# Patient Record
Sex: Female | Born: 1960 | Race: Asian | Hispanic: No | Smoking: Never smoker
Health system: Southern US, Community
[De-identification: ages and names within clinical notes are randomized; demographics above are authoritative.]

## PROBLEM LIST (undated history)

## (undated) DIAGNOSIS — M329 Systemic lupus erythematosus, unspecified: Secondary | ICD-10-CM

## (undated) DIAGNOSIS — E119 Type 2 diabetes mellitus without complications: Secondary | ICD-10-CM

## (undated) DIAGNOSIS — I10 Essential (primary) hypertension: Secondary | ICD-10-CM

## (undated) DIAGNOSIS — G500 Trigeminal neuralgia: Secondary | ICD-10-CM

## (undated) DIAGNOSIS — M1990 Unspecified osteoarthritis, unspecified site: Secondary | ICD-10-CM

## (undated) HISTORY — DX: Systemic lupus erythematosus, unspecified: M32.9

## (undated) HISTORY — DX: Trigeminal neuralgia: G50.0

## (undated) HISTORY — DX: Unspecified osteoarthritis, unspecified site: M19.90

## (undated) HISTORY — DX: Essential (primary) hypertension: I10

## (undated) HISTORY — DX: Type 2 diabetes mellitus without complications: E11.9

---

## 2010-01-21 ENCOUNTER — Encounter: Admission: RE | Admit: 2010-01-21 | Discharge: 2010-01-21 | Payer: Self-pay | Admitting: Cardiovascular Disease

## 2011-07-07 ENCOUNTER — Other Ambulatory Visit: Payer: Self-pay | Admitting: Cardiovascular Disease

## 2011-07-07 ENCOUNTER — Ambulatory Visit
Admission: RE | Admit: 2011-07-07 | Discharge: 2011-07-07 | Disposition: A | Discharge status: Home / Self Care | Payer: No Typology Code available for payment source | Source: Ambulatory Visit | Attending: Cardiovascular Disease | Admitting: Cardiovascular Disease

## 2011-07-07 DIAGNOSIS — R05 Cough: Secondary | ICD-10-CM

## 2011-07-07 DIAGNOSIS — R059 Cough, unspecified: Secondary | ICD-10-CM

## 2014-11-30 ENCOUNTER — Ambulatory Visit (INDEPENDENT_AMBULATORY_CARE_PROVIDER_SITE_OTHER): Payer: 59 | Admitting: Nurse Practitioner

## 2014-11-30 ENCOUNTER — Encounter: Payer: Self-pay | Admitting: Nurse Practitioner

## 2014-11-30 VITALS — BP 128/86 | HR 91 | Temp 97.5°F | Resp 16 | Ht 60.0 in | Wt 199.2 lb

## 2014-11-30 DIAGNOSIS — I10 Essential (primary) hypertension: Secondary | ICD-10-CM

## 2014-11-30 DIAGNOSIS — M25462 Effusion, left knee: Secondary | ICD-10-CM

## 2014-11-30 DIAGNOSIS — E1140 Type 2 diabetes mellitus with diabetic neuropathy, unspecified: Secondary | ICD-10-CM

## 2014-11-30 MED ORDER — TRAMADOL HCL 50 MG PO TABS: 50.0000 mg | ORAL_TABLET | Freq: Four times a day (QID) | ORAL | Status: DC | PRN

## 2014-11-30 NOTE — Patient Instructions (Signed)
Can make your own appt and call and let us know when it is  Follow up in 1 month for EV

## 2014-11-30 NOTE — Progress Notes (Signed)
Patient ID: Victoria Cox, female   DOB: Dec 04, 1960, 54 y.o.   MRN: [id number]    PCP: Sharon Seller, NP  No Known Allergies  Chief Complaint  Patient presents with  . Establish Care    New Patient Establish Care, left knee pain x 2 weeks. Seen at [facility] 11/22/14 , started prednisone x 5 days- completed. No know injury for knee pain. Patient would like referral to specialist  . Labs Only    Fasting for any labs due, patient with DM and HTN      HPI: Patient is a 54 y.o. female seen in the office today to establish care. Pt has lived in the [place] for 14 years, goes back and forth to [place] about every 6 months -1 year and will see a physician there. Getting prescription medications from [place] from family when they visit.   Hx of diabetes, htn, knee pain Fasting today for blood work Pt does not know her family hx but reports her brother has RA   Pt with knee pain, left knee, increased pain 2 weeks ago. no injury noted. Had hx of pain but it was on and off, but 2 weeks was more severe. Went to urgent care and found effusion. 5 day of prednisone given but still with increased pain and swelling.  No fevers   Here with daughter-in-law who reports they have been doing rest, elevation, compression and ice-- she is a Physical therapist   Advanced Directive information Does patient have an advance directive?: No, Would patient like information on creating an advanced directive?: Yes - Educational materials given Review of Systems:  Review of Systems  Constitutional: Negative for activity change, appetite change, fatigue and unexpected weight change.  HENT: Negative for congestion and hearing loss.   Eyes: Negative.   Respiratory: Negative for cough and shortness of breath.   Cardiovascular: Negative for chest pain, palpitations and leg swelling.  Gastrointestinal: Negative for abdominal pain, diarrhea and constipation.  Genitourinary: Negative for dysuria and difficulty urinating.    Musculoskeletal: Positive for joint swelling and arthralgias. Negative for myalgias.  Skin: Negative for color change and wound.  Neurological: Negative for dizziness, weakness, light-headedness and headaches.  Psychiatric/Behavioral: Negative for sleep disturbance and agitation. The patient is not nervous/anxious.     Past Medical History  Diagnosis Date  . High blood pressure   . Diabetes   . Arthritis    History reviewed. No pertinent past surgical history. Social History:   reports that she has never smoked. She does not have any smokeless tobacco history on file. She reports that she does not drink alcohol or use illicit drugs.  Family History  Problem Relation Age of Onset  . Hypertension Mother     Medications: Patient's Medications  New Prescriptions   No medications on file  Previous Medications   ACETAMINOPHEN (TYLENOL) 500 MG TABLET    Take 500 mg by mouth as needed.   IBUPROFEN (ADVIL,MOTRIN) 200 MG TABLET    Take 200 mg by mouth every 6 (six) hours as needed.   METFORMIN HCL PO    Take by mouth. Metformin hydrochloride 250 mg, 1 by mouth in the am, 1 by mouth in the pm   MULTIPLE VITAMIN (MULTIVITAMIN WITH MINERALS) TABS TABLET    Take 1 tablet by mouth daily.   TELMISARTAN (MICARDIS) 20 MG TABLET    Take 20 mg by mouth daily.  Modified Medications   No medications on file  Discontinued Medications   No medications  on file     Physical Exam:  Filed Vitals:   11/30/14 0903  BP: 128/86  Pulse: 91  Temp: 97.5 F (36.4 C)  TempSrc: Oral  Resp: 16  Height: 5' (1.524 m)  Weight: 199 lb 3.2 oz (90.357 kg)  SpO2: 98%    Physical Exam  Constitutional: She is oriented to person, place, and time. She appears well-developed and well-nourished. No distress.  HENT:  Head: Normocephalic and atraumatic.  Mouth/Throat: Oropharynx is clear and moist. No oropharyngeal exudate.  Eyes: Conjunctivae are normal. Pupils are equal, round, and reactive to light.  Neck:  Normal range of motion. Neck supple.  Cardiovascular: Normal rate, regular rhythm and normal heart sounds.   Pulmonary/Chest: Effort normal and breath sounds normal.  Abdominal: Soft. Bowel sounds are normal.  Musculoskeletal: Normal range of motion.       Left knee: She exhibits swelling and effusion. She exhibits normal range of motion, no ecchymosis and no erythema.  Neurological: She is alert and oriented to person, place, and time.  Skin: Skin is warm and dry. She is not diaphoretic.  Psychiatric: She has a normal mood and affect.    Labs reviewed: Basic Metabolic Panel: No results for input(s): NA, K, CL, CO2, GLUCOSE, BUN, CREATININE, CALCIUM, MG, PHOS, TSH in the last 8760 hours. Liver Function Tests: No results for input(s): AST, ALT, ALKPHOS, BILITOT, PROT, ALBUMIN in the last 8760 hours. No results for input(s): LIPASE, AMYLASE in the last 8760 hours. No results for input(s): AMMONIA in the last 8760 hours. CBC: No results for input(s): WBC, NEUTROABS, HGB, HCT, MCV, PLT in the last 8760 hours. Lipid Panel: No results for input(s): CHOL, HDL, LDLCALC, TRIG, CHOLHDL, LDLDIRECT in the last 8760 hours. TSH: No results for input(s): TSH in the last 8760 hours. A1C: No results found for: HGBA1C   Assessment/Plan 1. Knee effusion, left -was seen in urgent care last week due to increase in pain, xray reveal effusion and urgent care recommended ortho referral, prednisone not effective  -may cont aleve twice daily and use ultram for break through pain - Ambulatory referral to Orthopedics - traMADol (ULTRAM) 50 MG tablet; Take 1 tablet (50 mg total) by mouth every 6 (six) hours as needed.  Dispense: 30 tablet; Refill: 0  2. HTN (hypertension), benign -blood pressure at goal today, will follow up labs -to cont telmisartan daily  - Comprehensive metabolic panel - Lipid panel - CBC with Differential/Platelet  3. Type 2 diabetes mellitus with diabetic neuropathy -on metformin  250 mg twice daily - will get Hemoglobin A1c today   Follow up in 1 month for EV with EKG and PAP

## 2014-12-01 LAB — CBC WITH DIFFERENTIAL/PLATELET
BASOS: 0 %
Basophils Absolute: 0 10*3/uL (ref 0.0–0.2)
EOS (ABSOLUTE): 0.2 10*3/uL (ref 0.0–0.4)
EOS: 2 %
HEMATOCRIT: 38.6 % (ref 34.0–46.6)
Hemoglobin: 12.5 g/dL (ref 11.1–15.9)
Immature Grans (Abs): 0 10*3/uL (ref 0.0–0.1)
Immature Granulocytes: 0 %
LYMPHS: 28 %
Lymphocytes Absolute: 2.2 10*3/uL (ref 0.7–3.1)
MCH: 27 pg (ref 26.6–33.0)
MCHC: 32.4 g/dL (ref 31.5–35.7)
MCV: 83 fL (ref 79–97)
MONOS ABS: 0.4 10*3/uL (ref 0.1–0.9)
Monocytes: 5 %
NEUTROS ABS: 5.2 10*3/uL (ref 1.4–7.0)
Neutrophils: 65 %
Platelets: 369 10*3/uL (ref 150–379)
RBC: 4.63 x10E6/uL (ref 3.77–5.28)
RDW: 14.1 % (ref 12.3–15.4)
WBC: 8.1 10*3/uL (ref 3.4–10.8)

## 2014-12-01 LAB — COMPREHENSIVE METABOLIC PANEL
ALBUMIN: 4.2 g/dL (ref 3.5–5.5)
ALK PHOS: 108 IU/L (ref 39–117)
ALT: 36 IU/L — AB (ref 0–32)
AST: 21 IU/L (ref 0–40)
Albumin/Globulin Ratio: 1.4 (ref 1.1–2.5)
BUN/Creatinine Ratio: 19 (ref 9–23)
BUN: 11 mg/dL (ref 6–24)
Bilirubin Total: 0.2 mg/dL (ref 0.0–1.2)
CALCIUM: 9.6 mg/dL (ref 8.7–10.2)
CHLORIDE: 99 mmol/L (ref 97–108)
CO2: 23 mmol/L (ref 18–29)
Creatinine, Ser: 0.59 mg/dL (ref 0.57–1.00)
GFR calc Af Amer: 119 mL/min/{1.73_m2} (ref >59)
GFR calc non Af Amer: 104 mL/min/{1.73_m2} (ref >59)
GLUCOSE: 147 mg/dL — AB (ref 65–99)
Globulin, Total: 3 g/dL (ref 1.5–4.5)
POTASSIUM: 4.5 mmol/L (ref 3.5–5.2)
Sodium: 139 mmol/L (ref 134–144)
TOTAL PROTEIN: 7.2 g/dL (ref 6.0–8.5)

## 2014-12-01 LAB — LIPID PANEL
CHOL/HDL RATIO: 3.5 ratio (ref 0.0–4.4)
Cholesterol, Total: 186 mg/dL (ref 100–199)
HDL: 53 mg/dL (ref >39)
LDL CALC: 69 mg/dL (ref 0–99)
TRIGLYCERIDES: 322 mg/dL — AB (ref 0–149)
VLDL Cholesterol Cal: 64 mg/dL — ABNORMAL HIGH (ref 5–40)

## 2014-12-01 LAB — HEMOGLOBIN A1C
ESTIMATED AVERAGE GLUCOSE: 206 mg/dL
Hgb A1c MFr Bld: 8.8 % — ABNORMAL HIGH (ref 4.8–5.6)

## 2014-12-08 ENCOUNTER — Other Ambulatory Visit: Payer: Self-pay | Admitting: *Deleted

## 2014-12-08 MED ORDER — FREESTYLE LANCETS MISC: Status: AC

## 2014-12-08 MED ORDER — METFORMIN HCL 500 MG PO TABS: ORAL_TABLET | ORAL | Status: DC

## 2014-12-08 MED ORDER — GLUCOSE BLOOD VI STRP: ORAL_STRIP | Status: AC

## 2014-12-08 NOTE — Telephone Encounter (Signed)
Given patient Freestyle Lite Blood sugar machine and instructed on how to use it. Faxed in supplies to pharmacy.

## 2014-12-08 NOTE — Telephone Encounter (Signed)
Per Shanda Bumps due to labs called into pharmacy.

## 2014-12-11 ENCOUNTER — Other Ambulatory Visit: Payer: Self-pay | Admitting: Orthopaedic Surgery

## 2014-12-11 DIAGNOSIS — M25562 Pain in left knee: Secondary | ICD-10-CM

## 2014-12-23 ENCOUNTER — Ambulatory Visit
Admission: RE | Admit: 2014-12-23 | Discharge: 2014-12-23 | Disposition: A | Discharge status: Home / Self Care | Payer: Self-pay | Source: Ambulatory Visit | Attending: Orthopaedic Surgery | Admitting: Orthopaedic Surgery

## 2014-12-23 DIAGNOSIS — M25562 Pain in left knee: Secondary | ICD-10-CM

## 2015-01-01 ENCOUNTER — Other Ambulatory Visit (HOSPITAL_COMMUNITY): Payer: Self-pay | Admitting: *Deleted

## 2015-01-01 DIAGNOSIS — M79662 Pain in left lower leg: Secondary | ICD-10-CM

## 2015-01-01 DIAGNOSIS — M7989 Other specified soft tissue disorders: Principal | ICD-10-CM

## 2015-01-02 ENCOUNTER — Ambulatory Visit (HOSPITAL_COMMUNITY): Admission: RE | Admit: 2015-01-02 | Payer: 59 | Source: Ambulatory Visit

## 2015-01-02 ENCOUNTER — Encounter (HOSPITAL_COMMUNITY): Payer: Self-pay

## 2015-01-09 ENCOUNTER — Ambulatory Visit (INDEPENDENT_AMBULATORY_CARE_PROVIDER_SITE_OTHER): Payer: 59 | Admitting: Nurse Practitioner

## 2015-01-09 ENCOUNTER — Encounter: Payer: Self-pay | Admitting: Nurse Practitioner

## 2015-01-09 VITALS — BP 126/86 | HR 96 | Temp 97.5°F | Resp 18 | Ht 60.0 in | Wt 195.2 lb

## 2015-01-09 DIAGNOSIS — Z0000 Encounter for general adult medical examination without abnormal findings: Secondary | ICD-10-CM | POA: Diagnosis not present

## 2015-01-09 DIAGNOSIS — E119 Type 2 diabetes mellitus without complications: Secondary | ICD-10-CM | POA: Diagnosis not present

## 2015-01-09 DIAGNOSIS — Z1239 Encounter for other screening for malignant neoplasm of breast: Secondary | ICD-10-CM | POA: Diagnosis not present

## 2015-01-09 DIAGNOSIS — Z124 Encounter for screening for malignant neoplasm of cervix: Secondary | ICD-10-CM

## 2015-01-09 MED ORDER — TETANUS-DIPHTH-ACELL PERTUSSIS 5-2-15.5 LF-MCG/0.5 IM SUSP: 0.5000 mL | Freq: Once | INTRAMUSCULAR | Status: DC

## 2015-01-09 MED ORDER — METFORMIN HCL 1000 MG PO TABS: ORAL_TABLET | ORAL | Status: DC

## 2015-01-09 NOTE — Patient Instructions (Addendum)
To follow up in 3 months with lab work prior to appt   Increase metformin to 1000 mg in the am, 500 mg at dinner for 1 week then increase metformin to 1000 mg twice daily for diabetes  Cont diabetic diet  Health Maintenance Adopting a healthy lifestyle and getting preventive care can go a long way to promote health and wellness. Talk with your health care provider about what schedule of regular examinations is right for you. This is a good chance for you to check in with your provider about disease prevention and staying healthy. In between checkups, there are plenty of things you can do on your own. Experts have done a lot of research about which lifestyle changes and preventive measures are most likely to keep you healthy. Ask your health care provider for more information. WEIGHT AND DIET  Eat a healthy diet  Be sure to include plenty of vegetables, fruits, low-fat dairy products, and lean protein.  Do not eat a lot of foods high in solid fats, added sugars, or salt.  Get regular exercise. This is one of the most important things you can do for your health.  Most adults should exercise for at least 150 minutes each week. The exercise should increase your heart rate and make you sweat (moderate-intensity exercise).  Most adults should also do strengthening exercises at least twice a week. This is in addition to the moderate-intensity exercise.  Maintain a healthy weight  Body mass index (BMI) is a measurement that can be used to identify possible weight problems. It estimates body fat based on height and weight. Your health care provider can help determine your BMI and help you achieve or maintain a healthy weight.  For females 53 years of age and older:   A BMI below 18.5 is considered underweight.  A BMI of 18.5 to 24.9 is normal.  A BMI of 25 to 29.9 is considered overweight.  A BMI of 30 and above is considered obese.  Watch levels of cholesterol and blood lipids  You  should start having your blood tested for lipids and cholesterol at 55 years of age, then have this test every 5 years.  You may need to have your cholesterol levels checked more often if:  Your lipid or cholesterol levels are high.  You are older than 55 years of age.  You are at high risk for heart disease.  CANCER SCREENING   Lung Cancer  Lung cancer screening is recommended for adults 48-70 years old who are at high risk for lung cancer because of a history of smoking.  A yearly low-dose CT scan of the lungs is recommended for people who:  Currently smoke.  Have quit within the past 15 years.  Have at least a 30-pack-year history of smoking. A pack year is smoking an average of one pack of cigarettes a day for 1 year.  Yearly screening should continue until it has been 15 years since you quit.  Yearly screening should stop if you develop a health problem that would prevent you from having lung cancer treatment.  Breast Cancer  Practice breast self-awareness. This means understanding how your breasts normally appear and feel.  It also means doing regular breast self-exams. Let your health care provider know about any changes, no matter how small.  If you are in your 20s or 30s, you should have a clinical breast exam (CBE) by a health care provider every 1-3 years as part of a regular health exam.  If you are 40 or older, have a CBE every year. Also consider having a breast X-ray (mammogram) every year.  If you have a family history of breast cancer, talk to your health care provider about genetic screening.  If you are at high risk for breast cancer, talk to your health care provider about having an MRI and a mammogram every year.  Breast cancer gene (BRCA) assessment is recommended for women who have family members with BRCA-related cancers. BRCA-related cancers include:  Breast.  Ovarian.  Tubal.  Peritoneal cancers.  Results of the assessment will determine  the need for genetic counseling and BRCA1 and BRCA2 testing. Cervical Cancer Routine pelvic examinations to screen for cervical cancer are no longer recommended for nonpregnant women who are considered low risk for cancer of the pelvic organs (ovaries, uterus, and vagina) and who do not have symptoms. A pelvic examination may be necessary if you have symptoms including those associated with pelvic infections. Ask your health care provider if a screening pelvic exam is right for you.   The Pap test is the screening test for cervical cancer for women who are considered at risk.  If you had a hysterectomy for a problem that was not cancer or a condition that could lead to cancer, then you no longer need Pap tests.  If you are older than 65 years, and you have had normal Pap tests for the past 10 years, you no longer need to have Pap tests.  If you have had past treatment for cervical cancer or a condition that could lead to cancer, you need Pap tests and screening for cancer for at least 20 years after your treatment.  If you no longer get a Pap test, assess your risk factors if they change (such as having a new sexual partner). This can affect whether you should start being screened again.  Some women have medical problems that increase their chance of getting cervical cancer. If this is the case for you, your health care provider may recommend more frequent screening and Pap tests.  The human papillomavirus (HPV) test is another test that may be used for cervical cancer screening. The HPV test looks for the virus that can cause cell changes in the cervix. The cells collected during the Pap test can be tested for HPV.  The HPV test can be used to screen women 59 years of age and older. Getting tested for HPV can extend the interval between normal Pap tests from three to five years.  An HPV test also should be used to screen women of any age who have unclear Pap test results.  After 55 years of  age, women should have HPV testing as often as Pap tests.  Colorectal Cancer  This type of cancer can be detected and often prevented.  Routine colorectal cancer screening usually begins at 55 years of age and continues through 55 years of age.  Your health care provider may recommend screening at an earlier age if you have risk factors for colon cancer.  Your health care provider may also recommend using home test kits to check for hidden blood in the stool.  A small camera at the end of a tube can be used to examine your colon directly (sigmoidoscopy or colonoscopy). This is done to check for the earliest forms of colorectal cancer.  Routine screening usually begins at age 21.  Direct examination of the colon should be repeated every 5-10 years through 55 years of age. However,  you may need to be screened more often if early forms of precancerous polyps or small growths are found. Skin Cancer  Check your skin from head to toe regularly.  Tell your health care provider about any new moles or changes in moles, especially if there is a change in a mole's shape or color.  Also tell your health care provider if you have a mole that is larger than the size of a pencil eraser.  Always use sunscreen. Apply sunscreen liberally and repeatedly throughout the day.  Protect yourself by wearing long sleeves, pants, a wide-brimmed hat, and sunglasses whenever you are outside. HEART DISEASE, DIABETES, AND HIGH BLOOD PRESSURE   Have your blood pressure checked at least every 1-2 years. High blood pressure causes heart disease and increases the risk of stroke.  If you are between 57 years and 109 years old, ask your health care provider if you should take aspirin to prevent strokes.  Have regular diabetes screenings. This involves taking a blood sample to check your fasting blood sugar level.  If you are at a normal weight and have a low risk for diabetes, have this test once every three years  after 55 years of age.  If you are overweight and have a high risk for diabetes, consider being tested at a younger age or more often. PREVENTING INFECTION  Hepatitis B  If you have a higher risk for hepatitis B, you should be screened for this virus. You are considered at high risk for hepatitis B if:  You were born in a country where hepatitis B is common. Ask your health care provider which countries are considered high risk.  Your parents were born in a high-risk country, and you have not been immunized against hepatitis B (hepatitis B vaccine).  You have HIV or AIDS.  You use needles to inject street drugs.  You live with someone who has hepatitis B.  You have had sex with someone who has hepatitis B.  You get hemodialysis treatment.  You take certain medicines for conditions, including cancer, organ transplantation, and autoimmune conditions. Hepatitis C  Blood testing is recommended for:  Everyone born from [birth year] through 1966.  Anyone with known risk factors for hepatitis C. Sexually transmitted infections (STIs)  You should be screened for sexually transmitted infections (STIs) including gonorrhea and chlamydia if:  You are sexually active and are younger than 55 years of age.  You are older than 55 years of age and your health care provider tells you that you are at risk for this type of infection.  Your sexual activity has changed since you were last screened and you are at an increased risk for chlamydia or gonorrhea. Ask your health care provider if you are at risk.  If you do not have HIV, but are at risk, it may be recommended that you take a prescription medicine daily to prevent HIV infection. This is called pre-exposure prophylaxis (PrEP). You are considered at risk if:  You are sexually active and do not regularly use condoms or know the HIV status of your partner(s).  You take drugs by injection.  You are sexually active with a partner who has  HIV. Talk with your health care provider about whether you are at high risk of being infected with HIV. If you choose to begin PrEP, you should first be tested for HIV. You should then be tested every 3 months for as long as you are taking PrEP.  PREGNANCY   If  you are premenopausal and you may become pregnant, ask your health care provider about preconception counseling.  If you may become pregnant, take 400 to 800 micrograms (mcg) of folic acid every day.  If you want to prevent pregnancy, talk to your health care provider about birth control (contraception). OSTEOPOROSIS AND MENOPAUSE   Osteoporosis is a disease in which the bones lose minerals and strength with aging. This can result in serious bone fractures. Your risk for osteoporosis can be identified using a bone density scan.  If you are 41 years of age or older, or if you are at risk for osteoporosis and fractures, ask your health care provider if you should be screened.  Ask your health care provider whether you should take a calcium or vitamin D supplement to lower your risk for osteoporosis.  Menopause may have certain physical symptoms and risks.  Hormone replacement therapy may reduce some of these symptoms and risks. Talk to your health care provider about whether hormone replacement therapy is right for you.  HOME CARE INSTRUCTIONS   Schedule regular health, dental, and eye exams.  Stay current with your immunizations.   Do not use any tobacco products including cigarettes, chewing tobacco, or electronic cigarettes.  If you are pregnant, do not drink alcohol.  If you are breastfeeding, limit how much and how often you drink alcohol.  Limit alcohol intake to no more than 1 drink per day for nonpregnant women. One drink equals 12 ounces of beer, 5 ounces of wine, or 1 ounces of hard liquor.  Do not use street drugs.  Do not share needles.  Ask your health care provider for help if you need support or  information about quitting drugs.  Tell your health care provider if you often feel depressed.  Tell your health care provider if you have ever been abused or do not feel safe at home. Document Released: 01/27/2011 Document Revised: 11/28/2013 Document Reviewed: 06/15/2013 [facility] Patient Information 2015 [place], [place]. This information is not intended to replace advice given to you by your health care provider. Make sure you discuss any questions you have with your health care provider.

## 2015-01-09 NOTE — Progress Notes (Signed)
Patient ID: Victoria Cox, female   DOB: 12-Jul-1961, 54 y.o.   MRN: [id number]    PCP: Sharon Seller, NP  No Known Allergies  Chief Complaint  Patient presents with  . Annual Exam    Annual exam     HPI: Patient is a 54 y.o. female seen in the office today to for wellness exam.  Has gone to ortho, fount to have chronic ACL tear, meniscus tear and OA. Given cortisone shot which helped with the pain. Tramadol made her nauseated, taking tylenol   Taking blood sugars at home, fasting between 130-140.  Been trying to be more complaint with diabetic diet.  No low blood sugars noted.    Screenings: Colon Cancer- never had screening  Breast Cancer- never had mammogram  Cervical Cancer- never had, needs, willing to get today   Vaccines Up to date on:  Gets influenza yearly ,  Need:  Tdap  Smoking status: nonsmoker Alcohol use: does not drink alcohol  Dentist: does not go regularly  Ophthalmologist: has not gone due to coverage   Exercise regimen: limited due to knee pain.  Diet: diabetic   Review of Systems:  Review of Systems  Constitutional: Negative for activity change, appetite change, fatigue and unexpected weight change.  HENT: Negative for congestion and hearing loss.   Eyes: Negative.   Respiratory: Negative for cough and shortness of breath.   Cardiovascular: Negative for chest pain, palpitations and leg swelling.  Gastrointestinal: Negative for abdominal pain, diarrhea and constipation.  Endocrine: Negative.   Genitourinary: Negative for dysuria and difficulty urinating.  Musculoskeletal: Positive for joint swelling and arthralgias. Negative for myalgias.  Skin: Negative for color change and wound.  Allergic/Immunologic: Negative.   Neurological: Negative for dizziness, weakness, light-headedness and headaches.  Psychiatric/Behavioral: Negative for sleep disturbance and agitation. The patient is not nervous/anxious.     Past Medical History  Diagnosis  Date  . High blood pressure   . Diabetes   . Arthritis    History reviewed. No pertinent past surgical history. Social History:   reports that she has never smoked. She has never used smokeless tobacco. She reports that she does not drink alcohol or use illicit drugs.  Family History  Problem Relation Age of Onset  . Hypertension Mother     Medications: Patient's Medications  New Prescriptions   No medications on file  Previous Medications   ACETAMINOPHEN (TYLENOL) 500 MG TABLET    Take 500 mg by mouth as needed.   GLUCOSE BLOOD (FREESTYLE LITE) TEST STRIP    Use to test blood sugar twice daily Dx: E11.9   IBUPROFEN (ADVIL,MOTRIN) 200 MG TABLET    Take 200 mg by mouth every 6 (six) hours as needed.   LANCETS (FREESTYLE) LANCETS    Use to test blood sugar twice daily. Dx: E11.9   METFORMIN (GLUCOPHAGE) 500 MG TABLET    Take one tablet by mouth twice daily with meals to control blood sugar   MULTIPLE VITAMIN (MULTIVITAMIN WITH MINERALS) TABS TABLET    Take 1 tablet by mouth daily.   TELMISARTAN (MICARDIS) 20 MG TABLET    Take 20 mg by mouth daily.   TRAMADOL (ULTRAM) 50 MG TABLET    Take 1 tablet (50 mg total) by mouth every 6 (six) hours as needed.  Modified Medications   No medications on file  Discontinued Medications   No medications on file     Physical Exam:  Filed Vitals:   01/09/15 0904  BP: 126/86  Pulse: 96  Temp: 97.5 F (36.4 C)  TempSrc: Oral  Resp: 18  Height: 5' (1.524 m)  Weight: 195 lb 3.2 oz (88.542 kg)  SpO2: 95%    Physical Exam  Constitutional: She is oriented to person, place, and time. She appears well-developed and well-nourished. No distress.  HENT:  Head: Normocephalic and atraumatic.  Mouth/Throat: Oropharynx is clear and moist. No oropharyngeal exudate.  Eyes: Conjunctivae are normal. Pupils are equal, round, and reactive to light.  Neck: Normal range of motion. Neck supple.  Cardiovascular: Normal rate, regular rhythm and normal heart  sounds.   Pulmonary/Chest: Effort normal and breath sounds normal.  Abdominal: Soft. Bowel sounds are normal.  Genitourinary: Vagina normal and uterus normal. Cervix exhibits friability. Right adnexum displays no mass. Left adnexum displays no mass. No vaginal discharge found.  Musculoskeletal: Normal range of motion.  Neurological: She is alert and oriented to person, place, and time. She has normal reflexes.  Skin: Skin is warm and dry. She is not diaphoretic.  Psychiatric: She has a normal mood and affect. Her behavior is normal.    Labs reviewed: Basic Metabolic Panel:  Recent Labs  70/96/28 1001  NA 139  K 4.5  CL 99  CO2 23  GLUCOSE 147*  BUN 11  CREATININE 0.59  CALCIUM 9.6   Liver Function Tests:  Recent Labs  11/30/14 1001  AST 21  ALT 36*  ALKPHOS 108  BILITOT 0.2  PROT 7.2   No results for input(s): LIPASE, AMYLASE in the last 8760 hours. No results for input(s): AMMONIA in the last 8760 hours. CBC:  Recent Labs  11/30/14 1001  WBC 8.1  NEUTROABS 5.2  HCT 38.6   Lipid Panel:  Recent Labs  11/30/14 1001  CHOL 186  HDL 53  LDLCALC 69  TRIG 322*  CHOLHDL 3.5   TSH: No results for input(s): TSH in the last 8760 hours. A1C: Lab Results  Component Value Date   HGBA1C 8.8* 11/30/2014     Assessment/Plan  1. Type 2 diabetes mellitus without complication -discussed diet and exercise - Ambulatory referral to Ophthalmology - Hemoglobin A1c; Future - Basic metabolic panel; Future - metFORMIN (GLUCOPHAGE) 1000 MG tablet; Take one tablet by mouth twice daily with meals to control blood sugar  Dispense: 60 tablet; Refill: 3  2. Screening for breast cancer - MM DIGITAL SCREENING BILATERAL; Future  3. Cervical cancer screening - PAP, Image Guided [[facility], [facility]]  4. Preventative health care -no new findings on exam.  -PREVENTIVE COUNSELING:  The patient was counseled regarding the appropriate use of regular self-examination of the  breasts on a monthly basis, prevention of dental and periodontal disease, diet, regular sustained exercise for at least 30 minutes 5 times per week, routine screening interval for mammogram as recommended by the American Cancer Society and ACOG, importance of regular PAP smears, and recommended schedule for GI hemoccult testing, colonoscopy, cholesterol, thyroid and diabetes screening. -information given on colo-guard -rx given for Tdap vaccine  Follow up in 3 months with lab work prior to visit with Dr Montez Morita

## 2015-01-16 LAB — SPECIMEN STATUS REPORT

## 2015-01-16 LAB — GYN REPORT: PAP Smear Comment: 0

## 2015-01-17 ENCOUNTER — Encounter: Payer: Self-pay | Admitting: Nurse Practitioner

## 2015-01-19 ENCOUNTER — Other Ambulatory Visit: Payer: Self-pay | Admitting: Nurse Practitioner

## 2015-02-15 ENCOUNTER — Ambulatory Visit: Payer: 59

## 2015-03-20 ENCOUNTER — Telehealth: Payer: Self-pay | Admitting: Nurse Practitioner

## 2015-03-20 NOTE — Telephone Encounter (Signed)
FYI - Patient was a no show for her mammogram Tried calling patient a few times to follow up - Patient not returning my calls   CC'd Synetta Fail for data entry

## 2015-03-21 NOTE — Telephone Encounter (Signed)
Notation made under health maintenance. Patient with pending appointment on 04/18/15 with Dr.Carter.

## 2015-04-16 ENCOUNTER — Other Ambulatory Visit: Payer: 59

## 2015-04-18 ENCOUNTER — Encounter: Payer: Self-pay | Admitting: Nurse Practitioner

## 2015-04-18 ENCOUNTER — Ambulatory Visit: Payer: 59 | Admitting: Internal Medicine

## 2015-05-16 ENCOUNTER — Telehealth: Payer: Self-pay

## 2015-05-16 NOTE — Telephone Encounter (Signed)
-----   Message from Sharon Seller, NP sent at 05/16/2015  9:49 AM EDT ----- Pt overdue for labs and has no follow up appt, please have pt schedule appt with BMP and A1c prior to visit   ----- Message -----    From: SYSTEM    Sent: 05/16/2015  12:04 AM      To: Sharon Seller, NP

## 2015-05-16 NOTE — Telephone Encounter (Signed)
Left message on machine for patient to return call when available   

## 2015-05-21 NOTE — Telephone Encounter (Signed)
Left message on machine for patient to return call when available   

## 2015-07-03 ENCOUNTER — Telehealth: Payer: Self-pay

## 2015-07-03 NOTE — Telephone Encounter (Signed)
Left message on machine for patient to return call when available   

## 2015-07-03 NOTE — Telephone Encounter (Signed)
-----   Message from Sharon Seller, NP sent at 07/02/2015 10:02 AM EST ----- Is Victoria Cox still a patient at our office? Missed follow up and lab work. Please call to confirm and schedule appt if she is still being seen at our office   ----- Message -----    From: SYSTEM    Sent: 07/02/2015  12:04 AM      To: Sharon Seller, NP

## 2015-11-20 ENCOUNTER — Other Ambulatory Visit: Payer: Self-pay | Admitting: Cardiovascular Disease

## 2015-11-20 DIAGNOSIS — R519 Headache, unspecified: Secondary | ICD-10-CM

## 2015-11-20 DIAGNOSIS — R51 Headache: Principal | ICD-10-CM

## 2015-11-20 DIAGNOSIS — J3489 Other specified disorders of nose and nasal sinuses: Secondary | ICD-10-CM

## 2015-11-21 ENCOUNTER — Other Ambulatory Visit: Payer: Self-pay

## 2015-11-22 ENCOUNTER — Ambulatory Visit
Admission: RE | Admit: 2015-11-22 | Discharge: 2015-11-22 | Disposition: A | Discharge status: Home / Self Care | Payer: BLUE CROSS/BLUE SHIELD | Source: Ambulatory Visit | Attending: Cardiovascular Disease | Admitting: Cardiovascular Disease

## 2015-11-22 DIAGNOSIS — J3489 Other specified disorders of nose and nasal sinuses: Secondary | ICD-10-CM

## 2015-11-22 DIAGNOSIS — R51 Headache: Principal | ICD-10-CM

## 2015-11-22 DIAGNOSIS — R519 Headache, unspecified: Secondary | ICD-10-CM

## 2017-05-19 ENCOUNTER — Other Ambulatory Visit: Payer: Self-pay | Admitting: Cardiovascular Disease

## 2017-05-19 ENCOUNTER — Ambulatory Visit
Admission: RE | Admit: 2017-05-19 | Discharge: 2017-05-19 | Disposition: A | Discharge status: Home / Self Care | Payer: Self-pay | Source: Ambulatory Visit | Attending: Cardiovascular Disease | Admitting: Cardiovascular Disease

## 2017-05-19 DIAGNOSIS — M542 Cervicalgia: Secondary | ICD-10-CM

## 2017-05-19 DIAGNOSIS — R52 Pain, unspecified: Secondary | ICD-10-CM

## 2019-02-12 ENCOUNTER — Emergency Department (HOSPITAL_COMMUNITY): Payer: Medicaid Other

## 2019-02-12 ENCOUNTER — Other Ambulatory Visit: Payer: Self-pay

## 2019-02-12 ENCOUNTER — Encounter (HOSPITAL_COMMUNITY): Payer: Self-pay | Admitting: Emergency Medicine

## 2019-02-12 ENCOUNTER — Inpatient Hospital Stay (HOSPITAL_COMMUNITY)
Admission: EM | Admit: 2019-02-12 | Discharge: 2019-02-21 | DRG: 097 | Disposition: A | Discharge status: Home Health | Payer: Medicaid Other | Attending: Cardiovascular Disease | Admitting: Cardiovascular Disease

## 2019-02-12 DIAGNOSIS — R531 Weakness: Secondary | ICD-10-CM | POA: Diagnosis present

## 2019-02-12 DIAGNOSIS — R5381 Other malaise: Secondary | ICD-10-CM | POA: Diagnosis not present

## 2019-02-12 DIAGNOSIS — A86 Unspecified viral encephalitis: Principal | ICD-10-CM | POA: Diagnosis present

## 2019-02-12 DIAGNOSIS — J9690 Respiratory failure, unspecified, unspecified whether with hypoxia or hypercapnia: Secondary | ICD-10-CM

## 2019-02-12 DIAGNOSIS — E876 Hypokalemia: Secondary | ICD-10-CM | POA: Diagnosis present

## 2019-02-12 DIAGNOSIS — R4701 Aphasia: Secondary | ICD-10-CM | POA: Diagnosis present

## 2019-02-12 DIAGNOSIS — E039 Hypothyroidism, unspecified: Secondary | ICD-10-CM | POA: Diagnosis present

## 2019-02-12 DIAGNOSIS — Z20828 Contact with and (suspected) exposure to other viral communicable diseases: Secondary | ICD-10-CM | POA: Diagnosis present

## 2019-02-12 DIAGNOSIS — G9341 Metabolic encephalopathy: Secondary | ICD-10-CM | POA: Diagnosis present

## 2019-02-12 DIAGNOSIS — R059 Cough, unspecified: Secondary | ICD-10-CM

## 2019-02-12 DIAGNOSIS — R627 Adult failure to thrive: Secondary | ICD-10-CM | POA: Diagnosis present

## 2019-02-12 DIAGNOSIS — G9340 Encephalopathy, unspecified: Secondary | ICD-10-CM | POA: Diagnosis not present

## 2019-02-12 DIAGNOSIS — Z794 Long term (current) use of insulin: Secondary | ICD-10-CM

## 2019-02-12 DIAGNOSIS — I639 Cerebral infarction, unspecified: Secondary | ICD-10-CM | POA: Diagnosis present

## 2019-02-12 DIAGNOSIS — G0490 Encephalitis and encephalomyelitis, unspecified: Secondary | ICD-10-CM | POA: Diagnosis not present

## 2019-02-12 DIAGNOSIS — E871 Hypo-osmolality and hyponatremia: Secondary | ICD-10-CM | POA: Diagnosis present

## 2019-02-12 DIAGNOSIS — Z79890 Hormone replacement therapy (postmenopausal): Secondary | ICD-10-CM | POA: Diagnosis not present

## 2019-02-12 DIAGNOSIS — R443 Hallucinations, unspecified: Secondary | ICD-10-CM | POA: Diagnosis not present

## 2019-02-12 DIAGNOSIS — Z79899 Other long term (current) drug therapy: Secondary | ICD-10-CM

## 2019-02-12 DIAGNOSIS — R29707 NIHSS score 7: Secondary | ICD-10-CM | POA: Diagnosis present

## 2019-02-12 DIAGNOSIS — E1151 Type 2 diabetes mellitus with diabetic peripheral angiopathy without gangrene: Secondary | ICD-10-CM | POA: Diagnosis present

## 2019-02-12 DIAGNOSIS — Z7952 Long term (current) use of systemic steroids: Secondary | ICD-10-CM

## 2019-02-12 DIAGNOSIS — R0689 Other abnormalities of breathing: Secondary | ICD-10-CM

## 2019-02-12 DIAGNOSIS — K5901 Slow transit constipation: Secondary | ICD-10-CM | POA: Diagnosis present

## 2019-02-12 DIAGNOSIS — E860 Dehydration: Secondary | ICD-10-CM | POA: Diagnosis present

## 2019-02-12 DIAGNOSIS — R05 Cough: Secondary | ICD-10-CM

## 2019-02-12 DIAGNOSIS — M62838 Other muscle spasm: Secondary | ICD-10-CM | POA: Diagnosis present

## 2019-02-12 DIAGNOSIS — I10 Essential (primary) hypertension: Secondary | ICD-10-CM | POA: Diagnosis present

## 2019-02-12 DIAGNOSIS — R29810 Facial weakness: Secondary | ICD-10-CM | POA: Diagnosis present

## 2019-02-12 DIAGNOSIS — J9600 Acute respiratory failure, unspecified whether with hypoxia or hypercapnia: Secondary | ICD-10-CM | POA: Diagnosis present

## 2019-02-12 DIAGNOSIS — R5383 Other fatigue: Secondary | ICD-10-CM | POA: Diagnosis not present

## 2019-02-12 DIAGNOSIS — M0600 Rheumatoid arthritis without rheumatoid factor, unspecified site: Secondary | ICD-10-CM | POA: Diagnosis present

## 2019-02-12 DIAGNOSIS — M791 Myalgia, unspecified site: Secondary | ICD-10-CM

## 2019-02-12 DIAGNOSIS — R451 Restlessness and agitation: Secondary | ICD-10-CM | POA: Diagnosis not present

## 2019-02-12 DIAGNOSIS — R109 Unspecified abdominal pain: Secondary | ICD-10-CM

## 2019-02-12 DIAGNOSIS — Z95828 Presence of other vascular implants and grafts: Secondary | ICD-10-CM | POA: Diagnosis not present

## 2019-02-12 DIAGNOSIS — R4182 Altered mental status, unspecified: Secondary | ICD-10-CM | POA: Diagnosis not present

## 2019-02-12 DIAGNOSIS — I63139 Cerebral infarction due to embolism of unspecified carotid artery: Secondary | ICD-10-CM

## 2019-02-12 DIAGNOSIS — R509 Fever, unspecified: Secondary | ICD-10-CM | POA: Diagnosis not present

## 2019-02-12 DIAGNOSIS — E785 Hyperlipidemia, unspecified: Secondary | ICD-10-CM | POA: Diagnosis present

## 2019-02-12 DIAGNOSIS — M542 Cervicalgia: Secondary | ICD-10-CM | POA: Diagnosis not present

## 2019-02-12 DIAGNOSIS — L989 Disorder of the skin and subcutaneous tissue, unspecified: Secondary | ICD-10-CM | POA: Diagnosis not present

## 2019-02-12 LAB — COMPREHENSIVE METABOLIC PANEL
ALT: 20 U/L (ref 0–44)
AST: 19 U/L (ref 15–41)
Albumin: 3.5 g/dL (ref 3.5–5.0)
Alkaline Phosphatase: 87 U/L (ref 38–126)
Anion gap: 14 (ref 5–15)
BUN: 18 mg/dL (ref 6–20)
CO2: 22 mmol/L (ref 22–32)
Calcium: 9.3 mg/dL (ref 8.9–10.3)
Chloride: 96 mmol/L — ABNORMAL LOW (ref 98–111)
Creatinine, Ser: 0.67 mg/dL (ref 0.44–1.00)
GFR calc Af Amer: >60 mL/min (ref >60)
GFR calc non Af Amer: >60 mL/min (ref >60)
Glucose, Bld: 158 mg/dL — ABNORMAL HIGH (ref 70–99)
Potassium: 3.9 mmol/L (ref 3.5–5.1)
Sodium: 132 mmol/L — ABNORMAL LOW (ref 135–145)
Total Bilirubin: 0.4 mg/dL (ref 0.3–1.2)
Total Protein: 8.1 g/dL (ref 6.5–8.1)

## 2019-02-12 LAB — CBC WITH DIFFERENTIAL/PLATELET
Abs Immature Granulocytes: 0.05 10*3/uL (ref 0.00–0.07)
Basophils Absolute: 0 10*3/uL (ref 0.0–0.1)
Basophils Relative: 0 %
Eosinophils Absolute: 0 10*3/uL (ref 0.0–0.5)
Eosinophils Relative: 0 %
HCT: 39 % (ref 36.0–46.0)
Hemoglobin: 12.4 g/dL (ref 12.0–15.0)
Immature Granulocytes: 1 %
Lymphocytes Relative: 16 %
Lymphs Abs: 1.4 10*3/uL (ref 0.7–4.0)
MCH: 27.5 pg (ref 26.0–34.0)
MCHC: 31.8 g/dL (ref 30.0–36.0)
MCV: 86.5 fL (ref 80.0–100.0)
Monocytes Absolute: 0.8 10*3/uL (ref 0.1–1.0)
Monocytes Relative: 9 %
Neutro Abs: 6.5 10*3/uL (ref 1.7–7.7)
Neutrophils Relative %: 74 %
Platelets: 396 10*3/uL (ref 150–400)
RBC: 4.51 MIL/uL (ref 3.87–5.11)
RDW: 15 % (ref 11.5–15.5)
WBC: 8.9 10*3/uL (ref 4.0–10.5)
nRBC: 0 % (ref 0.0–0.2)

## 2019-02-12 LAB — HEMOGLOBIN A1C
Hgb A1c MFr Bld: 7.1 % — ABNORMAL HIGH (ref 4.8–5.6)
Mean Plasma Glucose: 157.07 mg/dL

## 2019-02-12 LAB — GLUCOSE, CAPILLARY: Glucose-Capillary: 156 mg/dL — ABNORMAL HIGH (ref 70–99)

## 2019-02-12 LAB — SARS CORONAVIRUS 2 BY RT PCR (HOSPITAL ORDER, PERFORMED IN ~~LOC~~ HOSPITAL LAB): SARS Coronavirus 2: NEGATIVE

## 2019-02-12 LAB — LACTIC ACID, PLASMA: Lactic Acid, Venous: 1.3 mmol/L (ref 0.5–1.9)

## 2019-02-12 MED ORDER — ENOXAPARIN SODIUM 40 MG/0.4ML ~~LOC~~ SOLN
40.0000 mg | Freq: Every day | SUBCUTANEOUS | Status: DC
  Administered 2019-02-13 – 2019-02-18 (×5): 40 mg via SUBCUTANEOUS
  Filled 2019-02-12 (×7): qty 0.4

## 2019-02-12 MED ORDER — LEVOTHYROXINE SODIUM 75 MCG PO TABS
75.0000 ug | ORAL_TABLET | Freq: Every day | ORAL | Status: DC
  Administered 2019-02-13 – 2019-02-21 (×8): 75 ug via ORAL
  Filled 2019-02-12 (×8): qty 1

## 2019-02-12 MED ORDER — IRBESARTAN 150 MG PO TABS
75.0000 mg | ORAL_TABLET | Freq: Every day | ORAL | Status: DC
  Administered 2019-02-13 – 2019-02-21 (×9): 75 mg via ORAL
  Filled 2019-02-12 (×9): qty 1

## 2019-02-12 MED ORDER — SODIUM CHLORIDE 0.9 % IV SOLN
1.0000 g | Freq: Every day | INTRAVENOUS | Status: DC
  Administered 2019-02-12 – 2019-02-14 (×3): 1 g via INTRAVENOUS
  Administered 2019-02-15: 2 g via INTRAVENOUS
  Filled 2019-02-12: qty 10
  Filled 2019-02-12 (×4): qty 1

## 2019-02-12 MED ORDER — ENSURE ENLIVE PO LIQD
237.0000 mL | Freq: Two times a day (BID) | ORAL | Status: DC
  Administered 2019-02-14: 10:00:00 237 mL via ORAL

## 2019-02-12 MED ORDER — PANTOPRAZOLE SODIUM 40 MG PO TBEC
40.0000 mg | DELAYED_RELEASE_TABLET | Freq: Every day | ORAL | Status: DC
  Administered 2019-02-13 – 2019-02-15 (×3): 40 mg via ORAL
  Filled 2019-02-12 (×3): qty 1

## 2019-02-12 MED ORDER — SODIUM CHLORIDE 0.9 % IV SOLN
INTRAVENOUS | Status: DC
  Administered 2019-02-12 – 2019-02-13 (×2): via INTRAVENOUS

## 2019-02-12 MED ORDER — METOPROLOL SUCCINATE ER 25 MG PO TB24
50.0000 mg | ORAL_TABLET | Freq: Every day | ORAL | Status: DC
  Administered 2019-02-13 – 2019-02-21 (×9): 50 mg via ORAL
  Filled 2019-02-12 (×2): qty 2
  Filled 2019-02-12 (×3): qty 1
  Filled 2019-02-12: qty 2
  Filled 2019-02-12 (×2): qty 1
  Filled 2019-02-12: qty 2

## 2019-02-12 MED ORDER — INSULIN ASPART 100 UNIT/ML ~~LOC~~ SOLN
0.0000 [IU] | Freq: Three times a day (TID) | SUBCUTANEOUS | Status: DC
  Administered 2019-02-13 – 2019-02-14 (×3): 1 [IU] via SUBCUTANEOUS
  Administered 2019-02-14: 08:00:00 2 [IU] via SUBCUTANEOUS
  Administered 2019-02-14: 12:00:00 1 [IU] via SUBCUTANEOUS
  Administered 2019-02-15: 2 [IU] via SUBCUTANEOUS
  Administered 2019-02-15 – 2019-02-16 (×4): 1 [IU] via SUBCUTANEOUS
  Administered 2019-02-17: 2 [IU] via SUBCUTANEOUS
  Administered 2019-02-17 – 2019-02-18 (×3): 1 [IU] via SUBCUTANEOUS
  Administered 2019-02-18: 14:00:00 3 [IU] via SUBCUTANEOUS
  Administered 2019-02-18: 1 [IU] via SUBCUTANEOUS
  Administered 2019-02-19: 2 [IU] via SUBCUTANEOUS
  Administered 2019-02-19 – 2019-02-20 (×3): 1 [IU] via SUBCUTANEOUS
  Administered 2019-02-20: 2 [IU] via SUBCUTANEOUS
  Administered 2019-02-20: 1 [IU] via SUBCUTANEOUS
  Administered 2019-02-21: 13:00:00 3 [IU] via SUBCUTANEOUS

## 2019-02-12 MED ORDER — FENTANYL CITRATE (PF) 100 MCG/2ML IJ SOLN
50.0000 ug | Freq: Once | INTRAMUSCULAR | Status: AC
  Administered 2019-02-12: 50 ug via INTRAVENOUS
  Filled 2019-02-12: qty 2

## 2019-02-12 MED ORDER — ADULT MULTIVITAMIN W/MINERALS CH
1.0000 | ORAL_TABLET | Freq: Every day | ORAL | Status: DC
  Administered 2019-02-13 – 2019-02-21 (×8): 1 via ORAL
  Filled 2019-02-12 (×8): qty 1

## 2019-02-12 MED ORDER — FOLIC ACID 1 MG PO TABS
2.0000 mg | ORAL_TABLET | Freq: Every day | ORAL | Status: DC
  Administered 2019-02-13 – 2019-02-21 (×7): 2 mg via ORAL
  Filled 2019-02-12 (×9): qty 2

## 2019-02-12 MED ORDER — SODIUM CHLORIDE 0.9 % IV BOLUS
1000.0000 mL | Freq: Once | INTRAVENOUS | Status: AC
  Administered 2019-02-12: 19:00:00 1000 mL via INTRAVENOUS

## 2019-02-12 MED ORDER — MIDAZOLAM HCL 2 MG/2ML IJ SOLN
1.0000 mg | Freq: Once | INTRAMUSCULAR | Status: AC
  Administered 2019-02-12: 1 mg via INTRAVENOUS
  Filled 2019-02-12: qty 2

## 2019-02-12 MED ORDER — ASPIRIN EC 81 MG PO TBEC
81.0000 mg | DELAYED_RELEASE_TABLET | Freq: Every day | ORAL | Status: DC
  Filled 2019-02-12 (×2): qty 1

## 2019-02-12 NOTE — Progress Notes (Signed)
The pt's daughter brought in methotrexate 2.5mg  that pt takes once a week (usually on Fridays) and pt is due to start taking it this week.

## 2019-02-12 NOTE — ED Provider Notes (Signed)
[facility]-EMERGENCY DEPT Provider Note   CSN: [id number] Arrival date & time: 02/12/19  1714    History   Chief Complaint Chief Complaint  Patient presents with  . Weakness  . Neck Pain    HPI Victoria Cox is a 58 y.o. female.     Patient is a 58 year old female with a history of diabetes, hypertension and rheumatoid arthritis who presents with weakness.  She does not speak [place] and her daughters translated.  They decline another interpreter.  Her daughter states that she started having some mouth sores on June 27.  She was started on antifungals and antivirals.  Her mouth sores got better but she developed some nausea and vomiting.  She vomited for several days and had some dizziness.  She was treated with meclizine as well as antiemetics including Zofran.  The vomiting stopped but she continued to have weakness.  Over the last 6 days she has been lying in bed and not active.  She is only got out of bed to go the bathroom.  She has soreness all over her body.  She is complaining of pain anywhere you touch her.  She has been complaining of a headache and neck pain as well.  She has not had any known fevers.  No confusion per the daughter.  No rashes.  She does not have a good appetite and only eats when prompted.  She has been sleeping most of the day over the last several days.     Past Medical History:  Diagnosis Date  . Arthritis   . Diabetes (HCC)   . High blood pressure     There are no active problems to display for this patient.   History reviewed. No pertinent surgical history.   OB History   No obstetric history on file.      Home Medications    Prior to Admission medications   Medication Sig Start Date End Date Taking? Authorizing Provider  acetaminophen (TYLENOL) 500 MG tablet Take 500 mg by mouth as needed for moderate pain or headache.    Yes [provider]  amLODipine (NORVASC) 5 MG tablet Take 5 mg by mouth every  evening.  12/31/18  Yes [provider]  folic acid (FOLVITE) 1 MG tablet Take 2 mg by mouth daily.  02/02/19  Yes [provider]  HUMIRA PEN 40 MG/0.4ML PNKT Inject 40 mg into the muscle every 14 (fourteen) days. 12/09/18  Yes [provider]  ibuprofen (ADVIL,MOTRIN) 200 MG tablet Take 200 mg by mouth every 6 (six) hours as needed for headache or moderate pain.    Yes [provider]  levothyroxine (SYNTHROID) 75 MCG tablet Take 75 mcg by mouth daily. 12/31/18  Yes [provider]  metFORMIN (GLUCOPHAGE) 1000 MG tablet Take one tablet by mouth twice daily with meals to control blood sugar Patient taking differently: Take 1,000 mg by mouth 2 (two) times daily with a meal.  01/09/15  Yes Sharon Seller, NP  metoprolol succinate (TOPROL-XL) 50 MG 24 hr tablet Take 50 mg by mouth daily. 01/24/19  Yes [provider]  Multiple Vitamin (MULTIVITAMIN WITH MINERALS) TABS tablet Take 1 tablet by mouth daily.   Yes [provider]  NOVOLOG MIX 70/30 (70-30) 100 UNIT/ML injection Inject 18 Units into the skin 2 (two) times a day. 01/24/19  Yes [provider]  telmisartan (MICARDIS) 20 MG tablet Take 20 mg by mouth daily.   Yes [provider]  glucose blood (FREESTYLE LITE) test strip Use to test blood sugar twice daily Dx: E11.9 12/08/14   Sharon Seller, NP  Lancets (FREESTYLE) lancets Use to test blood sugar twice daily. Dx: E11.9 12/08/14   Sharon Seller, NP  methotrexate 2.5 MG tablet Take 10 tablets by mouth every 7 (seven) days. 12/08/18   [provider]  Tdap (ADACEL) 11-26-13.5 LF-MCG/0.5 injection Inject 0.5 mLs into the muscle once. Patient not taking: Reported on 02/12/2019 01/09/15   Sharon Seller, NP    Family History Family History  Problem Relation Age of Onset  . Hypertension Mother     Social History Social History   Tobacco Use  . Smoking status: Never Smoker  . Smokeless tobacco:  Never Used  Substance Use Topics  . Alcohol use: No    Alcohol/week: 0.0 standard drinks  . Drug use: No     Allergies   Patient has no known allergies.   Review of Systems Review of Systems  Constitutional: Positive for fatigue. Negative for chills, diaphoresis and fever.  HENT: Negative for congestion, rhinorrhea and sneezing.   Eyes: Negative.   Respiratory: Negative for cough, chest tightness and shortness of breath.   Cardiovascular: Negative for chest pain and leg swelling.  Gastrointestinal: Positive for nausea and vomiting. Negative for abdominal pain, blood in stool and diarrhea.  Genitourinary: Negative for difficulty urinating, flank pain, frequency and hematuria.  Musculoskeletal: Positive for myalgias and neck pain. Negative for arthralgias and back pain.  Skin: Negative for rash.  Neurological: Positive for dizziness, weakness (generalized) and headaches. Negative for speech difficulty and numbness.     Physical Exam Updated Vital Signs BP (!) 122/95 (BP Location: Right Arm)   Pulse 75   Temp 99.5 F (37.5 C) (Rectal)   Resp 16   SpO2 97%   Physical Exam Constitutional:      Appearance: She is well-developed.  HENT:     Head: Normocephalic and atraumatic.  Eyes:     Pupils: Pupils are equal, round, and reactive to light.  Neck:     Comments: Positive pain on range of motion of her neck. Cardiovascular:     Rate and Rhythm: Normal rate and regular rhythm.     Heart sounds: Normal heart sounds.  Pulmonary:     Effort: Pulmonary effort is normal. No respiratory distress.     Breath sounds: Normal breath sounds. No wheezing or rales.  Chest:     Chest wall: No tenderness.  Abdominal:     General: Bowel sounds are normal.     Palpations: Abdomen is soft.     Tenderness: There is abdominal tenderness. There is no guarding or rebound.     Comments: Patient has some mild generalized tenderness to the abdomen although she is tender anywhere I touch her so  it is hard to distinguish if it is different or more tender in her abdomen.  Musculoskeletal: Normal range of motion.     Comments: She has tenderness throughout her body.  There is no joint swelling.  No rash.  Lymphadenopathy:     Cervical: No cervical adenopathy.  Skin:    General: Skin is warm and dry.     Findings: No rash.  Neurological:     General: No focal deficit present.     Mental Status: She is alert and oriented to person, place, and time.      ED Treatments / Results  Labs (all labs ordered are listed, but only abnormal  results are displayed) Labs Reviewed  COMPREHENSIVE METABOLIC PANEL - Abnormal; Notable for the following components:      Result Value   Sodium 132 (*)    Chloride 96 (*)    Glucose, Bld 158 (*)    All other components within normal limits  SARS CORONAVIRUS 2 (HOSPITAL ORDER, PERFORMED IN [facility])  CULTURE, BLOOD (ROUTINE X 2)  CULTURE, BLOOD (ROUTINE X 2)  CBC WITH DIFFERENTIAL/PLATELET  LACTIC ACID, PLASMA  URINALYSIS, ROUTINE W REFLEX MICROSCOPIC    EKG None   ED ECG REPORT   Date: 02/12/2019  Rate: 88  Rhythm: normal sinus rhythm  QRS Axis: normal  Intervals: normal  ST/T Wave abnormalities: normal  Conduction Disutrbances:none  Narrative Interpretation:   Old EKG Reviewed: none available  I have personally reviewed the EKG tracing and agree with the computerized printout as noted.   Radiology Dg Chest 2 View  Result Date: 02/12/2019 CLINICAL DATA:  Neck pain extending down the back. Dizziness. Combative and uncooperative during the examination. EXAM: CHEST - 2 VIEW COMPARISON:  07/07/2011. FINDINGS: Very poor inspiration. Grossly stable borderline enlarged cardiac silhouette. Grossly clear lungs with normal vascularity. Mild thoracic spine degenerative changes. IMPRESSION: No acute abnormality. Electronically Signed   By: Beckie SaltsSteven  Reid M.D.   On: 02/12/2019 20:09   Ct Head Wo Contrast  Result Date:  02/12/2019 CLINICAL DATA:  Headache, dizziness and neck pain. EXAM: CT HEAD WITHOUT CONTRAST TECHNIQUE: Contiguous axial images were obtained from the base of the skull through the vertex without intravenous contrast. COMPARISON:  11/22/2015. FINDINGS: Brain: Normal appearing cerebral hemispheres and posterior fossa structures. Normal size and position of the ventricles. No intracranial hemorrhage, mass lesion or CT evidence of acute infarction. Vascular: No hyperdense vessel or unexpected calcification. Skull: Normal. Negative for fracture or focal lesion. Sinuses/Orbits: Unremarkable. Other: None. IMPRESSION: Normal examination. Electronically Signed   By: Beckie SaltsSteven  Reid M.D.   On: 02/12/2019 20:06    Procedures Procedures (including critical care time)  Medications Ordered in ED Medications  sodium chloride 0.9 % bolus 1,000 mL (0 mLs Intravenous Stopped 02/12/19 2013)  fentaNYL (SUBLIMAZE) injection 50 mcg (50 mcg Intravenous Given 02/12/19 1909)  midazolam (VERSED) injection 1 mg (1 mg Intravenous Given 02/12/19 2012)     Initial Impression / Assessment and Plan / ED Course  I have reviewed the triage vital signs and the nursing notes.  Pertinent labs & imaging results that were available during my care of the patient were reviewed by me and considered in my medical decision making (see chart for details).        Patient is a 58 year old female who has a history of diabetes and rheumatoid arthritis, on immunosuppressants who presents with a 2-week history of worsening symptoms including mouth ulcers, vomiting and for the last 6 days, she has been sleeping all day and not getting out of bed.  She does not have confusion.  She is neurologically intact.  She has diffuse tenderness all over her body.  Initially I was a little concerned about possibly an atypical meningitis given her headache and neck pain although now she seems to be more diffusely tender.  On reexam she is moving her neck without  difficulty.  She does not seem to have any meningismus.  She is afebrile.  She has normal white count.  At this point I do not feel that she needs a spinal tap.  Her labs are non-concerning.  I did a head CT which shows  no acute abnormality.  Her chest x-ray does not show evidence of pneumonia.  Her COVID test is negative.  I am not sure the etiology of her symptoms but she is very sleepy in the room although she will wake up and answer questions and does not appear to be confused.  She is not eating and drinking.  She is not getting out of bed.  I spoke with Dr. Terrence Dupont who is on-call for her PCP, Dr. Doylene Canard who will come evaluate the patient for possible admission.  Final Clinical Impressions(s) / ED Diagnoses   Final diagnoses:  Weakness  Myalgia    ED Discharge Orders    None       Malvin Johns, MD 02/12/19 2112

## 2019-02-12 NOTE — H&P (Signed)
Victoria Cox is an 58 y.o. female.   Chief Complaint: Generalized body ache/poor appetite/recent mouth sores HPI: Patient is 58 year old Asian female with past medical history significant for hypertension, type 2 diabetes mellitus, rheumatoid arthritis being followed at [facility] came to ER by EMS complaining of generalized weakness and poor appetite as per her daughter she developed mouth sores approximately 3 weeks ago was treated with antifungal and antiviral with improvement in her symptoms subsequently developed nausea vomiting which was empirically treated with Zofran and PPI.  Patient denies any fever or chills but complains of vague generalized abdominal pain and poor appetite.    Denies cough or shortness of breath.  Presently denies any headaches or neck pain.  Complains of occasional difficulty voiding but denies dysuria frequency of urination.  Patient speaks mostly Gujarati language history mostly obtained from her daughter.  States she has been on chronic steroid therapy which has been slowly weaned off.  Now takes Humira and methotrexate for her rheumatoid arthritis.  Past Medical History:  Diagnosis Date  . Arthritis   . Diabetes ([place])   . High blood pressure     History reviewed. No pertinent surgical history.  Family History  Problem Relation Age of Onset  . Hypertension Mother    Social History:  reports that she has never smoked. She has never used smokeless tobacco. She reports that she does not drink alcohol or use drugs.  Allergies: No Known Allergies  (Not in a hospital admission)   Results for orders placed or performed during the hospital encounter of 02/12/19 (from the past 48 hour(s))  SARS Coronavirus 2 (CEPHEID - Performed in [place] hospital lab), Hosp Order     Status: None   Collection Time: 02/12/19  6:28 PM   Specimen: Nasopharyngeal Swab  Result Value Ref Range   SARS Coronavirus 2 NEGATIVE NEGATIVE    Comment: (NOTE) If result is  NEGATIVE SARS-CoV-2 target nucleic acids are NOT DETECTED. The SARS-CoV-2 RNA is generally detectable in upper and lower  respiratory specimens during the acute phase of infection. The lowest  concentration of SARS-CoV-2 viral copies this assay can detect is 250  copies / mL. A negative result does not preclude SARS-CoV-2 infection  and should not be used as the sole basis for treatment or other  patient management decisions.  A negative result may occur with  improper specimen collection / handling, submission of specimen other  than nasopharyngeal swab, presence of viral mutation(s) within the  areas targeted by this assay, and inadequate number of viral copies  (<250 copies / mL). A negative result must be combined with clinical  observations, patient history, and epidemiological information. If result is POSITIVE SARS-CoV-2 target nucleic acids are DETECTED. The SARS-CoV-2 RNA is generally detectable in upper and lower  respiratory specimens dur ing the acute phase of infection.  Positive  results are indicative of active infection with SARS-CoV-2.  Clinical  correlation with patient history and other diagnostic information is  necessary to determine patient infection status.  Positive results do  not rule out bacterial infection or co-infection with other viruses. If result is PRESUMPTIVE POSTIVE SARS-CoV-2 nucleic acids MAY BE PRESENT.   A presumptive positive result was obtained on the submitted specimen  and confirmed on repeat testing.  While 2019 novel coronavirus  (SARS-CoV-2) nucleic acids may be present in the submitted sample  additional confirmatory testing may be necessary for epidemiological  and / or clinical management purposes  to differentiate between  SARS-CoV-2  and other Sarbecovirus currently known to infect humans.  If clinically indicated additional testing with an alternate test  methodology [phone]) is advised. The SARS-CoV-2 RNA is generally  detectable  in upper and lower respiratory sp ecimens during the acute  phase of infection. The expected result is Negative. Fact Sheet for Patients:  BoilerBrush.com.cy Fact Sheet for Healthcare Providers: https://pope.com/ This test is not yet approved or cleared by the [place] FDA and has been authorized for detection and/or diagnosis of SARS-CoV-2 by FDA under an Emergency Use Authorization (EUA).  This EUA will remain in effect (meaning this test can be used) for the duration of the COVID-19 declaration under Section 564(b)(1) of the Act, 21 U.S.C. section 360bbb-3(b)(1), unless the authorization is terminated or revoked sooner. Performed at [facility], [address], [place], [place] [address]   Comprehensive metabolic panel     Status: Abnormal   Collection Time: 02/12/19  6:57 PM  Result Value Ref Range   Sodium 132 (L) 135 - 145 mmol/L   Potassium 3.9 3.5 - 5.1 mmol/L   Chloride 96 (L) 98 - 111 mmol/L   CO2 22 22 - 32 mmol/L   Glucose, Bld 158 (H) 70 - 99 mg/dL   BUN 18 6 - 20 mg/dL   Creatinine, Ser 5.28 0.44 - 1.00 mg/dL   Calcium 9.3 8.9 - 41.3 mg/dL   Total Protein 8.1 6.5 - 8.1 g/dL   Albumin 3.5 3.5 - 5.0 g/dL   AST 19 15 - 41 U/L   ALT 20 0 - 44 U/L   Alkaline Phosphatase 87 38 - 126 U/L   Total Bilirubin 0.4 0.3 - 1.2 mg/dL   GFR calc non Af Amer >60 >60 mL/min   GFR calc Af Amer >60 >60 mL/min   Anion gap 14 5 - 15    Comment: Performed at [facility], [address] [place], [place] [address]  CBC with Differential     Status: None   Collection Time: 02/12/19  6:57 PM  Result Value Ref Range   WBC 8.9 4.0 - 10.5 K/uL   RBC 4.51 3.87 - 5.11 MIL/uL   Hemoglobin 12.4 12.0 - 15.0 g/dL   HCT 02.7 25.3 - 66.4 %   MCV 86.5 80.0 - 100.0 fL   MCH 27.5 26.0 - 34.0 pg   MCHC 31.8 30.0 - 36.0 g/dL   RDW 40.3 47.4 - 25.9 %   Platelets 396 150 - 400 K/uL   nRBC 0.0 0.0 - 0.2 %    Neutrophils Relative % 74 %   Neutro Abs 6.5 1.7 - 7.7 K/uL   Lymphocytes Relative 16 %   Lymphs Abs 1.4 0.7 - 4.0 K/uL   Monocytes Relative 9 %   Monocytes Absolute 0.8 0.1 - 1.0 K/uL   Eosinophils Relative 0 %   Eosinophils Absolute 0.0 0.0 - 0.5 K/uL   Basophils Relative 0 %   Basophils Absolute 0.0 0.0 - 0.1 K/uL   Immature Granulocytes 1 %   Abs Immature Granulocytes 0.05 0.00 - 0.07 K/uL    Comment: Performed at [facility], [address] [place], [place] [address]  Lactic acid, plasma     Status: None   Collection Time: 02/12/19  6:57 PM  Result Value Ref Range   Lactic Acid, Venous 1.3 0.5 - 1.9 mmol/L    Comment: Performed at [facility], [address] [place], [place] [address]   Dg Chest 2  View  Result Date: 02/12/2019 CLINICAL DATA:  Neck pain extending down the back. Dizziness. Combative and uncooperative during the examination. EXAM: CHEST - 2 VIEW COMPARISON:  07/07/2011. FINDINGS: Very poor inspiration. Grossly stable borderline enlarged cardiac silhouette. Grossly clear lungs with normal vascularity. Mild thoracic spine degenerative changes. IMPRESSION: No acute abnormality. Electronically Signed   By: Beckie Salts M.D.   On: 02/12/2019 20:09   Ct Head Wo Contrast  Result Date: 02/12/2019 CLINICAL DATA:  Headache, dizziness and neck pain. EXAM: CT HEAD WITHOUT CONTRAST TECHNIQUE: Contiguous axial images were obtained from the base of the skull through the vertex without intravenous contrast. COMPARISON:  11/22/2015. FINDINGS: Brain: Normal appearing cerebral hemispheres and posterior fossa structures. Normal size and position of the ventricles. No intracranial hemorrhage, mass lesion or CT evidence of acute infarction. Vascular: No hyperdense vessel or unexpected calcification. Skull: Normal. Negative for fracture or focal lesion. Sinuses/Orbits: Unremarkable. Other: None. IMPRESSION: Normal examination. Electronically Signed   By:  Beckie Salts M.D.   On: 02/12/2019 20:06    Review of Systems  Constitutional: Positive for malaise/fatigue. Negative for chills, diaphoresis and fever.  HENT: Negative for hearing loss.   Eyes: Negative for blurred vision.  Respiratory: Negative for cough, hemoptysis, sputum production and shortness of breath.   Cardiovascular: Negative for chest pain, palpitations, orthopnea and leg swelling.  Gastrointestinal: Positive for nausea. Negative for abdominal pain and diarrhea.  Genitourinary: Negative for dysuria.  Skin: Negative for rash.  Neurological: Positive for dizziness.    Blood pressure (!) 122/95, pulse 75, temperature 99.5 F (37.5 C), temperature source Rectal, resp. rate 16, SpO2 97 %. Physical Exam  Constitutional: She is oriented to person, place, and time. She appears well-developed and well-nourished.  HENT:  Head: Normocephalic and atraumatic.  Eyes: Pupils are equal, round, and reactive to light. Conjunctivae are normal. Left eye exhibits no discharge. No scleral icterus.  Neck: Normal range of motion. Neck supple. No JVD present. No tracheal deviation present. No thyromegaly present.  Cardiovascular: Normal rate and regular rhythm.  Murmur (Soft systolic murmur noted) heard. Respiratory: Effort normal and breath sounds normal. No respiratory distress. She has no wheezes. She has no rales.  GI: Soft. Bowel sounds are normal. She exhibits no distension. There is no abdominal tenderness. There is no rebound.  Musculoskeletal:        General: No tenderness, deformity or edema.  Neurological: She is alert and oriented to person, place, and time.     Assessment/Plan Generalized weakness rule out sepsis syndrome Myalgias Hypertension Diabetes mellitus Morbid obesity Rheumatoid arthritis Plan As per orders   Rinaldo Cloud, MD 02/12/2019, 9:52 PM

## 2019-02-12 NOTE — ED Notes (Signed)
ED TO INPATIENT HANDOFF REPORT  ED Nurse Name and Phone #: Sharene Skeans [phone]  S Name/Age/Gender Victoria Cox 58 y.o. female Room/Bed: 1509/1509-01  Code Status   Code Status: Full Code  Home/SNF/Other Home Patient oriented to: self Is this baseline? No   Triage Complete: Triage complete  Chief Complaint Myalgia [M79.10] Weakness [R53.1]  Triage Note EMS states pt has had ulcers in her mouth, treated with meds, off Prednisone for 10 days, tomorrow will be a week she only sleeps except to go to bathroom. She c/o neck pain going down back, headache, dizziness treated with Meds. Poor appetite. 150/p-80-96% CBG 140 PT SPEAKS NO ENGLISH DAUGHTER IN LAW TRANSLATING.   Allergies No Known Allergies  Level of Care/Admitting Diagnosis ED Disposition    ED Disposition Condition Comment   Admit  Hospital Area: [facility] [[id number]]  Level of Care: Med-Surg [16]  Covid Evaluation: Asymptomatic Screening Protocol (No Symptoms)  Diagnosis: Generalized weakness [277824]  Admitting Physician: Rinaldo Cloud [[id number]]  Attending Physician: Rinaldo Cloud [[id number]]  Estimated length of stay: 3 - 4 days  Certification:: I certify this patient will need inpatient services for at least 2 midnights  PT Class (Do Not Modify): Inpatient [101]  PT Acc Code (Do Not Modify): Private [1]       B Medical/Surgery History Past Medical History:  Diagnosis Date  . Arthritis   . Diabetes ([facility])   . High blood pressure    History reviewed. No pertinent surgical history.   A IV Location/Drains/Wounds Patient Lines/Drains/Airways Status   Active Line/Drains/Airways    Name:   Placement date:   Placement time:   Site:   Days:   Peripheral IV 02/12/19 Right Hand   02/12/19    1854    Hand   less than 1          Intake/Output Last 24 hours  Intake/Output Summary (Last 24 hours) at 02/12/2019 2253 Last data filed at 02/12/2019 2101 Gross per 24 hour  Intake 1000 ml  Output  300 ml  Net 700 ml    Labs/Imaging Results for orders placed or performed during the hospital encounter of 02/12/19 (from the past 48 hour(s))  SARS Coronavirus 2 (CEPHEID - Performed in [facility] hospital lab), Hosp Order     Status: None   Collection Time: 02/12/19  6:28 PM   Specimen: Nasopharyngeal Swab  Result Value Ref Range   SARS Coronavirus 2 NEGATIVE NEGATIVE    Comment: (NOTE) If result is NEGATIVE SARS-CoV-2 target nucleic acids are NOT DETECTED. The SARS-CoV-2 RNA is generally detectable in upper and lower  respiratory specimens during the acute phase of infection. The lowest  concentration of SARS-CoV-2 viral copies this assay can detect is 250  copies / mL. A negative result does not preclude SARS-CoV-2 infection  and should not be used as the sole basis for treatment or other  patient management decisions.  A negative result may occur with  improper specimen collection / handling, submission of specimen other  than nasopharyngeal swab, presence of viral mutation(s) within the  areas targeted by this assay, and inadequate number of viral copies  (<250 copies / mL). A negative result must be combined with clinical  observations, patient history, and epidemiological information. If result is POSITIVE SARS-CoV-2 target nucleic acids are DETECTED. The SARS-CoV-2 RNA is generally detectable in upper and lower  respiratory specimens dur ing the acute phase of infection.  Positive  results are indicative of active infection with SARS-CoV-2.  Clinical  correlation with patient history and other diagnostic information is  necessary to determine patient infection status.  Positive results do  not rule out bacterial infection or co-infection with other viruses. If result is PRESUMPTIVE POSTIVE SARS-CoV-2 nucleic acids MAY BE PRESENT.   A presumptive positive result was obtained on the submitted specimen  and confirmed on repeat testing.  While 2019 novel coronavirus   (SARS-CoV-2) nucleic acids may be present in the submitted sample  additional confirmatory testing may be necessary for epidemiological  and / or clinical management purposes  to differentiate between  SARS-CoV-2 and other Sarbecovirus currently known to infect humans.  If clinically indicated additional testing with an alternate test  methodology [phone]) is advised. The SARS-CoV-2 RNA is generally  detectable in upper and lower respiratory sp ecimens during the acute  phase of infection. The expected result is Negative. Fact Sheet for Patients:  StrictlyIdeas.no Fact Sheet for Healthcare Providers: BankingDealers.co.za This test is not yet approved or cleared by the [place] FDA and has been authorized for detection and/or diagnosis of SARS-CoV-2 by FDA under an Emergency Use Authorization (EUA).  This EUA will remain in effect (meaning this test can be used) for the duration of the COVID-19 declaration under Section 564(b)(1) of the Act, 21 U.S.C. section 360bbb-3(b)(1), unless the authorization is terminated or revoked sooner. Performed at [facility], [place] [address] [place], [place] [address]   Comprehensive metabolic panel     Status: Abnormal   Collection Time: 02/12/19  6:57 PM  Result Value Ref Range   Sodium 132 (L) 135 - 145 mmol/L   Potassium 3.9 3.5 - 5.1 mmol/L   Chloride 96 (L) 98 - 111 mmol/L   CO2 22 22 - 32 mmol/L   Glucose, Bld 158 (H) 70 - 99 mg/dL   BUN 18 6 - 20 mg/dL   Creatinine, Ser 0.67 0.44 - 1.00 mg/dL   Calcium 9.3 8.9 - 10.3 mg/dL   Total Protein 8.1 6.5 - 8.1 g/dL   Albumin 3.5 3.5 - 5.0 g/dL   AST 19 15 - 41 U/L   ALT 20 0 - 44 U/L   Alkaline Phosphatase 87 38 - 126 U/L   Total Bilirubin 0.4 0.3 - 1.2 mg/dL   GFR calc non Af Amer >60 >60 mL/min   GFR calc Af Amer >60 >60 mL/min   Anion gap 14 5 - 15    Comment: Performed at [facility], [place]  [address], [place], [place] [address]  CBC with Differential     Status: None   Collection Time: 02/12/19  6:57 PM  Result Value Ref Range   WBC 8.9 4.0 - 10.5 K/uL   RBC 4.51 3.87 - 5.11 MIL/uL   Hemoglobin 12.4 12.0 - 15.0 g/dL   HCT 39.0 36.0 - 46.0 %   MCV 86.5 80.0 - 100.0 fL   MCH 27.5 26.0 - 34.0 pg   MCHC 31.8 30.0 - 36.0 g/dL   RDW 15.0 11.5 - 15.5 %   Platelets 396 150 - 400 K/uL   nRBC 0.0 0.0 - 0.2 %   Neutrophils Relative % 74 %   Neutro Abs 6.5 1.7 - 7.7 K/uL   Lymphocytes Relative 16 %   Lymphs Abs 1.4 0.7 - 4.0 K/uL   Monocytes Relative 9 %   Monocytes Absolute 0.8 0.1 - 1.0 K/uL   Eosinophils Relative 0 %   Eosinophils Absolute 0.0 0.0 - 0.5 K/uL   Basophils  Relative 0 %   Basophils Absolute 0.0 0.0 - 0.1 K/uL   Immature Granulocytes 1 %   Abs Immature Granulocytes 0.05 0.00 - 0.07 K/uL    Comment: Performed at [facility], [address] [place], [place] [address]  Lactic acid, plasma     Status: None   Collection Time: 02/12/19  6:57 PM  Result Value Ref Range   Lactic Acid, Venous 1.3 0.5 - 1.9 mmol/L    Comment: Performed at [facility], [address] [place], [place] [address]   Dg Chest 2 View  Result Date: 02/12/2019 CLINICAL DATA:  Neck pain extending down the back. Dizziness. Combative and uncooperative during the examination. EXAM: CHEST - 2 VIEW COMPARISON:  07/07/2011. FINDINGS: Very poor inspiration. Grossly stable borderline enlarged cardiac silhouette. Grossly clear lungs with normal vascularity. Mild thoracic spine degenerative changes. IMPRESSION: No acute abnormality. Electronically Signed   By: Beckie SaltsSteven  Reid M.D.   On: 02/12/2019 20:09   Ct Head Wo Contrast  Result Date: 02/12/2019 CLINICAL DATA:  Headache, dizziness and neck pain. EXAM: CT HEAD WITHOUT CONTRAST TECHNIQUE: Contiguous axial images were obtained from the base of the skull through the vertex without intravenous contrast. COMPARISON:   11/22/2015. FINDINGS: Brain: Normal appearing cerebral hemispheres and posterior fossa structures. Normal size and position of the ventricles. No intracranial hemorrhage, mass lesion or CT evidence of acute infarction. Vascular: No hyperdense vessel or unexpected calcification. Skull: Normal. Negative for fracture or focal lesion. Sinuses/Orbits: Unremarkable. Other: None. IMPRESSION: Normal examination. Electronically Signed   By: Beckie SaltsSteven  Reid M.D.   On: 02/12/2019 20:06    Pending Labs Unresulted Labs (From admission, onward)    Start     Ordered   02/19/19 0500  Creatinine, serum  (enoxaparin (LOVENOX)    CrCl >/= 30 ml/min)  Weekly,   R    Comments: while on enoxaparin therapy    02/12/19 2244   02/13/19 0500  Basic metabolic panel  Tomorrow morning,   R     02/12/19 2244   02/13/19 0500  CBC  Tomorrow morning,   R     02/12/19 2244   02/12/19 2245  Culture, sputum-assessment  Once,   R     02/12/19 2244   02/12/19 2226  Urinalysis, Routine w reflex microscopic  ONCE - STAT,   STAT     02/12/19 2225   02/12/19 2222  HIV antibody (Routine Testing)  Once,   STAT     02/12/19 2221   02/12/19 2222  TSH  Once,   STAT     02/12/19 2221   02/12/19 2222  Urine culture  Once,   STAT     02/12/19 2221   02/12/19 2222  Hemoglobin A1c  Once,   STAT     02/12/19 2221   02/12/19 1821  Culture, blood (routine x 2)  BLOOD CULTURE X 2,   STAT     02/12/19 1820          Vitals/Pain Today's Vitals   02/12/19 2030 02/12/19 2100 02/12/19 2130 02/12/19 2200  BP: 136/81 (!) 132/49 (!) 128/107 (!) 150/88  Pulse: 92 74 88 91  Resp: 20 17 (!) 23 (!) 24  Temp:      TempSrc:      SpO2: 96% 97% 100% 100%  PainSc:        Isolation Precautions No active isolations  Medications Medications  metoprolol succinate (TOPROL-XL) 24 hr tablet 50 mg (has no administration in  time range)  irbesartan (AVAPRO) tablet 75 mg (has no administration in time range)  levothyroxine (SYNTHROID) tablet 75 mcg  (has no administration in time range)  folic acid (FOLVITE) tablet 2 mg (has no administration in time range)  multivitamin with minerals tablet 1 tablet (has no administration in time range)  enoxaparin (LOVENOX) injection 40 mg (has no administration in time range)  0.9 %  sodium chloride infusion (has no administration in time range)  aspirin EC tablet 81 mg (has no administration in time range)  insulin aspart (novoLOG) injection 0-9 Units (has no administration in time range)  cefTRIAXone (ROCEPHIN) 1 g in sodium chloride 0.9 % 100 mL IVPB (has no administration in time range)  pantoprazole (PROTONIX) EC tablet 40 mg (has no administration in time range)  sodium chloride 0.9 % bolus 1,000 mL (0 mLs Intravenous Stopped 02/12/19 2013)  fentaNYL (SUBLIMAZE) injection 50 mcg (50 mcg Intravenous Given 02/12/19 1909)  midazolam (VERSED) injection 1 mg (1 mg Intravenous Given 02/12/19 2012)    Mobility walks with person assist Low fall risk   Focused Assessments Cardiac Assessment Handoff:    No results found for: CKTOTAL, CKMB, CKMBINDEX, TROPONINI No results found for: DDIMER Does the Patient currently have chest pain? No      R Recommendations: See Admitting Provider Note  Report given to: Mckenzie RN  Additional Notes:

## 2019-02-12 NOTE — ED Triage Notes (Addendum)
EMS states pt has had ulcers in her mouth, treated with meds, off Prednisone for 10 days, tomorrow will be a week she only sleeps except to go to bathroom. She c/o neck pain going down back, headache, dizziness treated with Meds. Poor appetite. 150/p-80-96% CBG 140 PT SPEAKS NO ENGLISH DAUGHTER IN LAW TRANSLATING.

## 2019-02-13 LAB — URINALYSIS, ROUTINE W REFLEX MICROSCOPIC
Bacteria, UA: NONE SEEN
Bilirubin Urine: NEGATIVE
Glucose, UA: NEGATIVE mg/dL
Hgb urine dipstick: NEGATIVE
Ketones, ur: 20 mg/dL — AB
Leukocytes,Ua: NEGATIVE
Nitrite: NEGATIVE
Protein, ur: NEGATIVE mg/dL
Specific Gravity, Urine: 1.013 (ref 1.005–1.030)
pH: 6 (ref 5.0–8.0)

## 2019-02-13 LAB — CBC
HCT: 38.7 % (ref 36.0–46.0)
Hemoglobin: 11.9 g/dL — ABNORMAL LOW (ref 12.0–15.0)
MCH: 27 pg (ref 26.0–34.0)
MCHC: 30.7 g/dL (ref 30.0–36.0)
MCV: 88 fL (ref 80.0–100.0)
Platelets: 360 10*3/uL (ref 150–400)
RBC: 4.4 MIL/uL (ref 3.87–5.11)
RDW: 14.7 % (ref 11.5–15.5)
WBC: 7 10*3/uL (ref 4.0–10.5)
nRBC: 0 % (ref 0.0–0.2)

## 2019-02-13 LAB — GLUCOSE, CAPILLARY
Glucose-Capillary: 106 mg/dL — ABNORMAL HIGH (ref 70–99)
Glucose-Capillary: 131 mg/dL — ABNORMAL HIGH (ref 70–99)
Glucose-Capillary: 143 mg/dL — ABNORMAL HIGH (ref 70–99)
Glucose-Capillary: 133 mg/dL — ABNORMAL HIGH (ref 70–99)

## 2019-02-13 LAB — BASIC METABOLIC PANEL
Anion gap: 14 (ref 5–15)
BUN: 11 mg/dL (ref 6–20)
CO2: 22 mmol/L (ref 22–32)
Calcium: 8.8 mg/dL — ABNORMAL LOW (ref 8.9–10.3)
Chloride: 97 mmol/L — ABNORMAL LOW (ref 98–111)
Creatinine, Ser: 0.49 mg/dL (ref 0.44–1.00)
GFR calc Af Amer: >60 mL/min (ref >60)
GFR calc non Af Amer: >60 mL/min (ref >60)
Glucose, Bld: 113 mg/dL — ABNORMAL HIGH (ref 70–99)
Potassium: 3.4 mmol/L — ABNORMAL LOW (ref 3.5–5.1)
Sodium: 133 mmol/L — ABNORMAL LOW (ref 135–145)

## 2019-02-13 LAB — TSH: TSH: 0.367 u[IU]/mL (ref 0.350–4.500)

## 2019-02-13 LAB — HIV ANTIBODY (ROUTINE TESTING W REFLEX): HIV Screen 4th Generation wRfx: NONREACTIVE

## 2019-02-13 MED ORDER — SUMATRIPTAN SUCCINATE 25 MG PO TABS
25.0000 mg | ORAL_TABLET | Freq: Once | ORAL | Status: AC
  Administered 2019-02-13: 25 mg via ORAL
  Filled 2019-02-13: qty 1

## 2019-02-13 MED ORDER — ONDANSETRON HCL 4 MG/2ML IJ SOLN
4.0000 mg | Freq: Four times a day (QID) | INTRAMUSCULAR | Status: DC | PRN
  Administered 2019-02-13 – 2019-02-15 (×4): 4 mg via INTRAVENOUS
  Filled 2019-02-13 (×4): qty 2

## 2019-02-13 MED ORDER — ACETAMINOPHEN 325 MG PO TABS
650.0000 mg | ORAL_TABLET | Freq: Four times a day (QID) | ORAL | Status: DC | PRN
  Administered 2019-02-13 – 2019-02-17 (×7): 650 mg via ORAL
  Filled 2019-02-13 (×9): qty 2

## 2019-02-13 NOTE — Progress Notes (Signed)
Subjective:  Patient denies any chest pain or shortness of breath.  Denies headache.  Still complaining of feeling weak and no appetite.  Cultures still pending  Objective:  Vital Signs in the last 24 hours: Temp:  [96.2 F (35.7 C)-99.5 F (37.5 C)] 97.5 F (36.4 C) (07/19 0627) Pulse Rate:  [74-92] 74 (07/19 0627) Resp:  [16-24] 18 (07/19 0627) BP: (122-157)/(49-107) 148/82 (07/19 0627) SpO2:  [96 %-100 %] 96 % (07/19 0627)  Intake/Output from previous day: 07/18 0701 - 07/19 0700 In: 1359.2 [I.V.:259.2; IV Piggyback:1100] Out: 300 [Urine:300] Intake/Output from this shift: Total I/O In: -  Out: 400 [Urine:400]  Physical Exam: Neck: no adenopathy, no carotid bruit, no JVD and supple, symmetrical, trachea midline Lungs: clear to auscultation bilaterally Heart: regular rate and rhythm, S1, S2 normal and Soft systolic murmur noted Abdomen: soft, non-tender; bowel sounds normal; no masses,  no organomegaly Extremities: extremities normal, atraumatic, no cyanosis or edema  Lab Results: Recent Labs    02/12/19 1857 02/13/19 0629  WBC 8.9 7.0  HGB 12.4 11.9*  PLT 396 360   Recent Labs    02/12/19 1857 02/13/19 0629  NA 132* 133*  K 3.9 3.4*  CL 96* 97*  CO2 22 22  GLUCOSE 158* 113*  BUN 18 11  CREATININE 0.67 0.49   No results for input(s): TROPONINI in the last 72 hours.  Invalid input(s): CK, MB Hepatic Function Panel Recent Labs    02/12/19 1857  PROT 8.1  ALBUMIN 3.5  AST 19  ALT 20  ALKPHOS 87  BILITOT 0.4   No results for input(s): CHOL in the last 72 hours. No results for input(s): PROTIME in the last 72 hours.  Imaging: Imaging results have been reviewed and Dg Chest 2 View  Result Date: 02/12/2019 CLINICAL DATA:  Neck pain extending down the back. Dizziness. Combative and uncooperative during the examination. EXAM: CHEST - 2 VIEW COMPARISON:  07/07/2011. FINDINGS: Very poor inspiration. Grossly stable borderline enlarged cardiac silhouette.  Grossly clear lungs with normal vascularity. Mild thoracic spine degenerative changes. IMPRESSION: No acute abnormality. Electronically Signed   By: Claudie Revering M.D.   On: 02/12/2019 20:09   Ct Head Wo Contrast  Result Date: 02/12/2019 CLINICAL DATA:  Headache, dizziness and neck pain. EXAM: CT HEAD WITHOUT CONTRAST TECHNIQUE: Contiguous axial images were obtained from the base of the skull through the vertex without intravenous contrast. COMPARISON:  11/22/2015. FINDINGS: Brain: Normal appearing cerebral hemispheres and posterior fossa structures. Normal size and position of the ventricles. No intracranial hemorrhage, mass lesion or CT evidence of acute infarction. Vascular: No hyperdense vessel or unexpected calcification. Skull: Normal. Negative for fracture or focal lesion. Sinuses/Orbits: Unremarkable. Other: None. IMPRESSION: Normal examination. Electronically Signed   By: Claudie Revering M.D.   On: 02/12/2019 20:06    Cardiac Studies:  Assessment/Plan:  Generalized weakness rule out sepsis syndrome Myalgias Hypertension Diabetes mellitus Morbid obesity Rheumatoid arthritis Plan Okay to restart methotrexate Continue present management Check blood and urine cultures Dr. Doylene Canard to follow in a.m.  LOS: 1 day    Charolette Forward 02/13/2019, 9:21 AM

## 2019-02-13 NOTE — Progress Notes (Signed)
Initial Nutrition Assessment  RD working remotely.  DOCUMENTATION CODES:   Not applicable  INTERVENTION:  Continue Ensure Enlive po BID, each supplement provides 350 kcal and 20 grams of protein.  Continue daily MVI.  Recommend measuring weight and height (last weight/height in chart are from 2016).   NUTRITION DIAGNOSIS:   Inadequate oral intake related to decreased appetite as evidenced by (per chart/MST report).  GOAL:   Patient will meet greater than or equal to 90% of their needs  MONITOR:   PO intake, Supplement acceptance, Labs, Weight trends, I & O's  REASON FOR ASSESSMENT:   Malnutrition Screening Tool    ASSESSMENT:   58 year old female with PMHx of DM, HTN, arthritis admitted with generalized weakness, myalgias.   Patient speaks [place]. Attempted to call patient over the phone with assistance from interpreter line (interpreter [phone]) but patient was unable to answer phone. Also attempted to call patient's daughter, but she did not answer phone. Patient is vegetarian and per MST report she eats a low-sugar, low-fat diet. No meal completion documented at this time so unable to tell how patient is eating in the hospital. Noted she reported to MD this morning her appetite remains poor.  There is no current weight or height from this admission. Last weight/height are from 2016. Patient was 88.5 kg on 01/09/2015.  Medications reviewed and include: Ensure Enlive BID, folic acid 2 mg daily, Novolog 0-9 units TID, levothyroxine, MVI daily, pantoprazole, NS at 75 mL/hr, ceftriaxone.  Labs reviewed: CBG 106-156, Sodium 133, Potassium 3.4, Chloride 97.  NUTRITION - FOCUSED PHYSICAL EXAM:  Unable to complete at this time.  Diet Order:   Diet Order            Diet vegetarian Room service appropriate? Yes; Fluid consistency: Thin  Diet effective now             EDUCATION NEEDS:   No education needs have been identified at this time  Skin:  Skin Assessment:  Reviewed RN Assessment  Last BM:  02/13/2019 - small type 6  Height:   Ht Readings from Last 1 Encounters:  01/09/15 5' (1.524 m)   Weight:   Wt Readings from Last 1 Encounters:  01/09/15 88.5 kg   Ideal Body Weight:  45.5 kg  BMI:  There is no height or weight on file to calculate BMI.  Estimated Nutritional Needs:   Kcal:  1700-1900  Protein:  85-95 grams  Fluid:  1.7-1.9 L/day  Willey Blade, MS, RD, LDN Office: [phone] Pager: [phone] After Hours/Weekend Pager: [phone]

## 2019-02-14 DIAGNOSIS — R5381 Other malaise: Secondary | ICD-10-CM

## 2019-02-14 DIAGNOSIS — Z79899 Other long term (current) drug therapy: Secondary | ICD-10-CM

## 2019-02-14 LAB — CBC
HCT: 37.2 % (ref 36.0–46.0)
Hemoglobin: 11.7 g/dL — ABNORMAL LOW (ref 12.0–15.0)
MCH: 28 pg (ref 26.0–34.0)
MCHC: 31.5 g/dL (ref 30.0–36.0)
MCV: 89 fL (ref 80.0–100.0)
Platelets: 398 10*3/uL (ref 150–400)
RBC: 4.18 MIL/uL (ref 3.87–5.11)
RDW: 14.8 % (ref 11.5–15.5)
WBC: 6.5 10*3/uL (ref 4.0–10.5)
nRBC: 0 % (ref 0.0–0.2)

## 2019-02-14 LAB — URINE CULTURE: Culture: NO GROWTH

## 2019-02-14 LAB — T4, FREE: Free T4: 1.33 ng/dL — ABNORMAL HIGH (ref 0.61–1.12)

## 2019-02-14 LAB — CORTISOL-AM, BLOOD: Cortisol - AM: 20.7 ug/dL (ref 6.7–22.6)

## 2019-02-14 LAB — GLUCOSE, CAPILLARY
Glucose-Capillary: 157 mg/dL — ABNORMAL HIGH (ref 70–99)
Glucose-Capillary: 131 mg/dL — ABNORMAL HIGH (ref 70–99)
Glucose-Capillary: 145 mg/dL — ABNORMAL HIGH (ref 70–99)
Glucose-Capillary: 200 mg/dL — ABNORMAL HIGH (ref 70–99)

## 2019-02-14 LAB — CK TOTAL AND CKMB (NOT AT ARMC)
CK, MB: 0.9 ng/mL (ref 0.5–5.0)
Relative Index: INVALID (ref 0.0–2.5)
Total CK: 18 U/L — ABNORMAL LOW (ref 38–234)

## 2019-02-14 MED ORDER — MUSCLE RUB 10-15 % EX CREA
TOPICAL_CREAM | CUTANEOUS | Status: DC | PRN
  Administered 2019-02-19: 1 via TOPICAL
  Administered 2019-02-21 (×2): via TOPICAL
  Filled 2019-02-14 (×2): qty 85

## 2019-02-14 MED ORDER — POTASSIUM CHLORIDE IN NACL 20-0.9 MEQ/L-% IV SOLN
INTRAVENOUS | Status: AC
  Administered 2019-02-14 – 2019-02-17 (×3): via INTRAVENOUS
  Filled 2019-02-14 (×6): qty 1000

## 2019-02-14 NOTE — Consult Note (Signed)
[facility]       Reason for Consult: Fatigue/weakness    Referring Physician: [name], MD  Active Problems:   Generalized weakness   . aspirin EC  81 mg Oral Daily  . enoxaparin (LOVENOX) injection  40 mg Subcutaneous QHS  . feeding supplement (ENSURE ENLIVE)  237 mL Oral BID BM  . folic acid  2 mg Oral Daily  . insulin aspart  0-9 Units Subcutaneous TID WC  . irbesartan  75 mg Oral Daily  . levothyroxine  75 mcg Oral Q0600  . metoprolol succinate  50 mg Oral Daily  . multivitamin with minerals  1 tablet Oral Daily  . pantoprazole  40 mg Oral Q0600    Recommendations: 1. Malaise/fatigue - unclear if this is infectious in nature as exposures appear lacking and labs WNL (no thrombocytopenia, leukopenia, or LFT elevation) do not support typical tick-borne illnesses, such as Ricketsiae or Lyme disease. The most likely considerations for her symptoms might be methotrexate toxicity/serum sickness vs. Recovery from adrenal insufficiency given recent cessation of prednisone after several months duration (unsure of how prednisone was tapered). The patient has no focal neurologic deficits at present and her family has already deferred an LP to further evaluate. Her cardiologist states he has seen no cardiac conduction abnormalities on her EKG or telemetry thus far. Serologies for Lyme disease have been sent but are notoriously unreliable to areas such as ours where the Borrelia tick is not endemic. Rocephin will treat CNS Lyme disease but I am uncertain this would be of any benefit if this is ruled out. Consider sending serologies for [facility] virus, but in the clear absence of fever, HA, neck stiffness, photophobia this diagnosis also appears unlikely.  2. Immunosuppression - Infectious risk certainly exists given her recent prednisone along with current methotrexate and humira, but labs and fever do not add any targeted evidence of this at present. If an LP  is performed, HSV and CMV-PCRs should be performed. She is NOT encephalopathic at this time however and she has no focal neurologic deficits, so there is no direct indication for this unless that were to change. Recommend holding her methotrexate and humira until further work up can be performed, given concern for infection.  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 Of note, much of the history was gathered speaking to the patient's daughter-in-law on the phone as the patient speaks a dialect of a language for which our facility has no translator services for her. In late 2019, the patient was diagnosed with seronegative RA for which she was started on prednisone, high-dose methotrexate, and humira. Her joint complaints had slowly been improving but beginning in mid-June, she began developing oral sores and decreasing appetite. She was treated with diflucan and an elixir (likely nystatin) and had complete resolution of these symptoms per her daughter-in-law. She began developing worsening fatigue following this episode ~2-3 weeks PTA here, so her rheumatologist had her hold her MTX and humira for a week and then later resume at 15 mg weekly instead of previous dosing of 25 mg weekly. Of note, she also had just completed a 6 month long taper of her prednisone ~2 weeks prior to her admission at [facility]. Per  the daughter-in-law, the patient has not travelled anywhere this year, has stayed quarantined at home since the pandemic began, and her only outdoor exposure has been sitting in the courtyard at her home (which is away from any wooded areas). She denies HA, photophobia, neck stiffness, and has had no fever as an OP nor  here thus far. She has had a lesion along her LT upper arm for the past 2-3 weeks that is non-tender, non-erythematous, and without erythema or drainage. Family denies any recent insect or tick bites. She has been on rocephin since admission. It is unclear what this is treating. Fever curve, ABX usage, labs, imaging, medications, and [facility] rheumatology notes all independently reviewed  Review of Systems:  Review of Systems  Constitutional: Positive for malaise/fatigue. Negative for chills, fever and weight loss.  HENT: Negative for congestion, hearing loss, sinus pain and sore throat.   Eyes: Negative for blurred vision, double vision, photophobia and discharge.  Respiratory: Negative for cough, hemoptysis and shortness of breath.   Cardiovascular: Negative for chest pain, palpitations, orthopnea and leg swelling.  Gastrointestinal: Positive for nausea. Negative for abdominal pain, constipation, diarrhea, heartburn and vomiting.  Genitourinary: Negative for dysuria, flank pain, frequency and urgency.  Musculoskeletal: Positive for joint pain. Negative for back pain and myalgias.  Skin: Negative for itching and rash.  Neurological: Positive for weakness. Negative for tremors, focal weakness, seizures, loss of consciousness and headaches.  Endo/Heme/Allergies: Negative for polydipsia. Does not bruise/bleed easily.  Psychiatric/Behavioral: Negative for depression, hallucinations and substance abuse. The patient is not nervous/anxious and does not have insomnia.     All other systems reviewed and are negative    Past Medical History:  Diagnosis Date  . Arthritis   . Diabetes ([place])   . High blood pressure     Social History   Tobacco Use  . Smoking status: Never Smoker  . Smokeless tobacco: Never Used  Substance Use Topics  . Alcohol use: No    Alcohol/week: 0.0 standard drinks  . Drug use: No    Family History  Problem Relation Age of Onset  . Hypertension Mother       Current Facility-Administered Medications:  .  0.9 % NaCl with KCl 20 mEq/ L  infusion, , Intravenous, Continuous, Dixie Dials, MD, Last Rate: 50 mL/hr at 02/14/19 1034 .  acetaminophen (TYLENOL) tablet 650 mg, 650 mg, Oral, Q6H PRN, Bodenheimer, Charles A, NP, 650 mg at 02/14/19 0535 .  aspirin EC tablet 81 mg, 81 mg, Oral, Daily, Harwani, Mohan, MD .  cefTRIAXone (ROCEPHIN) 1 g in sodium chloride 0.9 % 100 mL IVPB, 1 g, Intravenous, QHS, Harwani, Mohan, MD, Last Rate: 200 mL/hr at 02/13/19 2119, 1 g at 02/13/19 2119 .  enoxaparin (LOVENOX) injection 40 mg, 40 mg, Subcutaneous, QHS, Harwani, Mohan, MD, 40 mg at 02/13/19 2117 .  feeding supplement (ENSURE ENLIVE) (ENSURE ENLIVE) liquid 237 mL, 237 mL, Oral, BID BM, Charolette Forward, MD, 237 mL at 02/14/19 0933 .  folic acid (FOLVITE) tablet 2 mg, 2 mg, Oral, Daily, Charolette Forward, MD, 2 mg at 02/14/19 0933 .  insulin aspart (novoLOG) injection 0-9 Units, 0-9 Units, Subcutaneous, TID WC, Charolette Forward, MD, 1 Units at 02/14/19 1157 .  irbesartan (AVAPRO) tablet 75 mg, 75 mg, Oral, Daily, Charolette Forward, MD, 75 mg at 02/14/19 0933 .  levothyroxine (SYNTHROID) tablet 75 mcg, 75 mcg, Oral, Q0600, Charolette Forward, MD, 75 mcg at 02/14/19 0528 .  metoprolol succinate (TOPROL-XL) 24 hr tablet 50 mg,  50 mg, Oral, Daily, Rinaldo Cloud, MD, 50 mg at 02/14/19 0933 .  multivitamin with minerals tablet 1 tablet, 1 tablet, Oral, Daily, Rinaldo Cloud, MD, 1 tablet at 02/14/19 0933 .  ondansetron (ZOFRAN) injection 4 mg, 4 mg, Intravenous, Q6H PRN, Rinaldo Cloud, MD, 4 mg at 02/14/19 1203 .  pantoprazole (PROTONIX) EC tablet 40 mg, 40 mg, Oral, Q0600, Rinaldo Cloud, MD, 40 mg at 02/14/19 0528  No Known Allergies  Vitals:   02/14/19 0913 02/14/19 1342  BP: 140/79 137/68  Pulse: 86 92  Resp: 18 18  Temp: 97.7 F (36.5 C) 97.8 F (36.6 C)  SpO2: 96% 98%     Physical Exam Gen: pleasant, NAD, A&Ox 3, mostly answers yes/no questions due to language  deficits Head: NCAT, no temporal wasting evident EENT: PERRL, EOMI, MMM, adequate dentition Neck: supple, no meningsmus, no JVD CV: NRRR, no murmurs evident Pulm: CTA bilaterally, no wheeze or retractions Abd: soft, NTND, +BS Extrems:  no LE edema, 2+ pulses Skin: solitary necrotic lesion to LT upper arm w/o drainage or surrounding erythema, no rashes OTW, adequate skin turgor Neuro: CN II-XII grossly intact, no focal neurologic deficits appreciated, gait was not assessed, A&Ox 3   Lab Results  Component Value Date   WBC 6.5 02/14/2019   HGB 11.7 (L) 02/14/2019   HCT 37.2 02/14/2019   MCV 89.0 02/14/2019   PLT 398 02/14/2019    Lab Results  Component Value Date   CREATININE 0.49 02/13/2019   BUN 11 02/13/2019   NA 133 (L) 02/13/2019   K 3.4 (L) 02/13/2019   CL 97 (L) 02/13/2019   CO2 22 02/13/2019    Lab Results  Component Value Date   ALT 20 02/12/2019   AST 19 02/12/2019   ALKPHOS 87 02/12/2019     Microbiology: Recent Results (from the past 240 hour(s))  SARS Coronavirus 2 (CEPHEID - Performed in [facility] hospital lab), Hosp Order     Status: None   Collection Time: 02/12/19  6:28 PM   Specimen: Nasopharyngeal Swab  Result Value Ref Range Status   SARS Coronavirus 2 NEGATIVE NEGATIVE Final    Comment: (NOTE) If result is NEGATIVE SARS-CoV-2 target nucleic acids are NOT DETECTED. The SARS-CoV-2 RNA is generally detectable in upper and lower  respiratory specimens during the acute phase of infection. The lowest  concentration of SARS-CoV-2 viral copies this assay can detect is 250  copies / mL. A negative result does not preclude SARS-CoV-2 infection  and should not be used as the sole basis for treatment or other  patient management decisions.  A negative result may occur with  improper specimen collection / handling, submission of specimen other  than nasopharyngeal swab, presence of viral mutation(s) within the  areas targeted by this assay, and inadequate  number of viral copies  (<250 copies / mL). A negative result must be combined with clinical  observations, patient history, and epidemiological information. If result is POSITIVE SARS-CoV-2 target nucleic acids are DETECTED. The SARS-CoV-2 RNA is generally detectable in upper and lower  respiratory specimens dur ing the acute phase of infection.  Positive  results are indicative of active infection with SARS-CoV-2.  Clinical  correlation with patient history and other diagnostic information is  necessary to determine patient infection status.  Positive results do  not rule out bacterial infection or co-infection with other viruses. If result is PRESUMPTIVE POSTIVE SARS-CoV-2 nucleic acids MAY BE PRESENT.   A presumptive positive result was obtained on the  submitted specimen  and confirmed on repeat testing.  While 2019 novel coronavirus  (SARS-CoV-2) nucleic acids may be present in the submitted sample  additional confirmatory testing may be necessary for epidemiological  and / or clinical management purposes  to differentiate between  SARS-CoV-2 and other Sarbecovirus currently known to infect humans.  If clinically indicated additional testing with an alternate test  methodology [phone]([id number]) is advised. The SARS-CoV-2 RNA is generally  detectable in upper and lower respiratory sp ecimens during the acute  phase of infection. The expected result is Negative. Fact Sheet for Patients:  BoilerBrush.com.cyhttps://www.fda.gov/media/136312/download Fact Sheet for Healthcare Providers: https://pope.com/https://www.fda.gov/media/136313/download This test is not yet approved or cleared by the [place] FDA and has been authorized for detection and/or diagnosis of SARS-CoV-2 by FDA under an Emergency Use Authorization (EUA).  This EUA will remain in effect (meaning this test can be used) for the duration of the COVID-19 declaration under Section 564(b)(1) of the Act, 21 U.S.C. section 360bbb-3(b)(1), unless the  authorization is terminated or revoked sooner. Performed at [facility], [address] [place], [place] [address]   Culture, blood (routine x 2)     Status: None (Preliminary result)   Collection Time: 02/12/19  6:57 PM   Specimen: BLOOD LEFT HAND  Result Value Ref Range Status   Specimen Description   Final    BLOOD LEFT HAND Performed at [facility], [address] [place], [place] [address]    Special Requests   Final    BOTTLES DRAWN AEROBIC AND ANAEROBIC Blood Culture results may not be optimal due to an excessive volume of blood received in culture bottles Performed at [facility], [address] [place], [place] [address]    Culture   Final    NO GROWTH 2 DAYS Performed at [facility], [address] [place], [place] [address]    Report Status PENDING  Incomplete  Culture, blood (routine x 2)     Status: None (Preliminary result)   Collection Time: 02/12/19  6:57 PM   Specimen: BLOOD RIGHT HAND  Result Value Ref Range Status   Specimen Description   Final    BLOOD RIGHT HAND Performed at [facility], [address] [place], [place] [address]    Special Requests   Final    BOTTLES DRAWN AEROBIC AND ANAEROBIC Blood Culture adequate volume Performed at [facility], [address] [place], [place] [address]    Culture   Final    NO GROWTH 2 DAYS Performed at [facility], [address] [place], [place] [address]    Report Status PENDING  Incomplete  Urine culture     Status: None   Collection Time: 02/13/19  1:31 AM   Specimen: Urine, Random  Result Value Ref Range Status   Specimen Description   Final    URINE, RANDOM Performed at [facility] [place] [facility], [address] [place], [place] [address]    Special Requests   Final    NONE Performed at [facility], [address] [place], [place] [address]    Culture    Final    NO GROWTH Performed at [facility], [address] [place], [place] [address]    Report Status 02/14/2019 FINAL  Final    [place] LionsJames N , MD [facility] [email or web address] 02/14/2019, 2:43 PM

## 2019-02-14 NOTE — Progress Notes (Addendum)
Ref: [name], MD   Subjective:  Headache  And weakness continues. TSH is normal. WBC count is normal. Minimal electrolytes imbalance, Na 133 and K is 3.4. No orthostatic hypertension, BP 140/79 lying down, 144/88 sitting and 153/85 standing. Oxygen saturation remained 97.6 to 97.7 %, mild tachycardia with standing as HR increased to 105 from 80.  Objective:  Vital Signs in the last 24 hours: Temp:  [97.6 F (36.4 C)-98.7 F (37.1 C)] 97.7 F (36.5 C) (07/20 0913) Pulse Rate:  [76-106] 86 (07/20 0913) Resp:  [18-20] 18 (07/20 0913) BP: (136-159)/(61-103) 140/79 (07/20 0913) SpO2:  [96 %-99 %] 96 % (07/20 0913) Weight:  [84.6 kg] 84.6 kg (07/19 1514)  Physical Exam: BP Readings from Last 1 Encounters:  02/14/19 140/79     Wt Readings from Last 1 Encounters:  02/13/19 84.6 kg    Weight change:  Body mass index is 33.04 kg/m. HEENT: [place]/AT, Eyes-Brown, PERL, EOMI, Conjunctiva-Pink, Sclera-Non-icteric Neck: No JVD, No bruit, Trachea midline. Lungs:  Clear, Bilateral. Cardiac:  Regular rhythm, normal S1 and S2, no S3. II/VI systolic murmur. Abdomen:  Soft, non-tender. BS present. Extremities:  No edema present. No cyanosis. No clubbing. CNS: AxOx3, Cranial nerves grossly intact, moves all 4 extremities.  Skin: Warm and dry.   Intake/Output from previous day: 07/19 0701 - 07/20 0700 In: 1607.7 [I.V.:1507.7; IV Piggyback:100] Out: 400 [Urine:400]    Lab Results: BMET    Component Value Date/Time   NA 133 (L) 02/13/2019 0629   NA 132 (L) 02/12/2019 1857   NA 139 11/30/2014 1001   K 3.4 (L) 02/13/2019 0629   K 3.9 02/12/2019 1857   K 4.5 11/30/2014 1001   CL 97 (L) 02/13/2019 0629   CL 96 (L) 02/12/2019 1857   CL 99 11/30/2014 1001   CO2 22 02/13/2019 0629   CO2 22 02/12/2019 1857   CO2 23 11/30/2014 1001   GLUCOSE 113 (H) 02/13/2019 0629   GLUCOSE 158 (H) 02/12/2019 1857   GLUCOSE 147 (H) 11/30/2014 1001   BUN 11 02/13/2019 0629   BUN 18 02/12/2019 1857   BUN 11 11/30/2014 1001   CREATININE 0.49 02/13/2019 0629   CREATININE 0.67 02/12/2019 1857   CREATININE 0.59 11/30/2014 1001   CALCIUM 8.8 (L) 02/13/2019 0629   CALCIUM 9.3 02/12/2019 1857   CALCIUM 9.6 11/30/2014 1001   GFRNONAA >60 02/13/2019 0629   GFRNONAA >60 02/12/2019 1857   GFRNONAA 104 11/30/2014 1001   GFRAA >60 02/13/2019 0629   GFRAA >60 02/12/2019 1857   GFRAA 119 11/30/2014 1001   CBC    Component Value Date/Time   WBC 7.0 02/13/2019 0629   RBC 4.40 02/13/2019 0629   HGB 11.9 (L) 02/13/2019 0629   HGB 12.5 11/30/2014 1001   HCT 38.7 02/13/2019 0629   HCT 38.6 11/30/2014 1001   PLT 360 02/13/2019 0629   PLT 369 11/30/2014 1001   MCV 88.0 02/13/2019 0629   MCV 83 11/30/2014 1001   MCH 27.0 02/13/2019 0629   MCHC 30.7 02/13/2019 0629   RDW 14.7 02/13/2019 0629   RDW 14.1 11/30/2014 1001   LYMPHSABS 1.4 02/12/2019 1857   LYMPHSABS 2.2 11/30/2014 1001   MONOABS 0.8 02/12/2019 1857   EOSABS 0.0 02/12/2019 1857   EOSABS 0.2 11/30/2014 1001   BASOSABS 0.0 02/12/2019 1857   BASOSABS 0.0 11/30/2014 1001   HEPATIC Function Panel Recent Labs    02/12/19 1857  PROT 8.1   HEMOGLOBIN A1C No components found for: HGA1C,  MPG  CARDIAC ENZYMES No results found for: CKTOTAL, CKMB, CKMBINDEX, TROPONINI BNP No results for input(s): PROBNP in the last 8760 hours. TSH Recent Labs    02/13/19 0629  TSH 0.367   CHOLESTEROL No results for input(s): CHOL in the last 8760 hours.  Scheduled Meds: . aspirin EC  81 mg Oral Daily  . enoxaparin (LOVENOX) injection  40 mg Subcutaneous QHS  . feeding supplement (ENSURE ENLIVE)  237 mL Oral BID BM  . folic acid  2 mg Oral Daily  . insulin aspart  0-9 Units Subcutaneous TID WC  . irbesartan  75 mg Oral Daily  . levothyroxine  75 mcg Oral Q0600  . metoprolol succinate  50 mg Oral Daily  . multivitamin with minerals  1 tablet Oral Daily  . pantoprazole  40 mg Oral Q0600   Continuous Infusions: . sodium chloride 75 mL/hr  at 02/13/19 1213  . cefTRIAXone (ROCEPHIN)  IV 1 g (02/13/19 2119)   PRN Meds:.acetaminophen, ondansetron (ZOFRAN) IV  Assessment/Plan: Generalized weakness Myalgias Headache Hypertension Type 2 DM Morbid obesity Rheumatoid arthritis  R/O lyme disease IV fluids with potassium for early dehydration AM cortisol random Free T3 and T4.   LOS: 2 days   Time spent including chart review, lab review, examination, discussion with patient : 40 min   Dixie Dials  MD  02/14/2019, 9:20 AM

## 2019-02-15 ENCOUNTER — Inpatient Hospital Stay (HOSPITAL_COMMUNITY): Payer: Medicaid Other

## 2019-02-15 ENCOUNTER — Encounter (HOSPITAL_COMMUNITY)
Admission: EM | Disposition: A | Discharge status: Home Health | Payer: Self-pay | Source: Home / Self Care | Attending: Cardiovascular Disease

## 2019-02-15 ENCOUNTER — Inpatient Hospital Stay (HOSPITAL_COMMUNITY): Payer: Medicaid Other | Admitting: Anesthesiology

## 2019-02-15 DIAGNOSIS — I639 Cerebral infarction, unspecified: Secondary | ICD-10-CM

## 2019-02-15 HISTORY — PX: RADIOLOGY WITH ANESTHESIA: SHX6223

## 2019-02-15 LAB — BASIC METABOLIC PANEL
Anion gap: 11 (ref 5–15)
Anion gap: 11 (ref 5–15)
Anion gap: 15 (ref 5–15)
BUN: 7 mg/dL (ref 6–20)
BUN: 8 mg/dL (ref 6–20)
BUN: 7 mg/dL (ref 6–20)
CO2: 22 mmol/L (ref 22–32)
CO2: 23 mmol/L (ref 22–32)
CO2: 22 mmol/L (ref 22–32)
Calcium: 8.7 mg/dL — ABNORMAL LOW (ref 8.9–10.3)
Calcium: 8.8 mg/dL — ABNORMAL LOW (ref 8.9–10.3)
Calcium: 8.9 mg/dL (ref 8.9–10.3)
Chloride: 97 mmol/L — ABNORMAL LOW (ref 98–111)
Chloride: 97 mmol/L — ABNORMAL LOW (ref 98–111)
Chloride: 95 mmol/L — ABNORMAL LOW (ref 98–111)
Creatinine, Ser: 0.56 mg/dL (ref 0.44–1.00)
Creatinine, Ser: 0.61 mg/dL (ref 0.44–1.00)
Creatinine, Ser: 0.59 mg/dL (ref 0.44–1.00)
GFR calc Af Amer: >60 mL/min (ref >60)
GFR calc Af Amer: >60 mL/min (ref >60)
GFR calc Af Amer: >60 mL/min (ref >60)
GFR calc non Af Amer: >60 mL/min (ref >60)
GFR calc non Af Amer: >60 mL/min (ref >60)
GFR calc non Af Amer: >60 mL/min (ref >60)
Glucose, Bld: 128 mg/dL — ABNORMAL HIGH (ref 70–99)
Glucose, Bld: 193 mg/dL — ABNORMAL HIGH (ref 70–99)
Glucose, Bld: 175 mg/dL — ABNORMAL HIGH (ref 70–99)
Potassium: 3.7 mmol/L (ref 3.5–5.1)
Potassium: 3.6 mmol/L (ref 3.5–5.1)
Potassium: 3.5 mmol/L (ref 3.5–5.1)
Sodium: 130 mmol/L — ABNORMAL LOW (ref 135–145)
Sodium: 131 mmol/L — ABNORMAL LOW (ref 135–145)
Sodium: 132 mmol/L — ABNORMAL LOW (ref 135–145)

## 2019-02-15 LAB — CBC WITH DIFFERENTIAL/PLATELET
Abs Immature Granulocytes: 0.07 10*3/uL (ref 0.00–0.07)
Basophils Absolute: 0.1 10*3/uL (ref 0.0–0.1)
Basophils Relative: 0 %
Eosinophils Absolute: 0 10*3/uL (ref 0.0–0.5)
Eosinophils Relative: 0 %
HCT: 41.3 % (ref 36.0–46.0)
Hemoglobin: 12.7 g/dL (ref 12.0–15.0)
Immature Granulocytes: 1 %
Lymphocytes Relative: 11 %
Lymphs Abs: 1.4 10*3/uL (ref 0.7–4.0)
MCH: 26.5 pg (ref 26.0–34.0)
MCHC: 30.8 g/dL (ref 30.0–36.0)
MCV: 86 fL (ref 80.0–100.0)
Monocytes Absolute: 0.8 10*3/uL (ref 0.1–1.0)
Monocytes Relative: 6 %
Neutro Abs: 10.6 10*3/uL — ABNORMAL HIGH (ref 1.7–7.7)
Neutrophils Relative %: 82 %
Platelets: 470 10*3/uL — ABNORMAL HIGH (ref 150–400)
RBC: 4.8 MIL/uL (ref 3.87–5.11)
RDW: 14.7 % (ref 11.5–15.5)
WBC: 12.9 10*3/uL — ABNORMAL HIGH (ref 4.0–10.5)
nRBC: 0 % (ref 0.0–0.2)

## 2019-02-15 LAB — GLUCOSE, CAPILLARY
Glucose-Capillary: 133 mg/dL — ABNORMAL HIGH (ref 70–99)
Glucose-Capillary: 196 mg/dL — ABNORMAL HIGH (ref 70–99)
Glucose-Capillary: 129 mg/dL — ABNORMAL HIGH (ref 70–99)
Glucose-Capillary: 162 mg/dL — ABNORMAL HIGH (ref 70–99)

## 2019-02-15 LAB — LIPASE, BLOOD: Lipase: 29 U/L (ref 11–51)

## 2019-02-15 LAB — T3, FREE: T3, Free: 2.1 pg/mL (ref 2.0–4.4)

## 2019-02-15 LAB — AMYLASE: Amylase: 47 U/L (ref 28–100)

## 2019-02-15 SURGERY — IR WITH ANESTHESIA
Anesthesia: General

## 2019-02-15 MED ORDER — LORAZEPAM 2 MG/ML IJ SOLN
2.0000 mg | Freq: Once | INTRAMUSCULAR | Status: AC
  Administered 2019-02-15: 2 mg via INTRAVENOUS

## 2019-02-15 MED ORDER — CYANOCOBALAMIN 1000 MCG/ML IJ SOLN
1000.0000 ug | Freq: Once | INTRAMUSCULAR | Status: DC
  Filled 2019-02-15 (×2): qty 1

## 2019-02-15 MED ORDER — LORAZEPAM 2 MG/ML IJ SOLN
INTRAMUSCULAR | Status: AC
  Filled 2019-02-15: qty 1

## 2019-02-15 MED ORDER — FOLIC ACID 5 MG/ML IJ SOLN
1.0000 mg | Freq: Once | INTRAMUSCULAR | Status: DC
  Filled 2019-02-15: qty 0.2

## 2019-02-15 MED ORDER — LORAZEPAM 2 MG/ML IJ SOLN
1.0000 mg | Freq: Once | INTRAMUSCULAR | Status: AC
  Administered 2019-02-15: 1 mg via INTRAVENOUS

## 2019-02-15 MED ORDER — PROPOFOL 10 MG/ML IV BOLUS
INTRAVENOUS | Status: DC | PRN
  Administered 2019-02-15: 170 mg via INTRAVENOUS

## 2019-02-15 MED ORDER — EPTIFIBATIDE 20 MG/10ML IV SOLN
INTRAVENOUS | Status: AC
  Filled 2019-02-15: qty 10

## 2019-02-15 MED ORDER — FENTANYL CITRATE (PF) 100 MCG/2ML IJ SOLN
50.0000 ug | Freq: Once | INTRAMUSCULAR | Status: AC
  Administered 2019-02-15: 50 ug via INTRAVENOUS

## 2019-02-15 MED ORDER — CEFAZOLIN SODIUM-DEXTROSE 2-4 GM/100ML-% IV SOLN
INTRAVENOUS | Status: AC
  Filled 2019-02-15: qty 100

## 2019-02-15 MED ORDER — SENNOSIDES-DOCUSATE SODIUM 8.6-50 MG PO TABS
1.0000 | ORAL_TABLET | Freq: Once | ORAL | Status: AC
  Administered 2019-02-18: 1 via ORAL
  Filled 2019-02-15: qty 1

## 2019-02-15 MED ORDER — MIDAZOLAM HCL 2 MG/2ML IJ SOLN: 2.0000 mg | Freq: Once | INTRAMUSCULAR | Status: DC

## 2019-02-15 MED ORDER — SODIUM CHLORIDE 0.9 % IV SOLN
INTRAVENOUS | Status: DC | PRN
  Administered 2019-02-15: 23:00:00 via INTRAVENOUS

## 2019-02-15 MED ORDER — LORAZEPAM 2 MG/ML IJ SOLN
0.5000 mg | Freq: Once | INTRAMUSCULAR | Status: AC
  Administered 2019-02-15: 0.5 mg via INTRAVENOUS

## 2019-02-15 MED ORDER — IOHEXOL 300 MG/ML  SOLN
150.0000 mL | Freq: Once | INTRAMUSCULAR | Status: AC | PRN
  Administered 2019-02-16: 75 mL via INTRA_ARTERIAL

## 2019-02-15 MED ORDER — LIDOCAINE HCL (CARDIAC) PF 100 MG/5ML IV SOSY
PREFILLED_SYRINGE | INTRAVENOUS | Status: DC | PRN
  Administered 2019-02-15: 60 mg via INTRATRACHEAL

## 2019-02-15 MED ORDER — ROCURONIUM BROMIDE 100 MG/10ML IV SOLN
INTRAVENOUS | Status: DC | PRN
  Administered 2019-02-15: 100 mg via INTRAVENOUS
  Administered 2019-02-16: 50 mg via INTRAVENOUS

## 2019-02-15 MED ORDER — ONDANSETRON HCL 4 MG/2ML IJ SOLN
4.0000 mg | Freq: Three times a day (TID) | INTRAMUSCULAR | Status: DC
  Administered 2019-02-15 – 2019-02-20 (×14): 4 mg via INTRAVENOUS
  Filled 2019-02-15 (×13): qty 2

## 2019-02-15 MED ORDER — TIZANIDINE HCL 4 MG PO TABS
2.0000 mg | ORAL_TABLET | Freq: Once | ORAL | Status: AC
  Administered 2019-02-15: 10:00:00 2 mg via ORAL
  Filled 2019-02-15: qty 1

## 2019-02-15 MED ORDER — ASPIRIN 325 MG PO TABS
325.0000 mg | ORAL_TABLET | Freq: Every day | ORAL | Status: DC
  Administered 2019-02-17 – 2019-02-21 (×5): 325 mg via ORAL
  Filled 2019-02-15 (×5): qty 1

## 2019-02-15 MED ORDER — IOHEXOL 350 MG/ML SOLN
50.0000 mL | Freq: Once | INTRAVENOUS | Status: AC | PRN
  Administered 2019-02-15: 40 mL via INTRAVENOUS

## 2019-02-15 MED ORDER — METHOTREXATE 2.5 MG PO TABS
20.0000 mg | ORAL_TABLET | ORAL | Status: DC
  Administered 2019-02-15: 11:00:00 20 mg via ORAL
  Filled 2019-02-15: qty 8

## 2019-02-15 MED ORDER — ASPIRIN 300 MG RE SUPP
300.0000 mg | Freq: Every day | RECTAL | Status: DC
  Administered 2019-02-16: 300 mg via RECTAL
  Filled 2019-02-15: qty 1

## 2019-02-15 MED ORDER — NITROGLYCERIN 1 MG/10 ML FOR IR/CATH LAB
INTRA_ARTERIAL | Status: AC
  Filled 2019-02-15: qty 10

## 2019-02-15 MED ORDER — IOHEXOL 350 MG/ML SOLN
100.0000 mL | Freq: Once | INTRAVENOUS | Status: AC | PRN
  Administered 2019-02-15: 100 mL via INTRAVENOUS

## 2019-02-15 MED ORDER — SODIUM CHLORIDE 0.9 % IV SOLN
INTRAVENOUS | Status: DC | PRN
  Administered 2019-02-15: 40 ug/min via INTRAVENOUS

## 2019-02-15 MED ORDER — SODIUM CHLORIDE (PF) 0.9 % IJ SOLN
INTRAMUSCULAR | Status: AC
  Filled 2019-02-15: qty 50

## 2019-02-15 MED ORDER — AMLODIPINE BESYLATE 5 MG PO TABS
5.0000 mg | ORAL_TABLET | Freq: Every day | ORAL | Status: DC
  Administered 2019-02-15 – 2019-02-18 (×4): 5 mg via ORAL
  Filled 2019-02-15 (×5): qty 1

## 2019-02-15 MED ORDER — PANTOPRAZOLE SODIUM 40 MG IV SOLR
40.0000 mg | INTRAVENOUS | Status: DC
  Administered 2019-02-15 – 2019-02-19 (×5): 40 mg via INTRAVENOUS
  Filled 2019-02-15 (×5): qty 40

## 2019-02-15 MED ORDER — STROKE: EARLY STAGES OF RECOVERY BOOK
Freq: Once | Status: AC
  Administered 2019-02-21: 03:00:00
  Filled 2019-02-15: qty 1

## 2019-02-15 MED ORDER — SODIUM CHLORIDE 0.9 % IV SOLN: INTRAVENOUS | Status: DC

## 2019-02-15 MED ORDER — ESMOLOL HCL 100 MG/10ML IV SOLN
INTRAVENOUS | Status: DC | PRN
  Administered 2019-02-15 (×2): 20 mg via INTRAVENOUS
  Administered 2019-02-16: 40 mg via INTRAVENOUS

## 2019-02-15 MED ORDER — SUCCINYLCHOLINE CHLORIDE 20 MG/ML IJ SOLN
INTRAMUSCULAR | Status: DC | PRN
  Administered 2019-02-15: 120 mg via INTRAVENOUS

## 2019-02-15 NOTE — Progress Notes (Signed)
Writer talked to MD Doylene Canard) and he has come to room. IV Ativan 0.5 mg given at this time. MD placing new orders. Daughter-in-law at bedside, stating pt is talking in a language she does not understand. Pt lying in bed, yelling out at times.

## 2019-02-15 NOTE — Sedation Documentation (Signed)
Spoke with Malachy Mood in pt transport and requested 4N27 be brought to IR2.

## 2019-02-15 NOTE — Sedation Documentation (Signed)
Spoke with Sherida in pt placement. Pt will go to 4N27.

## 2019-02-15 NOTE — Consult Note (Addendum)
Neurology Consultation Reason for Consult: Confusion Referring Physician: Algie Coffer, A  CC: Confusion  History is obtained from:patient's family  HPI: Victoria Cox is a 58 y.o. female with a history of rheumatoid arthritis who presents with a several week history of myalgias, headache, mouth sores, generalized weakness. She was confused on the night of admission, 7/18 but then cleared and was acting appropriately until this afternoon. A nurse assisted the patient to the bathroom at 3:30 pm, and she was able to speak normally then. Around 5 pm, the patient began to cry out in her language "I'm hurting, stop, wait." the words are all similar, but there are multiple words which she uses. Due to the confusion, neurology was consulted. I saw the patient at approximately 8 pm with Dr. Algie Coffer, and she was taken for stat CT/CTA to rule out hemorrhage or stroke.  There was significant delay obtaining a CT angiogram due to IV access, finally being completed around 9:45 PM.  Once I was notified around 10 PM with potential LVO, [facility] was notified and the patient was transferred emergently to IR.   LKW: 3:30 pm tpa given?: no, stroke not initially suspected, unclear time of onset.  MRS: 1 NIHSS prior to procedure: 7 (2 for aphasia, 2 for commands, 2 for questions, 1 for facial weakness)   ROS:  Unable to obtain due to altered mental status.   Past Medical History:  Diagnosis Date  . Arthritis   . Diabetes (HCC)   . High blood pressure      Family History  Problem Relation Age of Onset  . Hypertension Mother      Social History:  reports that she has never smoked. She has never used smokeless tobacco. She reports that she does not drink alcohol or use drugs.   Exam: Current vital signs: BP (!) 179/143 (BP Location: Left Arm)   Pulse 94   Temp 98.7 F (37.1 C) (Oral)   Resp 16   Ht 5\' 3"  (1.6 m)   Wt 84.6 kg   SpO2 98%   BMI 33.04 kg/m  Vital signs in last 24 hours: Temp:  [98.4  F (36.9 C)-98.7 F (37.1 C)] 98.7 F (37.1 C) (07/21 2005) Pulse Rate:  [78-94] 94 (07/21 2005) Resp:  [16-20] 16 (07/21 2005) BP: (136-179)/(80-143) 179/143 (07/21 2005) SpO2:  [94 %-98 %] 98 % (07/21 2005)   Physical Exam  Constitutional: Appears well-developed and well-nourished.  Psych: Affect appropriate to situation Eyes: No scleral injection HENT: No OP obstrucion Head: Normocephalic.  Cardiovascular: Normal rate and regular rhythm.  Respiratory: Effort normal, non-labored breathing GI: Soft.  No distension. There is no tenderness.  Skin: WDI  Neuro: Mental Status: Patient is awake, alert, does not follow commands, only states "stop, wait, don't" in her language(daughter translates.  Cranial Nerves: II: She clearly fixates and tracks, but does not blink to threat from either direction. . Pupils are equal, round, and reactive to light.   III,IV, VI: Though she is able to cross midline to the right, she does appear to have left gaze preference. V: Facial sensation is symmetric to temperature VII: Facial movement R NL flattening VIII: hearing is intact to voice X: Uvula elevates symmetrically XI: Shoulder shrug is symmetric. XII: tongue is midline without atrophy or fasciculations.  Motor: Tone is normal. Bulk is normal. 5/5 strength was present in all four extremities.  Sensory: She responds to noxious simulation all 4 extremities Cerebellar: No clear ataxia, but she does not cooperate with formal  testing   I have reviewed labs in epic and the results pertinent to this consultation are: Sodium 132 Glucose 175 Creatinine 0.59  I have reviewed the images obtained: CT/CTA-aspects of 10, there is a paucity of vessels on the left when compared to the right radiology suspected M2 occlusion. CT perfusion- though using typical cutoffs does not include any significant areas of penumbra, there is a significantly increased mean transit time and decreased cerebral blood flow  on the left compared to the right.  Impression: 58 year old female with abrupt onset aphasia, right nasolabial fold flattening with attenuation of vessels on the left and concern for possible M2 occlusion.  Though her perfusion is not typical for an abrupt occlusion, given that she is symptomatic and there is some evidence of decreased blood flow on that side, after discussion with Dr. Estanislado Pandy, we did decide to perform a cerebral angiogram.  She was unfortunately, far too uncooperative and agitated for this to be done without general anesthesia and therefore she was intubated for this procedure.  Given the asymmetry of the vasculature on CT angiogram, I still think that cerebral ischemia is certainly possible to be the etiology of her symptoms.  There is no evidence angiographically of vasculitis.    Rheumatoid meningitis can present with strokelike episodes of unclear pathophysiology.  The abrupt change would argue against toxic/metabolic etiology to her confusion.  Seizure would also be a consideration.  Recommendations: 1) MRI brain with/without contrast 2) EEG 3) BP goal 120 - 180 4) echo, lipids, a1c 5) PT, OT, ST  This patient is critically ill and at significant risk of neurological worsening, death and care requires constant monitoring of vital signs, hemodynamics,respiratory and cardiac monitoring, neurological assessment, discussion with family, other specialists and medical decision making of high complexity. I spent 90 minutes of neurocritical care time  in the care of  this patient. This was time spent independent of any time provided by nurse practitioner or PA.  Roland Rack, MD [facility] [phone]  If 7pm- 7am, please page neurology on call as listed in [place].

## 2019-02-15 NOTE — Progress Notes (Signed)
Ref: [name], MD   Subjective:  Weakness, headache and neck pain persist.  She is off methotrexate x 2 weeks. Humira is continued with next dose due 10 days from now.  Hypokalemia is corrected. Hyponatremia persist. TSH, T3, T4 are near normal.  AM Cortisol level is normal. CK levels are low. Oral intake remains poor but denies difficulty swallowing.  In absence of fever, rash, mental confusion, visual disturbance etc meningitis, West Nile virus infection appears unlikely.  VS are stable.  Objective:  Vital Signs in the last 24 hours: Temp:  [97.6 F (36.4 C)-98.5 F (36.9 C)] 98.5 F (36.9 C) (07/21 0549) Pulse Rate:  [78-106] 78 (07/21 0549) Resp:  [16-20] 16 (07/21 0549) BP: (136-156)/(61-103) 155/92 (07/21 0549) SpO2:  [94 %-99 %] 94 % (07/21 0549)  Physical Exam: BP Readings from Last 1 Encounters:  02/15/19 (!) 155/92     Wt Readings from Last 1 Encounters:  02/13/19 84.6 kg    Weight change:  Body mass index is 33.04 kg/m. HEENT: [place]/AT, Eyes-Brown, PERL, EOMI, Conjunctiva-Pink, Sclera-Non-icteric Neck: No JVD, No bruit, Trachea midline. Bilateral back of neck muscles tenderness on palpation. Mild tenderness of sinuses bilateral. Lungs:  Clear, Bilateral. Cardiac:  Regular rhythm, normal S1 and S2, no S3. II/VI systolic murmur. Abdomen:  Soft, mild epigastric area-tender on deep palpation. BS present. Extremities:  No edema present. No cyanosis. No clubbing. CNS: AxOx3, Cranial nerves grossly intact, moves all 4 extremities.  Skin: Warm and dry.   Intake/Output from previous day: 07/20 0701 - 07/21 0700 In: 1052 [P.O.:831; I.V.:221] Out: -     Lab Results: BMET    Component Value Date/Time   NA 130 (L) 02/15/2019 0632   NA 133 (L) 02/13/2019 0629   NA 132 (L) 02/12/2019 1857   NA 139 11/30/2014 1001   K 3.7 02/15/2019 0632   K 3.4 (L) 02/13/2019 0629   K 3.9 02/12/2019 1857   CL 97 (L) 02/15/2019 0632   CL 97 (L) 02/13/2019 0629   CL 96 (L)  02/12/2019 1857   CO2 22 02/15/2019 0632   CO2 22 02/13/2019 0629   CO2 22 02/12/2019 1857   GLUCOSE 128 (H) 02/15/2019 0632   GLUCOSE 113 (H) 02/13/2019 0629   GLUCOSE 158 (H) 02/12/2019 1857   BUN 7 02/15/2019 0632   BUN 11 02/13/2019 0629   BUN 18 02/12/2019 1857   BUN 11 11/30/2014 1001   CREATININE 0.56 02/15/2019 0632   CREATININE 0.49 02/13/2019 0629   CREATININE 0.67 02/12/2019 1857   CALCIUM 8.7 (L) 02/15/2019 0632   CALCIUM 8.8 (L) 02/13/2019 0629   CALCIUM 9.3 02/12/2019 1857   GFRNONAA >60 02/15/2019 0632   GFRNONAA >60 02/13/2019 0629   GFRNONAA >60 02/12/2019 1857   GFRAA >60 02/15/2019 0632   GFRAA >60 02/13/2019 0629   GFRAA >60 02/12/2019 1857   CBC    Component Value Date/Time   WBC 6.5 02/14/2019 0903   RBC 4.18 02/14/2019 0903   HGB 11.7 (L) 02/14/2019 0903   HGB 12.5 11/30/2014 1001   HCT 37.2 02/14/2019 0903   HCT 38.6 11/30/2014 1001   PLT 398 02/14/2019 0903   PLT 369 11/30/2014 1001   MCV 89.0 02/14/2019 0903   MCV 83 11/30/2014 1001   MCH 28.0 02/14/2019 0903   MCHC 31.5 02/14/2019 0903   RDW 14.8 02/14/2019 0903   RDW 14.1 11/30/2014 1001   LYMPHSABS 1.4 02/12/2019 1857   LYMPHSABS 2.2 11/30/2014 1001   MONOABS 0.8  02/12/2019 1857   EOSABS 0.0 02/12/2019 1857   EOSABS 0.2 11/30/2014 1001   BASOSABS 0.0 02/12/2019 1857   BASOSABS 0.0 11/30/2014 1001   HEPATIC Function Panel Recent Labs    02/12/19 1857  PROT 8.1   HEMOGLOBIN A1C No components found for: HGA1C,  MPG CARDIAC ENZYMES Lab Results  Component Value Date   CKTOTAL 18 (L) 02/14/2019   CKMB 0.9 02/14/2019   BNP No results for input(s): PROBNP in the last 8760 hours. TSH Recent Labs    02/13/19 0629  TSH 0.367   CHOLESTEROL No results for input(s): CHOL in the last 8760 hours.  Scheduled Meds: . aspirin EC  81 mg Oral Daily  . enoxaparin (LOVENOX) injection  40 mg Subcutaneous QHS  . feeding supplement (ENSURE ENLIVE)  237 mL Oral BID BM  . folic acid  2 mg  Oral Daily  . insulin aspart  0-9 Units Subcutaneous TID WC  . irbesartan  75 mg Oral Daily  . levothyroxine  75 mcg Oral Q0600  . methotrexate  20 mg Oral Weekly  . metoprolol succinate  50 mg Oral Daily  . multivitamin with minerals  1 tablet Oral Daily  . ondansetron (ZOFRAN) IV  4 mg Intravenous TID AC  . pantoprazole (PROTONIX) IV  40 mg Intravenous Q24H  . tiZANidine  2 mg Oral Once   Continuous Infusions: . 0.9 % NaCl with KCl 20 mEq / L 50 mL/hr at 02/15/19 0707  . cefTRIAXone (ROCEPHIN)  IV 1 g (02/14/19 2151)   PRN Meds:.acetaminophen, Muscle Rub  Assessment/Plan: Generalized weakness Myalgias Neck pain Headache Hypertension Type 2 DM Morbid obesity Rheumatoid arthritis  Continue low dose IV fluid. Encourage PO feedings. Scheduled Zofran use. Change Protonix to IV. Try Tizanidine for neck muscle spasm/pain Resume methotrexate Appreciate infectious disease consult. Discussed care with patient and daughter in law.    LOS: 3 days   Time spent including chart review, lab review, examination, discussion with patient : 40 min   Dixie Dials  MD  02/15/2019, 8:47 AM

## 2019-02-15 NOTE — ED Notes (Addendum)
PT arrived to [facility] ED at 2240.  Straight to CT 1 for perfusion with Claiborne Billings, RN, Shanon Brow, RRT, and Dr. Leonel Ramsay.  Pt going from CT to IR.

## 2019-02-15 NOTE — Sedation Documentation (Signed)
Spoke with [place], [place], 4N charge. Confirmed bed assignment and requested monitor and O2 tank be placed on bed for transport.

## 2019-02-15 NOTE — Anesthesia Preprocedure Evaluation (Addendum)
Anesthesia Evaluation   Patient confused    Reviewed: Allergy & Precautions, Patient's Chart, lab work & pertinent test results, reviewed documented beta blocker date and time , Unable to perform ROS - Chart review onlyPreop documentation limited or incomplete due to emergent nature of procedure.  Airway   TM Distance: >3 FB    Comment:  Unable to check mallampati given mental status  Dental   Pulmonary    breath sounds clear to auscultation       Cardiovascular hypertension, Pt. on home beta blockers and Pt. on medications  Rhythm:Regular Rate:Tachycardia     Neuro/Psych CVA, Residual Symptoms    GI/Hepatic GERD  Medicated,  Endo/Other  diabetes, Type 2, Oral Hypoglycemic Agents, Insulin DependentHypothyroidism  Obesity Hyponatremia   Renal/GU      Musculoskeletal  (+) Arthritis ,   Abdominal   Peds  Hematology   Anesthesia Other Findings   Reproductive/Obstetrics                            Anesthesia Physical Anesthesia Plan  ASA: III and emergent  Anesthesia Plan: General   Post-op Pain Management:    Induction: Intravenous and Rapid sequence  PONV Risk Score and Plan: 3 and Treatment may vary due to age or medical condition, Ondansetron and Dexamethasone  Airway Management Planned: Oral ETT  Additional Equipment: Arterial line  Intra-op Plan:   Post-operative Plan: Possible Post-op intubation/ventilation  Informed Consent:     History available from chart only and Only emergency history available  Plan Discussed with: CRNA and Anesthesiologist  Anesthesia Plan Comments:        Anesthesia Quick Evaluation

## 2019-02-15 NOTE — Anesthesia Procedure Notes (Signed)
Procedure Name: Intubation Date/Time: 02/15/2019 11:20 PM Performed by: Clovis Cao, CRNA Pre-anesthesia Checklist: Patient identified, Emergency Drugs available, Suction available, Patient being monitored and Timeout performed Patient Re-evaluated:Patient Re-evaluated prior to induction Oxygen Delivery Method: Circle system utilized Preoxygenation: Pre-oxygenation with 100% oxygen Induction Type: IV induction, Rapid sequence and Cricoid Pressure applied Laryngoscope Size: Miller and 2 Grade View: Grade I Tube type: Oral Tube size: 8.0 mm Number of attempts: 1 Airway Equipment and Method: Stylet Placement Confirmation: ETT inserted through vocal cords under direct vision,  positive ETCO2 and breath sounds checked- equal and bilateral Secured at: 22 cm Tube secured with: Tape Dental Injury: Teeth and Oropharynx as per pre-operative assessment

## 2019-02-15 NOTE — ED Notes (Signed)
Pt arrives via [facility] from [facility] ICU, on arrival, pt is combative, restless, immediate order for Ativan 2 mg IV given in CT, neurologist in ct with pt. CT currently in progress.

## 2019-02-15 NOTE — Progress Notes (Signed)
Writer ran to pt's room when hearing the pt screaming out and talking in foreign language, jumping out of bed. Tried to take pt to bathroom and ask if pt in pain. Cannot understand her. Called pt's daughter-in-law and tried to get her to talk to pt and pt would not talk to her over phone.  Staying at bedside.

## 2019-02-15 NOTE — Progress Notes (Signed)
50 mcg Fentanyl wasted in stericycle, witnessed by sarabeth RN

## 2019-02-15 NOTE — Progress Notes (Signed)
Awaiting Neurologist to see pt. Son has now arrived and in room.

## 2019-02-15 NOTE — Progress Notes (Signed)
Called to CT scan for possible CVA pt, upon arrival pt confused, agitated and yelling has primary RN, 2 MD's and CT staff at bedside.  2nd IV site attempting to be placed, multiple attempts, successfully placed by IV team.  MD's at bedside CODE STROKE not activated per MD awaiting CT results (LSN) about 1530 per MD.  Unable to perform NIHSS pt is definitely awake, screaming and fighting also grips equal in strength, and grimace symmetrical.  Pt given 50 mcg fentanyl IV per MD orders to attempt to complete CT scan.  Language barrier MD able to translate/speak with pt and her family also.  Pt transferred to ICU per MD orders.  2150 MD at bedside confirming pt has indeed had a CVA.  Pt will be emergently transferred to [facility].

## 2019-02-15 NOTE — Progress Notes (Signed)
Previous note at 1700 today, not 1800.

## 2019-02-15 NOTE — Progress Notes (Signed)
Ref: Dixie Dials, MD   Subjective:  Confused. Yelling incomprehensible words. VS stable. Per nurse patient ws OK about an hour ago. Had fair oral intake for lunch per nurse and family member. Blood sugar 129 mg/dL.  Objective:  Vital Signs in the last 24 hours: Temp:  [98.4 F (36.9 C)-98.6 F (37 C)] 98.6 F (37 C) (07/21 1346) Pulse Rate:  [78-82] 82 (07/21 1346) Resp:  [16-20] 20 (07/21 1346) BP: (136-155)/(80-92) 152/80 (07/21 1346) SpO2:  [94 %-95 %] 95 % (07/21 1346)  Physical Exam: BP Readings from Last 1 Encounters:  02/15/19 (!) 152/80     Wt Readings from Last 1 Encounters:  02/13/19 84.6 kg    Weight change:  Body mass index is 33.04 kg/m. HEENT: [place]/AT, Eyes-Brown, Conjunctiva-Pink, Sclera-Non-icteric Neck: No JVD, No bruit, Trachea midline. Lungs:  Clear, Bilateral. Cardiac:  Regular rhythm, normal S1 and S2, no S3. II/VI systolic murmur. Abdomen:  Soft, non-tender. BS present. Extremities:  No edema present. No cyanosis. No clubbing. CNS: AxOx0, Cranial nerves grossly intact, uncooperative.  Skin: Warm and dry.   Intake/Output from previous day: 07/20 0701 - 07/21 0700 In: 1052 [P.O.:831; I.V.:221] Out: -     Lab Results: BMET    Component Value Date/Time   NA 131 (L) 02/15/2019 0920   NA 130 (L) 02/15/2019 0632   NA 133 (L) 02/13/2019 0629   NA 139 11/30/2014 1001   K 3.6 02/15/2019 0920   K 3.7 02/15/2019 0632   K 3.4 (L) 02/13/2019 0629   CL 97 (L) 02/15/2019 0920   CL 97 (L) 02/15/2019 0632   CL 97 (L) 02/13/2019 0629   CO2 23 02/15/2019 0920   CO2 22 02/15/2019 0632   CO2 22 02/13/2019 0629   GLUCOSE 193 (H) 02/15/2019 0920   GLUCOSE 128 (H) 02/15/2019 0632   GLUCOSE 113 (H) 02/13/2019 0629   BUN 8 02/15/2019 0920   BUN 7 02/15/2019 0632   BUN 11 02/13/2019 0629   BUN 11 11/30/2014 1001   CREATININE 0.61 02/15/2019 0920   CREATININE 0.56 02/15/2019 0632   CREATININE 0.49 02/13/2019 0629   CALCIUM 8.8 (L) 02/15/2019 0920    CALCIUM 8.7 (L) 02/15/2019 0632   CALCIUM 8.8 (L) 02/13/2019 0629   GFRNONAA >60 02/15/2019 0920   GFRNONAA >60 02/15/2019 0632   GFRNONAA >60 02/13/2019 0629   GFRAA >60 02/15/2019 0920   GFRAA >60 02/15/2019 0632   GFRAA >60 02/13/2019 0629   CBC    Component Value Date/Time   WBC 6.5 02/14/2019 0903   RBC 4.18 02/14/2019 0903   HGB 11.7 (L) 02/14/2019 0903   HGB 12.5 11/30/2014 1001   HCT 37.2 02/14/2019 0903   HCT 38.6 11/30/2014 1001   PLT 398 02/14/2019 0903   PLT 369 11/30/2014 1001   MCV 89.0 02/14/2019 0903   MCV 83 11/30/2014 1001   MCH 28.0 02/14/2019 0903   MCHC 31.5 02/14/2019 0903   RDW 14.8 02/14/2019 0903   RDW 14.1 11/30/2014 1001   LYMPHSABS 1.4 02/12/2019 1857   LYMPHSABS 2.2 11/30/2014 1001   MONOABS 0.8 02/12/2019 1857   EOSABS 0.0 02/12/2019 1857   EOSABS 0.2 11/30/2014 1001   BASOSABS 0.0 02/12/2019 1857   BASOSABS 0.0 11/30/2014 1001   HEPATIC Function Panel Recent Labs    02/12/19 1857  PROT 8.1   HEMOGLOBIN A1C No components found for: HGA1C,  MPG CARDIAC ENZYMES Lab Results  Component Value Date   CKTOTAL 18 (L) 02/14/2019   CKMB  0.9 02/14/2019   BNP No results for input(s): PROBNP in the last 8760 hours. TSH Recent Labs    02/13/19 0629  TSH 0.367   CHOLESTEROL No results for input(s): CHOL in the last 8760 hours.  Scheduled Meds: . amLODipine  5 mg Oral Daily  . cyanocobalamin  1,000 mcg Intramuscular Once  . enoxaparin (LOVENOX) injection  40 mg Subcutaneous QHS  . folic acid  2 mg Oral Daily  . folic acid  1 mg Intravenous Once  . insulin aspart  0-9 Units Subcutaneous TID WC  . irbesartan  75 mg Oral Daily  . levothyroxine  75 mcg Oral Q0600  . LORazepam  0.5 mg Intravenous Once  . methotrexate  20 mg Oral Weekly  . metoprolol succinate  50 mg Oral Daily  . multivitamin with minerals  1 tablet Oral Daily  . ondansetron (ZOFRAN) IV  4 mg Intravenous TID AC  . pantoprazole (PROTONIX) IV  40 mg Intravenous Q24H    Continuous Infusions: . 0.9 % NaCl with KCl 20 mEq / L 50 mL/hr at 02/15/19 0707  . cefTRIAXone (ROCEPHIN)  IV 1 g (02/14/19 2151)   PRN Meds:.acetaminophen, Muscle Rub  Assessment/Plan: Altered mentation Generalized weakness Neck pain Headache HTN Type 2 DM Morbid obesity Rheumatoid arthritis  CT head. Small dose Lorazepam. IV folic acid and IM B-12 1000 mcg. injection.   LOS: 3 days   Time spent including chart review, lab review, examination, discussion with patient : 25 min   [name]  MD  02/15/2019, 6:38 PM

## 2019-02-16 ENCOUNTER — Inpatient Hospital Stay (HOSPITAL_COMMUNITY): Payer: Medicaid Other

## 2019-02-16 ENCOUNTER — Encounter (HOSPITAL_COMMUNITY): Payer: Self-pay | Admitting: Interventional Radiology

## 2019-02-16 DIAGNOSIS — M0600 Rheumatoid arthritis without rheumatoid factor, unspecified site: Secondary | ICD-10-CM

## 2019-02-16 DIAGNOSIS — R531 Weakness: Secondary | ICD-10-CM

## 2019-02-16 DIAGNOSIS — R0689 Other abnormalities of breathing: Secondary | ICD-10-CM

## 2019-02-16 DIAGNOSIS — R627 Adult failure to thrive: Secondary | ICD-10-CM

## 2019-02-16 DIAGNOSIS — I639 Cerebral infarction, unspecified: Secondary | ICD-10-CM

## 2019-02-16 DIAGNOSIS — L989 Disorder of the skin and subcutaneous tissue, unspecified: Secondary | ICD-10-CM

## 2019-02-16 DIAGNOSIS — R509 Fever, unspecified: Secondary | ICD-10-CM

## 2019-02-16 DIAGNOSIS — G9341 Metabolic encephalopathy: Secondary | ICD-10-CM

## 2019-02-16 DIAGNOSIS — R5383 Other fatigue: Secondary | ICD-10-CM

## 2019-02-16 DIAGNOSIS — G9340 Encephalopathy, unspecified: Secondary | ICD-10-CM

## 2019-02-16 DIAGNOSIS — E871 Hypo-osmolality and hyponatremia: Secondary | ICD-10-CM

## 2019-02-16 HISTORY — PX: IR ANGIO VERTEBRAL SEL VERTEBRAL BILAT MOD SED: IMG5369

## 2019-02-16 HISTORY — PX: IR ANGIO INTRA EXTRACRAN SEL COM CAROTID INNOMINATE BILAT MOD SED: IMG5360

## 2019-02-16 HISTORY — PX: IR ANGIO EXTERNAL CAROTID SEL EXT CAROTID UNI R MOD SED: IMG5371

## 2019-02-16 LAB — LIPID PANEL
Cholesterol: 167 mg/dL (ref 0–200)
HDL: 30 mg/dL — ABNORMAL LOW (ref >40)
LDL Cholesterol: 85 mg/dL (ref 0–99)
Total CHOL/HDL Ratio: 5.6 RATIO
Triglycerides: 262 mg/dL — ABNORMAL HIGH (ref <150)
VLDL: 52 mg/dL — ABNORMAL HIGH (ref 0–40)

## 2019-02-16 LAB — GLUCOSE, CAPILLARY
Glucose-Capillary: 135 mg/dL — ABNORMAL HIGH (ref 70–99)
Glucose-Capillary: 135 mg/dL — ABNORMAL HIGH (ref 70–99)
Glucose-Capillary: 111 mg/dL — ABNORMAL HIGH (ref 70–99)
Glucose-Capillary: 152 mg/dL — ABNORMAL HIGH (ref 70–99)

## 2019-02-16 LAB — CRYPTOCOCCAL ANTIGEN, CSF: Crypto Ag: NEGATIVE

## 2019-02-16 LAB — POCT I-STAT 7, (LYTES, BLD GAS, ICA,H+H)
Acid-base deficit: 3 mmol/L — ABNORMAL HIGH (ref 0.0–2.0)
Bicarbonate: 20.8 mmol/L (ref 20.0–28.0)
Calcium, Ion: 1.16 mmol/L (ref 1.15–1.40)
HCT: 39 % (ref 36.0–46.0)
Hemoglobin: 13.3 g/dL (ref 12.0–15.0)
O2 Saturation: 98 %
Patient temperature: 99.3
Potassium: 3.1 mmol/L — ABNORMAL LOW (ref 3.5–5.1)
Sodium: 133 mmol/L — ABNORMAL LOW (ref 135–145)
TCO2: 22 mmol/L (ref 22–32)
pCO2 arterial: 31.7 mmHg — ABNORMAL LOW (ref 32.0–48.0)
pH, Arterial: 7.427 (ref 7.350–7.450)
pO2, Arterial: 98 mmHg (ref 83.0–108.0)

## 2019-02-16 LAB — HEMOGLOBIN A1C
Hgb A1c MFr Bld: 7.2 % — ABNORMAL HIGH (ref 4.8–5.6)
Mean Plasma Glucose: 159.94 mg/dL

## 2019-02-16 LAB — ECHOCARDIOGRAM COMPLETE
Height: 63 in
Weight: 3178.15 oz

## 2019-02-16 LAB — PROTEIN AND GLUCOSE, CSF
Glucose, CSF: 46 mg/dL (ref 40–70)
Total  Protein, CSF: 103 mg/dL — ABNORMAL HIGH (ref 15–45)

## 2019-02-16 LAB — MRSA PCR SCREENING: MRSA by PCR: NEGATIVE

## 2019-02-16 MED ORDER — GADOBUTROL 1 MMOL/ML IV SOLN
8.5000 mL | Freq: Once | INTRAVENOUS | Status: AC | PRN
  Administered 2019-02-16: 8.5 mL via INTRAVENOUS

## 2019-02-16 MED ORDER — CHLORHEXIDINE GLUCONATE CLOTH 2 % EX PADS
6.0000 | MEDICATED_PAD | Freq: Every day | CUTANEOUS | Status: DC
  Administered 2019-02-16 – 2019-02-18 (×2): 6 via TOPICAL

## 2019-02-16 MED ORDER — CLOPIDOGREL BISULFATE 75 MG PO TABS
75.0000 mg | ORAL_TABLET | Freq: Every day | ORAL | Status: DC
  Administered 2019-02-16 – 2019-02-21 (×6): 75 mg via ORAL
  Filled 2019-02-16 (×6): qty 1

## 2019-02-16 MED ORDER — NICARDIPINE HCL IN NACL 20-0.86 MG/200ML-% IV SOLN
3.0000 mg/h | INTRAVENOUS | Status: DC
  Administered 2019-02-16: 01:00:00 5 mg/h via INTRAVENOUS
  Filled 2019-02-16 (×2): qty 200

## 2019-02-16 MED ORDER — POTASSIUM CHLORIDE CRYS ER 20 MEQ PO TBCR
60.0000 meq | EXTENDED_RELEASE_TABLET | Freq: Once | ORAL | Status: AC
  Administered 2019-02-16: 16:00:00 60 meq via ORAL
  Filled 2019-02-16: qty 3

## 2019-02-16 MED ORDER — FENTANYL CITRATE (PF) 100 MCG/2ML IJ SOLN
50.0000 ug | INTRAMUSCULAR | Status: DC | PRN
  Administered 2019-02-16: 50 ug via INTRAVENOUS

## 2019-02-16 MED ORDER — DEXTROSE 5 % IV SOLN
10.0000 mg/kg | Freq: Three times a day (TID) | INTRAVENOUS | Status: DC
  Administered 2019-02-16 – 2019-02-18 (×5): 675 mg via INTRAVENOUS
  Filled 2019-02-16 (×9): qty 13.5

## 2019-02-16 MED ORDER — LIDOCAINE HCL (PF) 1 % IJ SOLN
INTRAMUSCULAR | Status: AC
  Filled 2019-02-16: qty 5

## 2019-02-16 MED ORDER — CHLORHEXIDINE GLUCONATE 0.12% ORAL RINSE (MEDLINE KIT)
15.0000 mL | Freq: Two times a day (BID) | OROMUCOSAL | Status: DC
  Administered 2019-02-16: 15 mL via OROMUCOSAL

## 2019-02-16 MED ORDER — ORAL CARE MOUTH RINSE
15.0000 mL | OROMUCOSAL | Status: DC
  Administered 2019-02-16 (×4): 15 mL via OROMUCOSAL

## 2019-02-16 MED ORDER — SODIUM CHLORIDE 0.9 % IV SOLN
INTRAVENOUS | Status: DC
  Administered 2019-02-16 – 2019-02-17 (×3): via INTRAVENOUS

## 2019-02-16 MED ORDER — FENTANYL CITRATE (PF) 100 MCG/2ML IJ SOLN
50.0000 ug | INTRAMUSCULAR | Status: DC | PRN
  Filled 2019-02-16: qty 4

## 2019-02-16 MED ORDER — PROPOFOL 1000 MG/100ML IV EMUL
0.0000 ug/kg/min | INTRAVENOUS | Status: DC
  Administered 2019-02-16: 30 ug/kg/min via INTRAVENOUS
  Filled 2019-02-16: qty 100

## 2019-02-16 MED ORDER — POTASSIUM CHLORIDE 20 MEQ/15ML (10%) PO SOLN: 60.0000 meq | Freq: Once | ORAL | Status: DC

## 2019-02-16 NOTE — Progress Notes (Signed)
NAME:  Victoria Cox, MRN:  [id number], DOB:  October 09, 1960, LOS: 4 ADMISSION DATE:  02/12/2019, CONSULTATION DATE: 02/16/2019 REFERRING MD: Cardiology, CHIEF COMPLAINT: Altered mental status  Brief History   58 year old lady with altered mental status intubated postprocedure History of present illness   Victoria Cox is a 58 year old lady with history of rheumatoid arthritis and diabetes mellitus type 2 who was admitted with generalized body aches and poor appetite under cardiology.  Over the last day or so he became very confused and agitated and there was concern for acute stroke patient was taken to interventional radiology under angiogram showed no occlusion.  Patient came back from there intubated due to agitation during procedure.  MRI was ordered and is pending.  Obviously not able to get any history from the patient due to patient being intubated and sedated.  Past Medical History   Past Medical History:  Diagnosis Date  . Arthritis   . Diabetes ([place])   . High blood pressure     Significant Hospital Events    Rheumatoid arthritis Type 2 diabetes mellitus  Procedures:  IR arteriogram  Significant Diagnostic Tests:  IR arteriogram S/P 4 vessel cerebral arteriogram RT CFA approach. Findings. 1.No arterial occlusions,intraluminal filling defects  Or dissections seen.. 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   Interim history/subjective:  Significant agitation this morning   Objective   Blood pressure 136/76, pulse (!) 103, temperature (!) 102.4 F (39.1 C), temperature source Axillary, resp. rate (!) 27, height 5\' 3"  (1.6 m), weight 90.1 kg, SpO2 100 %.    Vent Mode: CPAP;PSV FiO2 (%):  [40 %] 40 % Set Rate:  [18 bmp] 18 bmp Vt Set:  [420 mL] 420 mL PEEP:  [5 cmH20] 5 cmH20 Pressure Support:  [5 cmH20] 5 cmH20 Plateau Pressure:  [15 cmH20-18 cmH20] 15 cmH20   Intake/Output Summary (Last 24 hours) at 02/16/2019 0915 Last data filed  at 02/16/2019 0900 Gross per 24 hour  Intake 3481.11 ml  Output 1650 ml  Net 1831.11 ml   Filed Weights   02/13/19 1514 02/16/19 0722  Weight: 84.6 kg 90.1 kg    Examination: General: Middle-aged lady, agitated at this morning HENT: Moist oral mucosa Lungs: Clear breath sounds Cardiovascular: S1-S2 appreciated Abdomen: Bowel sounds appreciated Extremities: No clubbing, no edema Neuro: All extremities GU: Voiding  Resolved Hospital Problem list     Assessment & Plan:   Acute respiratory insufficiency requiring mechanical ventilation -Was tolerating pressure support this morning but very agitated -Hemodynamics have been stable  On exam Patient appears to be stable enough for extubation will tolerate and extubate this morning as she has no underlying lung disease known  Best practice:  Diet: N.p.o. VAP protocol (if indicated): Will discontinue DVT prophylaxis: scd Mobility: Bedrest Code Status: Full code Family Communication:  Disposition: icu  Labs   CBC: Recent Labs  Lab 02/12/19 1857 02/13/19 0629 02/14/19 0903 02/15/19 2127 02/16/19 0135  WBC 8.9 7.0 6.5 12.9*  --   NEUTROABS 6.5  --   --  10.6*  --   HGB 12.4 11.9* 11.7* 12.7 13.3  HCT 39.0 38.7 37.2 41.3 39.0  MCV 86.5 88.0 89.0 86.0  --   PLT 396 360 398 470*  --     Basic Metabolic Panel: Recent Labs  Lab 02/12/19 1857 02/13/19 0629 02/15/19 0632 02/15/19 0920 02/15/19 2127 02/16/19 0135  NA 132* 133* 130* 131* 132* 133*  K 3.9 3.4* 3.7  3.6 3.5 3.1*  CL 96* 97* 97* 97* 95*  --   CO2 22 22 22 23 22   --   GLUCOSE 158* 113* 128* 193* 175*  --   BUN 18 11 7 8 7   --   CREATININE 0.67 0.49 0.56 0.61 0.59  --   CALCIUM 9.3 8.8* 8.7* 8.8* 8.9  --    GFR: Estimated Creatinine Clearance: 80.7 mL/min (by C-G formula based on SCr of 0.59 mg/dL). Recent Labs  Lab 02/12/19 1857 02/13/19 0629 02/14/19 0903 02/15/19 2127  WBC 8.9 7.0 6.5 12.9*  LATICACIDVEN 1.3  --   --   --     Liver  Function Tests: Recent Labs  Lab 02/12/19 1857  AST 19  ALT 20  ALKPHOS 87  BILITOT 0.4  PROT 8.1  ALBUMIN 3.5   Recent Labs  Lab 02/15/19 0920  LIPASE 29  AMYLASE 47   No results for input(s): AMMONIA in the last 168 hours.  ABG    Component Value Date/Time   PHART 7.427 02/16/2019 0135   PCO2ART 31.7 (L) 02/16/2019 0135   PO2ART 98.0 02/16/2019 0135   HCO3 20.8 02/16/2019 0135   TCO2 22 02/16/2019 0135   ACIDBASEDEF 3.0 (H) 02/16/2019 0135   O2SAT 98.0 02/16/2019 0135     Coagulation Profile: No results for input(s): INR, PROTIME in the last 168 hours.  Cardiac Enzymes: Recent Labs  Lab 02/14/19 0933  CKTOTAL 18*  CKMB 0.9    HbA1C: Hgb A1c MFr Bld  Date/Time Value Ref Range Status  02/16/2019 06:35 AM 7.2 (H) 4.8 - 5.6 % Final    Comment:    (NOTE) Pre diabetes:          5.7%-6.4% Diabetes:              >6.4% Glycemic control for   <7.0% adults with diabetes   02/12/2019 06:57 PM 7.1 (H) 4.8 - 5.6 % Final    Comment:    (NOTE) Pre diabetes:          5.7%-6.4% Diabetes:              >6.4% Glycemic control for   <7.0% adults with diabetes     CBG: Recent Labs  Lab 02/15/19 0809 02/15/19 1127 02/15/19 1622 02/15/19 2106 02/16/19 0742  GLUCAP 133* 196* 129* 162* 135*    Review of Systems:   Unobtainable  Past Medical History  She,  has a past medical history of Arthritis, Diabetes (HCC), and High blood pressure.   Surgical History    Past Surgical History:  Procedure Laterality Date  . RADIOLOGY WITH ANESTHESIA N/A 02/15/2019   Procedure: IR WITH ANESTHESIA;  Surgeon: 02/18/19, MD;  Location: [facility] OR;  Service: Radiology;  Laterality: N/A;     Social History   reports that she has never smoked. She has never used smokeless tobacco. She reports that she does not drink alcohol or use drugs.   Family History   Her family history includes Hypertension in her mother.   Allergies No Known Allergies

## 2019-02-16 NOTE — Procedures (Signed)
S/P 4 vessel cerebral arteriogram RT CFA approach. Findings. 1.No arterial occlusions,intraluminal filling defects  Or dissections seen.. 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  S.Darinda [place] MD

## 2019-02-16 NOTE — Progress Notes (Signed)
SPINAL TAP PROCEDURE NOTE   Indication : fever and altered mental status r/o men ingitis  Written informed consent was obtained from the patient's son at the bedside explaining risks benefits including pain, fever, infection and headache spinal tap was performed at the bedside under aseptic precautions.  The patient laying left lateral decubitus position.  Area over the lower lumbar spine was cleaned with Betadine x3.  5 cc of 1% lidocaine was infiltrated between the L4-5 space.  Clear spinal fluid was obtained upon second attempt.  CSF opening pressure was elevated at 44 cm and closing pressure at 15 cms  of CSF.  15 cc of spinal fluid was sent for evaluation for cell count, protein, glucose, gram smear, bacterial culture, fungal smear and TB smear and antigen.  Patient tolerated procedure well without any immediate complications.   Antony Contras, MD Medical Director [facility] Pager: [phone] 02/16/2019 1:38 PM

## 2019-02-16 NOTE — Progress Notes (Signed)
Ref: Dixie Dials, MD   Subjective:  Sleepy but awakens with tapping shoulder. Elevated WBC count and fever.  Extubated.   MRI is positive for lacunar infarct/ischemia in right corona radiata and chronic small vessel ischemia.  Objective:  Vital Signs in the last 24 hours: Temp:  [98.6 F (37 C)-102.4 F (39.1 C)] 102.4 F (39.1 C) (07/22 0800) Pulse Rate:  [82-148] 103 (07/22 0900) Cardiac Rhythm: Sinus tachycardia (07/22 0800) Resp:  [16-29] 27 (07/22 0900) BP: (95-179)/(50-143) 136/76 (07/22 0900) SpO2:  [93 %-100 %] 100 % (07/22 0900) Arterial Line BP: (130-190)/(65-109) 166/81 (07/22 0800) FiO2 (%):  [40 %] 40 % (07/22 0800) Weight:  [90.1 kg] 90.1 kg (07/22 0722)  Physical Exam: BP Readings from Last 1 Encounters:  02/16/19 136/76     Wt Readings from Last 1 Encounters:  02/16/19 90.1 kg    Weight change:  Body mass index is 35.19 kg/m. HEENT: [place]/AT, Eyes-Brown, Conjunctiva-Pink, Sclera-Non-icteric Neck: No JVD, No bruit, Trachea midline. Lungs:  Clear, Bilateral. Cardiac:  Mild tachycardia, Regular rhythm, normal S1 and S2, no S3. II/VI systolic murmur. Abdomen:  Soft, non-tender. BS present. Extremities:  No edema present. No cyanosis. No clubbing. CNS: AxOx1, Cranial nerves grossly intact, moves all 4 extremities.  Skin: Warm and dry.   Intake/Output from previous day: 07/21 0701 - 07/22 0700 In: 3562.6 [P.O.:720; I.V.:2025.9; IV Piggyback:816.7] Out: 8676 [Urine:1575]    Lab Results: BMET    Component Value Date/Time   NA 133 (L) 02/16/2019 0135   NA 132 (L) 02/15/2019 2127   NA 131 (L) 02/15/2019 0920   NA 139 11/30/2014 1001   K 3.1 (L) 02/16/2019 0135   K 3.5 02/15/2019 2127   K 3.6 02/15/2019 0920   CL 95 (L) 02/15/2019 2127   CL 97 (L) 02/15/2019 0920   CL 97 (L) 02/15/2019 0632   CO2 22 02/15/2019 2127   CO2 23 02/15/2019 0920   CO2 22 02/15/2019 0632   GLUCOSE 175 (H) 02/15/2019 2127   GLUCOSE 193 (H) 02/15/2019 0920   GLUCOSE 128  (H) 02/15/2019 0632   BUN 7 02/15/2019 2127   BUN 8 02/15/2019 0920   BUN 7 02/15/2019 0632   BUN 11 11/30/2014 1001   CREATININE 0.59 02/15/2019 2127   CREATININE 0.61 02/15/2019 0920   CREATININE 0.56 02/15/2019 0632   CALCIUM 8.9 02/15/2019 2127   CALCIUM 8.8 (L) 02/15/2019 0920   CALCIUM 8.7 (L) 02/15/2019 0632   GFRNONAA >60 02/15/2019 2127   GFRNONAA >60 02/15/2019 0920   GFRNONAA >60 02/15/2019 0632   GFRAA >60 02/15/2019 2127   GFRAA >60 02/15/2019 0920   GFRAA >60 02/15/2019 0632   CBC    Component Value Date/Time   WBC 12.9 (H) 02/15/2019 2127   RBC 4.80 02/15/2019 2127   HGB 13.3 02/16/2019 0135   HGB 12.5 11/30/2014 1001   HCT 39.0 02/16/2019 0135   HCT 38.6 11/30/2014 1001   PLT 470 (H) 02/15/2019 2127   PLT 369 11/30/2014 1001   MCV 86.0 02/15/2019 2127   MCV 83 11/30/2014 1001   MCH 26.5 02/15/2019 2127   MCHC 30.8 02/15/2019 2127   RDW 14.7 02/15/2019 2127   RDW 14.1 11/30/2014 1001   LYMPHSABS 1.4 02/15/2019 2127   LYMPHSABS 2.2 11/30/2014 1001   MONOABS 0.8 02/15/2019 2127   EOSABS 0.0 02/15/2019 2127   EOSABS 0.2 11/30/2014 1001   BASOSABS 0.1 02/15/2019 2127   BASOSABS 0.0 11/30/2014 1001   HEPATIC Function Panel Recent Labs  02/12/19 1857  PROT 8.1   HEMOGLOBIN A1C No components found for: HGA1C,  MPG CARDIAC ENZYMES Lab Results  Component Value Date   CKTOTAL 18 (L) 02/14/2019   CKMB 0.9 02/14/2019   BNP No results for input(s): PROBNP in the last 8760 hours. TSH Recent Labs    02/13/19 0629  TSH 0.367   CHOLESTEROL Recent Labs    02/16/19 0639  CHOL 167    Scheduled Meds: .  stroke: mapping our early stages of recovery book   Does not apply Once  . amLODipine  5 mg Oral Daily  . aspirin  300 mg Rectal Daily   Or  . aspirin  325 mg Oral Daily  . chlorhexidine gluconate (MEDLINE KIT)  15 mL Mouth Rinse BID  . Chlorhexidine Gluconate Cloth  6 each Topical Daily  . cyanocobalamin  1,000 mcg Intramuscular Once  .  enoxaparin (LOVENOX) injection  40 mg Subcutaneous QHS  . folic acid  2 mg Oral Daily  . folic acid  1 mg Intravenous Once  . insulin aspart  0-9 Units Subcutaneous TID WC  . irbesartan  75 mg Oral Daily  . levothyroxine  75 mcg Oral Q0600  . LORazepam      . mouth rinse  15 mL Mouth Rinse 10 times per day  . methotrexate  20 mg Oral Weekly  . metoprolol succinate  50 mg Oral Daily  . multivitamin with minerals  1 tablet Oral Daily  . nitroGLYCERIN      . ondansetron (ZOFRAN) IV  4 mg Intravenous TID AC  . pantoprazole (PROTONIX) IV  40 mg Intravenous Q24H  . potassium chloride  60 mEq Per Tube Once  . senna-docusate  1 tablet Oral Once   Continuous Infusions: . sodium chloride    . sodium chloride 75 mL/hr at 02/16/19 0900  . 0.9 % NaCl with KCl 20 mEq / L Stopped (02/15/19 2025)  . ceFAZolin    . niCARDipine Stopped (02/16/19 0209)   PRN Meds:.acetaminophen, Muscle Rub  Assessment/Plan: Recent lacunar infarct of right corona radiata Fever Leukocytosis Generalized weakness Neck pain Headache Type 2 DM Obesity Rheumatoid arthritis  Appreciate neurology consult. Awaiting EEG, possible LP, Echocardiogram   LOS: 4 days   Time spent including chart review, lab review, examination, discussion with patient and consultants : 79 min   Dixie Dials  MD  02/16/2019, 9:40 AM

## 2019-02-16 NOTE — Progress Notes (Signed)
[facility]   Reason for visit: Follow up on fatigue/weakness  Interval History: Patient had abrupt change in mental status yesterday and thought to have aphasia and CT angiogram suggested M2 lesion but diagnostic cerebral catheter angiogram was negative. MR shows tiny punctate right parietal lacunar infracts that are thought to likely be incidental. EEG performed due to concern for seizure activity but shows findings of diffuse encephalopathy possibly due to underlying metabolic etiology but no evidence for seizure activity. Patient intubated/sedated during angiogram due to agitation. Extubated this morning. On rocephin since admission.  WBC of 12.9 up from 6.5, febrile this morning to 102.4. UA negative, CXR negative, blood culture no growth at 4 days, COVID negative. LP performed this afternoon demonstrating markedly elevated opening pressure to 44 cm and closing pressure at 15 cms of CSF, fluid sent for cell count, protein, glucose, gram smear, bacterial culture, fungal smear, and TB smear and antigen.   Physical Exam: Constitutional: Appears in NAD, patient is sedate and occasionally speaks but quickly falls asleep - recently extubated Vitals:   02/16/19 1100 02/16/19 1200  BP: 133/70 (!) 148/57  Pulse: (!) 105 (!) 103  Resp: 18 (!) 23  Temp:    SpO2: 97% 100%  Respiratory: Normal respiratory effort; CTA B Cardiovascular: RRR GI: soft, nt, nd Skin: Approximately 0.5 cm lesion on right shoulder draining serosanguinous fluid and partially scabbed. Another slightly smaller lesion in inguinal region of similar appearance with less drainage. No purulence appreciated.  Review of Systems: Unable to be obtained due to AMS and language barrier  Lab Results  Component Value Date   WBC 12.9 (H) 02/15/2019   HGB 13.3 02/16/2019   HCT 39.0 02/16/2019   MCV 86.0 02/15/2019   PLT 470 (H) 02/15/2019    Lab Results  Component Value Date   CREATININE 0.59  02/15/2019   BUN 7 02/15/2019   NA 133 (L) 02/16/2019   K 3.1 (L) 02/16/2019   CL 95 (L) 02/15/2019   CO2 22 02/15/2019    Lab Results  Component Value Date   ALT 20 02/12/2019   AST 19 02/12/2019   ALKPHOS 87 02/12/2019     Microbiology: Recent Results (from the past 240 hour(s))  SARS Coronavirus 2 (CEPHEID - Performed in [facility] hospital lab), Hosp Order     Status: None   Collection Time: 02/12/19  6:28 PM   Specimen: Nasopharyngeal Swab  Result Value Ref Range Status   SARS Coronavirus 2 NEGATIVE NEGATIVE Final    Comment: (NOTE) If result is NEGATIVE SARS-CoV-2 target nucleic acids are NOT DETECTED. The SARS-CoV-2 RNA is generally detectable in upper and lower  respiratory specimens during the acute phase of infection. The lowest  concentration of SARS-CoV-2 viral copies this assay can detect is 250  copies / mL. A negative result does not preclude SARS-CoV-2 infection  and should not be used as the sole basis for treatment or other  patient management decisions.  A negative result may occur with  improper specimen collection / handling, submission of specimen other  than nasopharyngeal swab, presence of viral mutation(s) within the  areas targeted by this assay, and inadequate number of viral copies  (<250 copies / mL). A negative result must be combined with clinical  observations, patient history, and epidemiological information. If result is POSITIVE SARS-CoV-2 target nucleic acids are DETECTED. The SARS-CoV-2 RNA is generally detectable in upper and lower  respiratory specimens dur ing the acute phase of infection.  Positive  results are indicative of active infection with SARS-CoV-2.  Clinical  correlation with patient history and other diagnostic information is  necessary to determine patient infection status.  Positive results do  not rule out bacterial infection or co-infection with other viruses. If result is PRESUMPTIVE POSTIVE SARS-CoV-2 nucleic  acids MAY BE PRESENT.   A presumptive positive result was obtained on the submitted specimen  and confirmed on repeat testing.  While 2019 novel coronavirus  (SARS-CoV-2) nucleic acids may be present in the submitted sample  additional confirmatory testing may be necessary for epidemiological  and / or clinical management purposes  to differentiate between  SARS-CoV-2 and other Sarbecovirus currently known to infect humans.  If clinically indicated additional testing with an alternate test  methodology [phone]) is advised. The SARS-CoV-2 RNA is generally  detectable in upper and lower respiratory sp ecimens during the acute  phase of infection. The expected result is Negative. Fact Sheet for Patients:  StrictlyIdeas.no Fact Sheet for Healthcare Providers: BankingDealers.co.za This test is not yet approved or cleared by the [place] FDA and has been authorized for detection and/or diagnosis of SARS-CoV-2 by FDA under an Emergency Use Authorization (EUA).  This EUA will remain in effect (meaning this test can be used) for the duration of the COVID-19 declaration under Section 564(b)(1) of the Act, 21 U.S.C. section 360bbb-3(b)(1), unless the authorization is terminated or revoked sooner. Performed at [facility], [place] [address], [place], [place] [address]   Culture, blood (routine x 2)     Status: None (Preliminary result)   Collection Time: 02/12/19  6:57 PM   Specimen: BLOOD LEFT HAND  Result Value Ref Range Status   Specimen Description   Final    BLOOD LEFT HAND Performed at [facility] [address], [place], [place] [address]    Special Requests   Final    BOTTLES DRAWN AEROBIC AND ANAEROBIC Blood Culture results may not be optimal due to an excessive volume of blood received in culture bottles Performed at [facility] [address], [place], [place] [address]     Culture   Final    NO GROWTH 4 DAYS Performed at [facility], [place] [address] [place], [place] [address]    Report Status PENDING  Incomplete  Culture, blood (routine x 2)     Status: None (Preliminary result)   Collection Time: 02/12/19  6:57 PM   Specimen: BLOOD RIGHT HAND  Result Value Ref Range Status   Specimen Description   Final    BLOOD RIGHT HAND Performed at [facility] [address], [place], [place] [address]    Special Requests   Final    BOTTLES DRAWN AEROBIC AND ANAEROBIC Blood Culture adequate volume Performed at [facility] [address] [place], [place] [address]    Culture   Final    NO GROWTH 4 DAYS Performed at [facility], [place] [address] [place], [place] [address]    Report Status PENDING  Incomplete  Urine culture     Status: None   Collection Time: 02/13/19  1:31 AM   Specimen: Urine, Random  Result Value Ref Range Status   Specimen Description   Final    URINE, RANDOM Performed at [facility] [address] [place],  [address]    Special Requests   Final    NONE Performed at [facility], [place]  [address] [place], [place] [address]    Culture   Final    NO GROWTH Performed at [facility], [address] [place], [place] [address]    Report Status 02/14/2019 FINAL  Final  MRSA PCR Screening     Status: None   Collection Time: 02/16/19 12:40 AM   Specimen: Nasal Mucosa; Nasopharyngeal  Result Value Ref Range Status   MRSA by PCR NEGATIVE NEGATIVE Final    Comment:        The GeneXpert MRSA Assay (FDA approved for NASAL specimens only), is one component of a comprehensive MRSA colonization surveillance program. It is not intended to diagnose MRSA infection nor to guide or monitor treatment for MRSA infections. Performed at [facility], [address] [place], [place] [address]     Impression: Victoria Cox is a 58 yo  female with PMH of seronegative RA on chronic immunosuppressive therapy with Humira, methotrexate, and recent steroids therapy who presented with weakness/failure to thrive and had febrile episode to 102.4. Multiple infectious etiologies ruled out with blood cultures, UA, CXR unrevealing. Considering meningitis vs. Rheumatic disease flare vs. medication toxicity vs. adrenal insufficiency 2/2 prolonged steroid usage. Elevated opening pressure on LP may support suspicion for meningitis. Plan: * Followup on LP fluid analysis * Continue with ceftriaxone for now * Hold methotrexate and humira until completion of further workup

## 2019-02-16 NOTE — Procedures (Signed)
Patient Name: Victoria Cox  MRN: [id number]  Epilepsy Attending: Lora Havens  Referring Physician/Provider: Dr Roland Rack, MD Date: 02/16/19  Duration: 26.20 mins  Patient history: 58 y.o. female with a history of rheumatoid arthritis who presents with a several week history of myalgias, headache, mouth sores, generalized weakness. She had an episode of worsening confusion and  Speech disturbance on 02/15/2019. EEG to rule out seizures.  Level of alertness: Confused  AEDs during EEG study: None  Technical aspects: This EEG study was done with scalp electrodes positioned according to the 10-20 International system of electrode placement. Electrical activity reviewed with a high frequency filter of 70Hz  and a low frequency filter of 1Hz . EEG data were recorded continuously and digitally stored.   BACKGROUND ACTIVITY: Posterior dominant rhythm: The posterior dominant rhythm consists of 8-9 Hz activity of moderate voltage (25-35 uV) seen predominantly in posterior head regions, symmetric and reactive to eye opening and eye closing.          Slowing: There is intermittent rhythmic slowing with triphasic morphology, maximal bifrontal.   EPILEPTIFORM ACTIVITY: Interictal epileptiform activity: None  Ictal Activity: None  OTHER EVENTS: One episode of right eye twitching was marked bedside EEG tech at 1022 which was not visible on camera and was not associated with EEG change before, during and after the event.  ACTIVATION PROCEDURES:  Hyperventilation and photic stimulation were not performed.  IMPRESSION: This study showed evidence of mild diffuse encephalopath possibly due to underyling metabolic etiology. No seizures or epileptiform discharges were seen throughout the recording.   Victoria Cox

## 2019-02-16 NOTE — Transfer of Care (Signed)
Immediate Anesthesia Transfer of Care Note  Patient: Victoria Cox  Procedure(s) Performed: IR WITH ANESTHESIA (N/A )  Patient Location: PACU  Anesthesia Type:General  Level of Consciousness: sedated and Patient remains intubated per anesthesia plan  Airway & Oxygen Therapy: Patient remains intubated per anesthesia plan and Patient placed on Ventilator (see vital sign flow sheet for setting)  Post-op Assessment: Report given to RN and Post -op Vital signs reviewed and stable  Post vital signs: Reviewed and stable  Last Vitals:  Vitals Value Taken Time  BP 175/96 02/16/19 0045  Temp    Pulse 120 02/16/19 0047  Resp 18 02/16/19 0047  SpO2 99 % 02/16/19 0047  Vitals shown include unvalidated device data.  Last Pain:  Vitals:   02/16/19 0030  TempSrc: Axillary  PainSc:       Patients Stated Pain Goal: 0 (63/14/97 0263)  Complications: No apparent anesthesia complications

## 2019-02-16 NOTE — Sedation Documentation (Signed)
R groin and pulses assessed with Merrilee Seashore, RN at bedside. See flowsheet.

## 2019-02-16 NOTE — Progress Notes (Signed)
SLP Cancellation Note  Patient Details Name: Victoria Cox MRN: [id number] DOB: Jul 17, 1961   Cancelled treatment:       Reason Eval/Treat Not Completed: Patient not medically ready   [name], Katherene Ponto 02/16/2019, 7:35 AM

## 2019-02-16 NOTE — Evaluation (Signed)
Physical Therapy Evaluation Patient Details Name: Victoria Cox MRN: [id number] DOB: [birth year] Today's Date: 02/16/2019   History of Present Illness  58 y.o.femalewith a history of rheumatoid arthritis who presents with a several week history of myalgias, headache, mouth sores, generalized weakness. She had an episode of worsening confusion and Speech disturbance on 02/15/2019. 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 EEG shows diffuse slowing and triphasics. Pt also now s/p spinal tap on 7/22.   Clinical Impression  Pt admitted with above diagnosis. Pt currently with functional limitations due to the deficits listed below (see PT Problem List). Prior to admission, pt lives with her family and is independent with ADL's and mobility. PT/OT evaluation limited due to pt fatigue and agitation with mobility attempts. Requiring two person assist for bed mobility, quickly lying back down after short period on edge of bed. Displays decreased cognition, left sided weakness and questionable left sided inattention. Decreased responsiveness to simple commands and to family (via tele monitor). Will plan to have family member present for assessment tomorrow. Given PLOF and excellent family support, will benefit from CIR to maximize functional independence.      Follow Up Recommendations CIR;Supervision/Assistance - 24 hour    Equipment Recommendations  Other (comment)(TBA)    Recommendations for Other Services Rehab consult     Precautions / Restrictions Precautions Precautions: Fall Restrictions Weight Bearing Restrictions: No      Mobility  Bed Mobility Overal bed mobility: Needs Assistance Bed Mobility: Supine to Sit;Sit to Supine     Supine to sit: Total assist;+2 for physical assistance;+2 for safety/equipment Sit to supine: Min assist;+2 for safety/equipment   General bed mobility comments: pt required +2 assist to transition to EOB given decreased level of arousal; pt becoming  increasingly agitated with mobility attempts and initiated returning to supine with assist provided to ensure safety with transition  Transfers                 General transfer comment: unable today  Ambulation/Gait                Stairs            Wheelchair Mobility    Modified Rankin (Stroke Patients Only) Modified Rankin (Stroke Patients Only) Pre-Morbid Rankin Score: No symptoms Modified Rankin: Severe disability     Balance Overall balance assessment: Needs assistance Sitting-balance support: Feet supported Sitting balance-Leahy Scale: Poor Sitting balance - Comments: able to maintain intermittently with minguard assist, often with anterior/posterior lean due to attempts to return to supine Postural control: Posterior lean                                   Pertinent Vitals/Pain Pain Assessment: Faces Faces Pain Scale: Hurts little more Pain Location: generalized  Pain Descriptors / Indicators: Discomfort;Grimacing Pain Intervention(s): Monitored during session    Home Living Family/patient expects to be discharged to:: Private residence Living Arrangements: Spouse/significant other;Children Available Help at Discharge: Family Type of Home: House       Home Layout: One level        Prior Function Level of Independence: Independent         Comments: was performing iADL tasks including cooking, cleaning     Hand Dominance        Extremity/Trunk Assessment   Upper Extremity Assessment Upper Extremity Assessment: LUE deficits/detail LUE Deficits / Details: difficult to fully assess due to cognition/arousal level however  noted LUE weakness and decreased initiation to use UE; pt does react to painful stimuli; edemous hand/forearm LUE Coordination: decreased fine motor;decreased gross motor    Lower Extremity Assessment Lower Extremity Assessment: Overall WFL for tasks assessed;Difficult to assess due to impaired  cognition(moving functionally)       Communication   Communication: Prefers language other than English(family assisting to interpret this session via video chat )  Cognition Arousal/Alertness: Lethargic Behavior During Therapy: Agitated;Flat affect Overall Cognitive Status: Difficult to assess                                 General Comments: pt lethargic this session and requires cues to remain awake; family present via tele monitor to assist with interpreting; pt  only minimally following simple commands (<25%) and only intermittently responding to family's questions/requests;  intermittently agitated with therapist's attempts to mobilize       General Comments      Exercises     Assessment/Plan    PT Assessment Patient needs continued PT services  PT Problem List Decreased strength;Decreased activity tolerance;Decreased mobility;Decreased balance;Decreased cognition;Decreased safety awareness       PT Treatment Interventions DME instruction;Gait training;Functional mobility training;Therapeutic activities;Therapeutic exercise;Balance training;Patient/family education    PT Goals (Current goals can be found in the Care Plan section)  Acute Rehab PT Goals Patient Stated Goal: none stated PT Goal Formulation: Patient unable to participate in goal setting Time For Goal Achievement: 03/02/19 Potential to Achieve Goals: Good    Frequency Min 4X/week   Barriers to discharge        Co-evaluation PT/OT/SLP Co-Evaluation/Treatment: Yes Reason for Co-Treatment: Complexity of the patient's impairments (multi-system involvement);Necessary to address cognition/behavior during functional activity;For patient/therapist safety;To address functional/ADL transfers PT goals addressed during session: Mobility/safety with mobility OT goals addressed during session: Strengthening/ROM       AM-PAC PT "6 Clicks" Mobility  Outcome Measure Help needed turning from your back  to your side while in a flat bed without using bedrails?: Total Help needed moving from lying on your back to sitting on the side of a flat bed without using bedrails?: Total Help needed moving to and from a bed to a chair (including a wheelchair)?: Total Help needed standing up from a chair using your arms (e.g., wheelchair or bedside chair)?: Total Help needed to walk in hospital room?: Total Help needed climbing 3-5 steps with a railing? : Total 6 Click Score: 6    End of Session   Activity Tolerance: Treatment limited secondary to agitation;Patient limited by fatigue Patient left: in bed;with call bell/phone within reach Nurse Communication: Mobility status PT Visit Diagnosis: Other abnormalities of gait and mobility (R26.89)    Time: 1478-2956 PT Time Calculation (min) (ACUTE ONLY): 29 min   Charges:   PT Evaluation $PT Eval Moderate Complexity: 1 Mod          Ellamae Sia, PT, DPT Acute Rehabilitation Services Pager [phone] Office [phone]   Willy Eddy 02/16/2019, 5:29 PM

## 2019-02-16 NOTE — Evaluation (Signed)
Occupational Therapy Evaluation Patient Details Name: Victoria Cox MRN: [id number] DOB: [birth year] Today's Date: 02/16/2019    History of Present Illness 58 y.o.femalewith a history of rheumatoid arthritis who presents with a several week history of myalgias, headache, mouth sores, generalized weakness. She had an episode of worsening confusion and Speech disturbance on 02/15/2019. 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 EEG shows diffuse slowing and triphasics. Pt also now s/p spinal tap on 7/22.    Clinical Impression   This 58 y/o female presents with the above. Limited eval this session as pt becoming agitated with mobility attempts. Pt requiring two person assist to transition to sitting EOB, though pt only maintaining for brief period of time (<5 min) before initiating return to supine on her own. Pt observed to have notable LUE weakness and questionable L inattention this session, only minimally following simple commands and responding to family's questions/requests (family interpreting via tele monitor). Per family pt was previously independent with ADL, iADL and functional mobility; family appear very supportive. She will benefit from continued acute OT services and pending pt progress suspect she will progress well with continued post acute rehab services in CIR setting to maximize her safety and independence with ADL and mobility. Will follow.     Follow Up Recommendations  CIR;Supervision/Assistance - 24 hour(pending pt progress)    Equipment Recommendations  Other (comment)(TBD)    Recommendations for Other Services Rehab consult     Precautions / Restrictions Precautions Precautions: Fall Restrictions Weight Bearing Restrictions: No      Mobility Bed Mobility Overal bed mobility: Needs Assistance Bed Mobility: Supine to Sit;Sit to Supine     Supine to sit: Total assist;+2 for physical assistance;+2 for safety/equipment Sit to supine: Min assist;+2 for  safety/equipment   General bed mobility comments: pt required +2 assist to transition to EOB given decreased level of arousal; pt becoming increasingly agitated with mobility attempts and initiated returning to supine with assist provided to ensure safety with transition  Transfers                 General transfer comment: unable today    Balance Overall balance assessment: Needs assistance Sitting-balance support: Feet supported Sitting balance-Leahy Scale: Poor Sitting balance - Comments: able to maintain intermittently with minguard assist, often with anterior/posterior lean due to attempts to return to supine Postural control: Posterior lean                                 ADL either performed or assessed with clinical judgement   ADL Overall ADL's : Needs assistance/impaired                                     Functional mobility during ADLs: Maximal assistance;Total assistance;+2 for physical assistance;+2 for safety/equipment(bed mobiltiy) General ADL Comments: pt overall requires totalA for ADL at this time given decreased initiation to participate in mobility/ADL tasks      Vision   Vision Assessment?: Vision impaired- to be further tested in functional context Additional Comments: suspect L inattention, pt is able to look towards therapist standing to her left when cued; difficult to fully assess given cognitive impairments and language barrier; will continue to assess      Perception     Praxis      Pertinent Vitals/Pain Pain Assessment: Faces Faces Pain Scale: Hurts little  more Pain Location: generalized  Pain Descriptors / Indicators: Discomfort;Grimacing Pain Intervention(s): Monitored during session;Repositioned     Hand Dominance     Extremity/Trunk Assessment Upper Extremity Assessment Upper Extremity Assessment: LUE deficits/detail;Difficult to assess due to impaired cognition LUE Deficits / Details: difficult to  fully assess due to cognition/arousal level however noted LUE weakness and decreased initiation to use UE; pt does react to painful stimuli; edemous hand/forearm LUE Coordination: decreased fine motor;decreased gross motor   Lower Extremity Assessment Lower Extremity Assessment: Defer to PT evaluation       Communication Communication Communication: Prefers language other than English(family assisting to interpret this session)   Cognition Arousal/Alertness: Lethargic Behavior During Therapy: Agitated;Flat affect Overall Cognitive Status: Difficult to assess                                 General Comments: pt lethargic this session and requires cues to remain awake; family present via tele monitor to assist with interpreting; pt  only minimally following simple commands (<25%) and only intermittently responding to family's questions/requests;  intermittently agitated with therapist's attempts to mobilize    General Comments       Exercises     Shoulder Instructions      Home Living Family/patient expects to be discharged to:: Private residence Living Arrangements: Spouse/significant other;Children Available Help at Discharge: Family Type of Home: House       Home Layout: One level     Bathroom Shower/Tub: Chief Strategy Officer: Standard                Prior Functioning/Environment Level of Independence: Independent        Comments: was performing iADL tasks including cooking, cleaning        OT Problem List: Decreased strength;Decreased range of motion;Decreased activity tolerance;Impaired balance (sitting and/or standing);Decreased coordination;Decreased cognition;Decreased safety awareness;Decreased knowledge of use of DME or AE;Impaired UE functional use;Impaired sensation      OT Treatment/Interventions: Self-care/ADL training;Therapeutic exercise;Neuromuscular education;DME and/or AE instruction;Energy conservation;Therapeutic  activities;Cognitive remediation/compensation;Patient/family education;Balance training    OT Goals(Current goals can be found in the care plan section) Acute Rehab OT Goals Patient Stated Goal: none stated OT Goal Formulation: Patient unable to participate in goal setting Time For Goal Achievement: 03/02/19 Potential to Achieve Goals: Good  OT Frequency: Min 2X/week   Barriers to D/C:            Co-evaluation PT/OT/SLP Co-Evaluation/Treatment: Yes Reason for Co-Treatment: Complexity of the patient's impairments (multi-system involvement);For patient/therapist safety;To address functional/ADL transfers   OT goals addressed during session: Strengthening/ROM      AM-PAC OT "6 Clicks" Daily Activity     Outcome Measure Help from another person eating meals?: Total Help from another person taking care of personal grooming?: Total Help from another person toileting, which includes using toliet, bedpan, or urinal?: Total Help from another person bathing (including washing, rinsing, drying)?: Total Help from another person to put on and taking off regular upper body clothing?: Total Help from another person to put on and taking off regular lower body clothing?: Total 6 Click Score: 6   End of Session Nurse Communication: Mobility status  Activity Tolerance: Treatment limited secondary to agitation;Patient limited by lethargy Patient left: in bed;with call bell/phone within reach;with bed alarm set;with nursing/sitter in room(NT present)  OT Visit Diagnosis: Other symptoms and signs involving cognitive function;Other symptoms and signs involving the nervous system (R29.898)  Time: 0160-1093 OT Time Calculation (min): 27 min Charges:  OT General Charges $OT Visit: 1 Visit OT Evaluation $OT Eval Moderate Complexity: 1 Mod  Marcy Siren, OT [employer] Pager [phone] Office [phone]  Orlando Penner 02/16/2019, 4:53 PM

## 2019-02-16 NOTE — Progress Notes (Signed)
  Echocardiogram 2D Echocardiogram was attempted but patient was having an EEG.   [name] 02/16/2019, 10:03 AM

## 2019-02-16 NOTE — Progress Notes (Signed)
EEG complete - results pending 

## 2019-02-16 NOTE — Sedation Documentation (Signed)
SBAR called to NIck, Therapist, sports.

## 2019-02-16 NOTE — Consult Note (Signed)
PULMONARY / CRITICAL CARE MEDICINE   NAME:  Victoria Cox, MRN:  [id number], DOB:  1960-11-21, LOS: 4 ADMISSION DATE:  02/12/2019, CONSULTATION DATE: 02/16/2019 REFERRING MD: Cardiology, CHIEF COMPLAINT: Altered mental status  BRIEF HISTORY:    58 year old lady with altered mental status intubated postprocedure   HISTORY OF PRESENT ILLNESS   Victoria Cox is a 58 year old lady with history of rheumatoid arthritis and diabetes mellitus type 2 who was admitted with generalized body aches and poor appetite under cardiology.  Over the last day or so he became very confused and agitated and there was concern for acute stroke patient was taken to interventional radiology under angiogram showed no occlusion.  Patient came back from there intubated due to agitation during procedure.  MRI was ordered and is pending.  Obviously not able to get any history from the patient due to patient being intubated and sedated.   SIGNIFICANT PAST MEDICAL HISTORY   Rheumatoid arthritis Type 2 diabetes mellitus  SIGNIFICANT EVENTS:   STUDIES:   CT headSuspicion of left M2 superior segment occlusion.  Cerebral angiogram 7/22 four-vessel arteriogram showed no occlusion  MRI brain 7/22/20201. Small focus of acute ischemia within the right corona radiata without hemorrhage or mass effect. 2. Mild white matter changes of chronic small vessel ischemia.   CULTURES:    ANTIBIOTICS:    LINES/TUBES:   ET tube CONSULTANTS:  Cardiology Neurology Interventional radiology SUBJECTIVE:    CONSTITUTIONAL: BP (!) 175/96   Pulse (!) 113   Temp 99.3 F (37.4 C) (Axillary)   Resp 18   Ht 5\' 3"  (1.6 m)   Wt 84.6 kg   SpO2 98%   BMI 33.04 kg/m   I/O last 3 completed shifts: In: 2372 [P.O.:1551; I.V.:821] Out: 200 [Urine:200]     Vent Mode: PRVC FiO2 (%):  [40 %] 40 % Set Rate:  [18 bmp] 18 bmp Vt Set:  [420 mL] 420 mL PEEP:  [5 cmH20] 5 cmH20 Plateau Pressure:  [18 cmH20] 18 cmH20  PHYSICAL  EXAM: General: Acutely ill on mechanical ventilation Neuro: Sedated not following commands bilateral downward gaze HEENT: Orally intubated Cardiovascular: Normal heart sound no added sounds or murmurs Lungs: Equal air sounds bilaterally no crackles no wheezing Abdomen: Soft no tenderness no guarding Musculoskeletal: No lower edema Skin: No rash  RESOLVED PROBLEM LIST   ASSESSMENT AND PLAN   Assessment: -Acute respiratory insufficiency require mechanical ventilation -Acute metabolic encephalopathy -To rule out acute CVA -Hyponatremia -Hypokalemia  Plan: -Adjust mechanical ventilation use lung protective strategy keep pulse ox above 92% -MRI was ordered stat -Angios did not show any occlusion no thrombectomy was done -Follow sodium level -Wean down sedation and reassess her mental status -Appreciate neurology input -Replace potassium -consider lumbar puncture if agreed by neurology -EEG  SUMMARY OF TODAY'S PLAN:    Best Practice / Goals of Care / Disposition.   DVT PROPHYLAXIS: SCD SUP: PPI NUTRITION: N.p.o. MOBILITY: Bedrest GOALS OF CARE: Full code FAMILY DISCUSSIONS: No family around DISPOSITION ICU admission  LABS  Glucose Recent Labs  Lab 02/14/19 1648 02/14/19 2150 02/15/19 0809 02/15/19 1127 02/15/19 1622 02/15/19 2106  GLUCAP 145* 200* 133* 196* 129* 162*    BMET Recent Labs  Lab 02/15/19 02/17/19 02/15/19 0920 02/15/19 2127 02/16/19 0135  NA 130* 131* 132* 133*  K 3.7 3.6 3.5 3.1*  CL 97* 97* 95*  --   CO2 22 23 22   --   BUN 7 8 7   --   CREATININE 0.56 0.61 0.59  --  GLUCOSE 128* 193* 175*  --     Liver Enzymes Recent Labs  Lab 02/12/19 1857  AST 19  ALT 20  ALKPHOS 87  BILITOT 0.4  ALBUMIN 3.5    Electrolytes Recent Labs  Lab 02/15/19 0632 02/15/19 0920 02/15/19 2127  CALCIUM 8.7* 8.8* 8.9    CBC Recent Labs  Lab 02/13/19 0629 02/14/19 0903 02/15/19 2127 02/16/19 0135  WBC 7.0 6.5 12.9*  --   HGB 11.9* 11.7* 12.7  13.3  HCT 38.7 37.2 41.3 39.0  PLT 360 398 470*  --     ABG Recent Labs  Lab 02/16/19 0135  PHART 7.427  PCO2ART 31.7*  PO2ART 98.0    Coag's No results for input(s): APTT, INR in the last 168 hours.  Sepsis Markers Recent Labs  Lab 02/12/19 1857  LATICACIDVEN 1.3    Cardiac Enzymes No results for input(s): TROPONINI, PROBNP in the last 168 hours.  PAST MEDICAL HISTORY :   She  has a past medical history of Arthritis, Diabetes (HCC), and High blood pressure.  PAST SURGICAL HISTORY:  She  has no past surgical history on file.  No Known Allergies  No current facility-administered medications on file prior to encounter.    Current Outpatient Medications on File Prior to Encounter  Medication Sig  . acetaminophen (TYLENOL) 500 MG tablet Take 500 mg by mouth as needed for moderate pain or headache.   Marland Kitchen amLODipine (NORVASC) 5 MG tablet Take 5 mg by mouth every evening.   . famotidine (PEPCID) 20 MG tablet Take 20 mg by mouth 2 (two) times daily.  . folic acid (FOLVITE) 1 MG tablet Take 2 mg by mouth daily.   Marland Kitchen glucose blood (FREESTYLE LITE) test strip Use to test blood sugar twice daily Dx: E11.9  . HUMIRA PEN 40 MG/0.4ML PNKT Inject 40 mg into the muscle every 14 (fourteen) days.  Marland Kitchen ibuprofen (ADVIL,MOTRIN) 200 MG tablet Take 200 mg by mouth every 6 (six) hours as needed for headache or moderate pain.   . Lancets (FREESTYLE) lancets Use to test blood sugar twice daily. Dx: E11.9  . levothyroxine (SYNTHROID) 75 MCG tablet Take 75 mcg by mouth daily.  . meclizine (ANTIVERT) 25 MG tablet Take 25 mg by mouth 3 (three) times daily as needed for dizziness.  . metFORMIN (GLUCOPHAGE) 1000 MG tablet Take one tablet by mouth twice daily with meals to control blood sugar (Patient taking differently: Take 1,000 mg by mouth 2 (two) times daily with a meal. )  . methotrexate 2.5 MG tablet Take 10 tablets by mouth every 7 (seven) days.  . metoprolol succinate (TOPROL-XL) 50 MG 24 hr  tablet Take 50 mg by mouth daily.  . Multiple Vitamin (MULTIVITAMIN WITH MINERALS) TABS tablet Take 1 tablet by mouth daily.  Marland Kitchen NOVOLOG MIX 70/30 (70-30) 100 UNIT/ML injection Inject 18 Units into the skin 2 (two) times a day.  Marland Kitchen omeprazole (PRILOSEC) 20 MG capsule Take 20 mg by mouth 2 (two) times daily before a meal.  . telmisartan (MICARDIS) 20 MG tablet Take 20 mg by mouth daily.    FAMILY HISTORY:   Her family history includes Hypertension in her mother. Could not be obtained due to patient altered mental status   SOCIAL HISTORY:  She  reports that she has never smoked. She has never used smokeless tobacco. She reports that she does not drink alcohol or use drugs. Could not be obtained due to patient altered mental status   REVIEW OF SYSTEMS:  Could not be obtained due to patient altered mental status

## 2019-02-16 NOTE — Progress Notes (Signed)
Pharmacy Antibiotic Note  Victoria Cox is a 58 y.o. 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  Pharmacy has been consulted for acyclovir dosing. WBC elevated to 12.9, Scr wnl (0.59), febrile (with max temp 102.4). LP collected, results pending.   Plan: Start Acyclovir 675 mg IV q8h - Dosing based on 10 mg/kg (AdjBW)   Monitor renal function, clinical progress, LP cultures   Height: 5\' 3"  (160 cm) Weight: 198 lb 10.2 oz (90.1 kg) IBW/kg (Calculated) : 52.4  Temp (24hrs), Avg:99.5 F (37.5 C), Min:98.2 F (36.8 C), Max:102.4 F (39.1 C)  Recent Labs  Lab 02/12/19 1857 02/13/19 0629 02/14/19 0903 02/15/19 0632 02/15/19 0920 02/15/19 2127  WBC 8.9 7.0 6.5  --   --  12.9*  CREATININE 0.67 0.49  --  0.56 0.61 0.59  LATICACIDVEN 1.3  --   --   --   --   --     Estimated Creatinine Clearance: 80.7 mL/min (by C-G formula based on SCr of 0.59 mg/dL).    No Known Allergies  Antimicrobials this admission: 7/22 Acyclovir >>  7/18 Ceftriaxone >> 7/22  Microbiology results: 7/18 BCx: NGTD x4 7/19 UCx: NG 7/22 CSF: 7/22 MRSA PCR: Neg  Thank you for allowing pharmacy to be a part of this patient's care.  Gillian Scarce 02/16/2019 5:40 PM

## 2019-02-16 NOTE — Progress Notes (Signed)
  Echocardiogram 2D Echocardiogram has been performed.  [name] 02/16/2019, 3:02 PM

## 2019-02-16 NOTE — Progress Notes (Signed)
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 Patient awake and alert laying in bed undergoing EEG. Can spontaneously move all extremities with some weakness of LUE. Right groin incision c/d/i.   Allergies: Patient has no known allergies.  Medications: Prior to Admission medications   Medication Sig Start Date End Date Taking? Authorizing Provider  acetaminophen (TYLENOL) 500 MG tablet Take 500 mg by mouth as needed for moderate pain or headache.    Yes [provider]  amLODipine (NORVASC) 5 MG tablet Take 5 mg by mouth every evening.  12/31/18  Yes [provider]  famotidine (PEPCID) 20 MG tablet Take 20 mg by mouth 2 (two) times daily.   Yes [provider]  folic acid (FOLVITE) 1 MG tablet Take 2 mg by mouth daily.  02/02/19  Yes [provider]  glucose blood (FREESTYLE LITE) test strip Use to test blood sugar twice daily Dx: E11.9 12/08/14  Yes Eubanks, Janene Harvey, NP  HUMIRA PEN 40 MG/0.4ML PNKT Inject 40 mg into the muscle every 14 (fourteen) days. 12/09/18  Yes [provider]  ibuprofen (ADVIL,MOTRIN) 200 MG tablet Take 200 mg by mouth every 6 (six) hours as needed for headache or moderate pain.    Yes [provider]  Lancets (FREESTYLE) lancets Use to test blood sugar twice daily. Dx: E11.9 12/08/14  Yes Sharon Seller, NP  levothyroxine (SYNTHROID) 75 MCG tablet Take 75 mcg by mouth daily. 12/31/18  Yes [provider]  meclizine (ANTIVERT) 25 MG tablet Take 25 mg by mouth 3 (three) times daily as needed for dizziness.   Yes [provider]  metFORMIN (GLUCOPHAGE) 1000 MG tablet Take one tablet by mouth twice daily with meals to control blood sugar Patient taking differently: Take 1,000 mg by mouth 2 (two) times daily with a meal.  01/09/15  Yes Sharon Seller, NP  methotrexate 2.5 MG tablet Take 10 tablets by mouth every 7 (seven) days. 12/08/18  Yes [provider]  metoprolol succinate (TOPROL-XL) 50 MG 24 hr tablet Take 50 mg by mouth daily. 01/24/19  Yes [provider]  Multiple Vitamin (MULTIVITAMIN WITH MINERALS) TABS tablet Take 1 tablet by mouth daily.   Yes [provider]  NOVOLOG MIX 70/30 (70-30) 100 UNIT/ML injection Inject 18 Units into the skin 2 (two) times a day. 01/24/19  Yes [provider]  omeprazole (PRILOSEC) 20 MG capsule Take 20 mg by mouth 2 (two) times daily before a meal.   Yes [provider]  telmisartan (MICARDIS) 20 MG tablet Take 20 mg by mouth daily.   Yes [provider]     Vital Signs: BP 93/65    Pulse (!) 102    Temp (!) 102.4 F (39.1 C) (Axillary)    Resp (!) 27    Ht  (1.6 m)    Wt 198 lb 10.2 oz (90.1 kg)    SpO2 99%    BMI 35.19 kg/m   Physical Exam Vitals signs and nursing note reviewed.  Constitutional:      General: She is not in acute distress.    Appearance: Normal appearance.  Pulmonary:     Effort: Pulmonary effort is normal. No  respiratory distress.  Skin:    General: Skin is warm and dry.     Comments: Right groin incision soft with mild tenderness, no active bleeding or hematoma.  Neurological:     Mental Status: She is alert.     Comments: Alert, awake, and oriented x3. Can spontaneously move all extremities with some weakness of LUE. Distal pulses palpable bilaterally per RN.     Imaging: Ct Angio Head W Or Wo Contrast  Result Date: 02/15/2019 CLINICAL DATA:  Confusion.  Altered mental status. EXAM: CT ANGIOGRAPHY HEAD AND NECK TECHNIQUE: Multidetector CT imaging of the head and neck  was performed using the standard protocol during bolus administration of intravenous contrast. Multiplanar CT image reconstructions and MIPs were obtained to evaluate the vascular anatomy. Carotid stenosis measurements (when applicable) are obtained utilizing NASCET criteria, using the distal internal carotid diameter as the denominator. CONTRAST:  OMNIPAQUE IOHEXOL 350 MG/ML SOLN COMPARISON:  Head CT earlier same day FINDINGS: CTA NECK FINDINGS Aortic arch: Aortic atherosclerosis.  No aneurysm or dissection. Right carotid system: Common carotid artery widely patent to the bifurcation. Mild soft plaque at the carotid bifurcation but no stenosis. Cervical ICA is tortuous but widely patent. Left carotid system: Common carotid artery widely patent to the bifurcation. Carotid bifurcation is normal. Cervical ICA is tortuous but widely patent. Vertebral arteries: Both vertebral artery origins are widely patent. Both vertebral arteries appear normal through the cervical region to the foramen magnum. Skeleton: Ordinary cervical spondylosis. Other neck: No mass or lymphadenopathy. Upper chest: Negative Review of the MIP images confirms the above findings CTA HEAD FINDINGS Anterior circulation: Both internal carotid arteries are widely patent through the skull base and siphon regions. No siphon stenosis. Right anterior and middle cerebral vessels appear widely patent and normal. On the left, the A1 segment is diminutive. This is presumed be congenital but it could be due to acquired disease. The left M1 vessel is widely patent. I suspicious for a left MCA branch vessel occlusion, though this is difficult to specifically identify. There appear subjectively to be less opacified vessels in the MCA territory. M2 superior segment branch occlusion is suspected. Posterior circulation: Both vertebral arteries are widely patent to the basilar. Basilar artery is small but patent. Posterior circulation branch vessels are patent.  The left PCA arises from the anterior circulation. Venous sinuses: Patent and normal. Anatomic variants: None significant. Delayed phase: Not performed. Review of the MIP images confirms the above findings IMPRESSION: Suspicion of left M2 superior segment occlusion. These results were communicated to Dr. Amada Jupiter at 10:04 pmon 7/21/2020by text page via the [facility] messaging system. Electronically Signed   By: Paulina Fusi M.D.   On: 02/15/2019 22:05   Dg Chest 2 View  Result Date: 02/12/2019 CLINICAL DATA:  Neck pain extending down the back. Dizziness. Combative and uncooperative during the examination. EXAM: CHEST - 2 VIEW COMPARISON:  07/07/2011. FINDINGS: Very poor inspiration. Grossly stable borderline enlarged cardiac silhouette. Grossly clear lungs with normal vascularity. Mild thoracic spine degenerative changes. IMPRESSION: No acute abnormality. Electronically Signed   By: Beckie Salts M.D.   On: 02/12/2019 20:09   Ct Head Wo Contrast  Result Date: 02/15/2019 CLINICAL DATA:  Encephalopathy.  Last no normal 16:30 EXAM: CT HEAD WITHOUT CONTRAST TECHNIQUE: Contiguous axial images were obtained from the base of the skull through the vertex without intravenous contrast. COMPARISON:  None. FINDINGS: Brain: There is no mass, hemorrhage or extra-axial collection. The size and configuration of the ventricles and extra-axial  CSF spaces are normal. The brain parenchyma is normal, without evidence of acute or chronic infarction. Vascular: No abnormal hyperdensity of the major intracranial arteries or dural venous sinuses. No intracranial atherosclerosis. Skull: The visualized skull base, calvarium and extracranial soft tissues are normal. Sinuses/Orbits: No fluid levels or advanced mucosal thickening of the visualized paranasal sinuses. No mastoid or middle ear effusion. The orbits are normal. ASPECTS [facility]([place] Stroke Program Early CT Score) - Ganglionic level infarction (caudate, lentiform nuclei, internal  capsule, insula, M1-M3 cortex): 7 - Supraganglionic infarction (M4-M6 cortex): 3 Total score (0-10 with 10 being normal): 10 IMPRESSION: 1. Normal brain 2. ASPECTS is 10. These results were communicated to Dr. Ritta SlotMcNeill Kirkpatrick at 8:52 pm on 02/15/2019 by text page via the [facility] messaging system. Electronically Signed   By: Deatra RobinsonKevin  Herman M.D.   On: 02/15/2019 20:53   Ct Head Wo Contrast  Result Date: 02/12/2019 CLINICAL DATA:  Headache, dizziness and neck pain. EXAM: CT HEAD WITHOUT CONTRAST TECHNIQUE: Contiguous axial images were obtained from the base of the skull through the vertex without intravenous contrast. COMPARISON:  11/22/2015. FINDINGS: Brain: Normal appearing cerebral hemispheres and posterior fossa structures. Normal size and position of the ventricles. No intracranial hemorrhage, mass lesion or CT evidence of acute infarction. Vascular: No hyperdense vessel or unexpected calcification. Skull: Normal. Negative for fracture or focal lesion. Sinuses/Orbits: Unremarkable. Other: None. IMPRESSION: Normal examination. Electronically Signed   By: Beckie SaltsSteven  Reid M.D.   On: 02/12/2019 20:06   Ct Angio Neck W Or Wo Contrast  Result Date: 02/15/2019 CLINICAL DATA:  Confusion.  Altered mental status. EXAM: CT ANGIOGRAPHY HEAD AND NECK TECHNIQUE: Multidetector CT imaging of the head and neck was performed using the standard protocol during bolus administration of intravenous contrast. Multiplanar CT image reconstructions and MIPs were obtained to evaluate the vascular anatomy. Carotid stenosis measurements (when applicable) are obtained utilizing NASCET criteria, using the distal internal carotid diameter as the denominator. CONTRAST:  100mL OMNIPAQUE IOHEXOL 350 MG/ML SOLN COMPARISON:  Head CT earlier same day FINDINGS: CTA NECK FINDINGS Aortic arch: Aortic atherosclerosis.  No aneurysm or dissection. Right carotid system: Common carotid artery widely patent to the bifurcation. Mild soft plaque at the  carotid bifurcation but no stenosis. Cervical ICA is tortuous but widely patent. Left carotid system: Common carotid artery widely patent to the bifurcation. Carotid bifurcation is normal. Cervical ICA is tortuous but widely patent. Vertebral arteries: Both vertebral artery origins are widely patent. Both vertebral arteries appear normal through the cervical region to the foramen magnum. Skeleton: Ordinary cervical spondylosis. Other neck: No mass or lymphadenopathy. Upper chest: Negative Review of the MIP images confirms the above findings CTA HEAD FINDINGS Anterior circulation: Both internal carotid arteries are widely patent through the skull base and siphon regions. No siphon stenosis. Right anterior and middle cerebral vessels appear widely patent and normal. On the left, the A1 segment is diminutive. This is presumed be congenital but it could be due to acquired disease. The left M1 vessel is widely patent. I suspicious for a left MCA branch vessel occlusion, though this is difficult to specifically identify. There appear subjectively to be less opacified vessels in the MCA territory. M2 superior segment branch occlusion is suspected. Posterior circulation: Both vertebral arteries are widely patent to the basilar. Basilar artery is small but patent. Posterior circulation branch vessels are patent. The left PCA arises from the anterior circulation. Venous sinuses: Patent and normal. Anatomic variants: None significant. Delayed phase: Not performed. Review of the MIP  images confirms the above findings IMPRESSION: Suspicion of left M2 superior segment occlusion. These results were communicated to Dr. Leonel Ramsay at 10:04 pmon 7/21/2020by text page via the [facility] messaging system. Electronically Signed   By: Nelson Chimes M.D.   On: 02/15/2019 22:05   Mr Jeri Cos PP Contrast  Result Date: 02/16/2019 CLINICAL DATA:  Speech difficulty. EXAM: MRI HEAD WITHOUT AND WITH CONTRAST TECHNIQUE: Multiplanar, multiecho pulse  sequences of the brain and surrounding structures were obtained without and with intravenous contrast. CONTRAST:  8.5 mL Gadavist COMPARISON:  Head CT and CTA head neck and CT perfusion 02/15/2019 FINDINGS: BRAIN: Punctate focus of abnormal diffusion restriction within the right corona radiata. No hemorrhage or mass effect. The midline structures are normal. There is no midline shift or mass effect. Multifocal white matter hyperintensity, most commonly due to chronic ischemic microangiopathy. The cerebral and cerebellar volume are age-appropriate. There is no hydrocephalus. Susceptibility-sensitive sequences show no chronic microhemorrhage or superficial siderosis. VASCULAR: The major intracranial arterial and venous sinus flow voids are normal. SKULL AND UPPER CERVICAL SPINE: Calvarial bone marrow signal is normal. There is no skull base mass. The visualized upper cervical spine and soft tissues are normal. SINUSES/ORBITS: There are no fluid levels or advanced mucosal thickening. The mastoid air cells and middle ear cavities are free of fluid. The orbits are normal. IMPRESSION: 1. Small focus of acute ischemia within the right corona radiata without hemorrhage or mass effect. 2. Mild white matter changes of chronic small vessel ischemia. Electronically Signed   By: Ulyses Jarred M.D.   On: 02/16/2019 03:13   Ct Cerebral Perfusion W Contrast  Result Date: 02/15/2019 CLINICAL DATA:  Code stroke.  Encephalopathy EXAM: CT HEAD WITHOUT CONTRAST CT CEREBRAL PERFUSION WITH CONTRAST TECHNIQUE: Contiguous axial images were obtained from the base of the skull through the vertex without intravenous contrast. Multiphase CT imaging of the brain was performed following IV bolus contrast injection. Subsequent parametric perfusion maps were calculated using RAPID software. COMPARISON:  None. FINDINGS: Brain: There is no mass, hemorrhage or extra-axial collection. The size and configuration of the ventricles and extra-axial CSF  spaces are normal. The brain parenchyma is normal, without evidence of acute or chronic infarction. Vascular: No abnormal hyperdensity of the major intracranial arteries or dural venous sinuses. No intracranial atherosclerosis. Skull: The visualized skull base, calvarium and extracranial soft tissues are normal. Sinuses/Orbits: No fluid levels or advanced mucosal thickening of the visualized paranasal sinuses. No mastoid or middle ear effusion. The orbits are normal. ASPECTS ([place] Stroke Program Early CT Score) - Ganglionic level infarction (caudate, lentiform nuclei, internal capsule, insula, M1-M3 cortex): 7 - Supraganglionic infarction (M4-M6 cortex): 3 Total score (0-10 with 10 being normal): 10 CT Brain Perfusion Findings: CBF (<30%) Volume: 24mL Perfusion (Tmax>6.0s) volume: 62mL Mismatch Volume: 56mL Infarction Location:none There is decreased CBV/CBF within the left hemisphere without corresponding increase in mean transit time, most convincing in the MCA territory. IMPRESSION: 1. No acute hemorrhage. 2. ASPECTS is 10. 3. No core infarct or ischemic penumbra as demonstrated by CT perfusion analysis. 4. Relatively decreased left hemisphere CBV/CBF without corresponding increase in MTT/Tmax, and not reaching the thresholds for determining infarct core. This is an atypical pattern, but a post-ictal state might conceivably cause such a pattern. MRI is recommended for further characterization. These results were communicated to Dr. Roland Rack at 11:43 pm on 02/15/2019 by text page via the [facility] messaging system. Electronically Signed   By: Ulyses Jarred M.D.   On: 02/15/2019 23:50  Dg Chest Port 1 View  Result Date: 02/16/2019 CLINICAL DATA:  Intubation. Respiratory failure. EXAM: PORTABLE CHEST 1 VIEW COMPARISON:  Radiograph 02/12/2019 FINDINGS: Endotracheal tube tip is 10 mm from the carina. Low lung volumes persist. Unchanged heart size and mediastinal contours. No acute airspace disease, large  pleural effusion or pneumothorax. IMPRESSION: 1. Endotracheal tube tip 10 mm from the carina. 2. Low lung volumes without acute abnormality. Electronically Signed   By: Narda RutherfordMelanie  Sanford M.D.   On: 02/16/2019 01:51   Dg Abd 2 Views  Result Date: 02/15/2019 CLINICAL DATA:  Abdominal pain EXAM: ABDOMEN - 2 VIEW COMPARISON:  01/21/2010 FINDINGS: The bowel gas pattern is normal. There is no evidence of free air. No radio-opaque calculi or other significant radiographic abnormality is seen. IMPRESSION: Negative. Electronically Signed   By: Charlett NoseKevin  Dover M.D.   On: 02/15/2019 10:59   Ct Head Code Stroke Wo Contrast  Result Date: 02/15/2019 CLINICAL DATA:  Code stroke.  Encephalopathy EXAM: CT HEAD WITHOUT CONTRAST CT CEREBRAL PERFUSION WITH CONTRAST TECHNIQUE: Contiguous axial images were obtained from the base of the skull through the vertex without intravenous contrast. Multiphase CT imaging of the brain was performed following IV bolus contrast injection. Subsequent parametric perfusion maps were calculated using RAPID software. COMPARISON:  None. FINDINGS: Brain: There is no mass, hemorrhage or extra-axial collection. The size and configuration of the ventricles and extra-axial CSF spaces are normal. The brain parenchyma is normal, without evidence of acute or chronic infarction. Vascular: No abnormal hyperdensity of the major intracranial arteries or dural venous sinuses. No intracranial atherosclerosis. Skull: The visualized skull base, calvarium and extracranial soft tissues are normal. Sinuses/Orbits: No fluid levels or advanced mucosal thickening of the visualized paranasal sinuses. No mastoid or middle ear effusion. The orbits are normal. ASPECTS ([place] Stroke Program Early CT Score) - Ganglionic level infarction (caudate, lentiform nuclei, internal capsule, insula, M1-M3 cortex): 7 - Supraganglionic infarction (M4-M6 cortex): 3 Total score (0-10 with 10 being normal): 10 CT Brain Perfusion Findings: CBF  (<30%) Volume: 0mL Perfusion (Tmax>6.0s) volume: 3mL Mismatch Volume: 0mL Infarction Location:none There is decreased CBV/CBF within the left hemisphere without corresponding increase in mean transit time, most convincing in the MCA territory. IMPRESSION: 1. No acute hemorrhage. 2. ASPECTS is 10. 3. No core infarct or ischemic penumbra as demonstrated by CT perfusion analysis. 4. Relatively decreased left hemisphere CBV/CBF without corresponding increase in MTT/Tmax, and not reaching the thresholds for determining infarct core. This is an atypical pattern, but a post-ictal state might conceivably cause such a pattern. MRI is recommended for further characterization. These results were communicated to Dr. Ritta SlotMcNeill Kirkpatrick at 11:43 pm on 02/15/2019 by text page via the [facility] messaging system. Electronically Signed   By: Deatra RobinsonKevin  Herman M.D.   On: 02/15/2019 23:50    Labs:  CBC: Recent Labs    02/12/19 1857 02/13/19 0629 02/14/19 0903 02/15/19 2127 02/16/19 0135  WBC 8.9 7.0 6.5 12.9*  --   HGB 12.4 11.9* 11.7* 12.7 13.3  HCT 39.0 38.7 37.2 41.3 39.0  PLT 396 360 398 470*  --     COAGS: No results for input(s): INR, APTT in the last 8760 hours.  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 Corliss Skains, findings revealed no arterial occlusions/intraluminal filling defects/dissections, but evidence of hypoplastic right transverse sinus  and left transverse sinus stenosis. Patient's condition stable- can spontaneously move all extremities with some weakness of LUE. Right groin incision stable. For LP today per neurology. Plan to follow-up with Dr. Corliss Skains in clinic 4 weeks after discharge to discuss management of bilateral transverse sinus stenosis. Further plans per neurology- appreciate and agree with management. Please call NIR with questions/concerns.   Electronically Signed: Elwin Mocha, PA-C 02/16/2019, 11:20 AM   I spent a total of 15 Minutes at the the patient's bedside AND on the patient's hospital floor or unit, greater than 50% of which was counseling/coordinating care for left MCA occlusion s/p diagnostic cerebral arteriogram.

## 2019-02-16 NOTE — Progress Notes (Signed)
PULMONARY / CRITICAL CARE MEDICINE   NAME:  Victoria Cox, MRN:  [id number], DOB:  07-16-1961, LOS: 4 ADMISSION DATE:  02/12/2019, CONSULTATION DATE: 02/16/2019 REFERRING MD: Cardiology, CHIEF COMPLAINT: Altered mental status  BRIEF HISTORY:    58 year old lady admitted 7/18 with generalized illness.  7/21 had AMS and concern for CVA so intubated and taken to IR.  HISTORY OF PRESENT ILLNESS   Victoria Cox is a 58 year old lady with history of rheumatoid arthritis (on methotrexate and humira) and diabetes mellitus type 2 who was admitted 7/18 with generalized body aches and poor appetite under cardiology.  Over the last day or so she became very confused and agitated.  On 7/21, there was concern for acute stroke patient was taken to interventional radiology under angiogram showed no occlusion.  Patient came back from there intubated due to agitation during procedure.  MRI demonstrated small focus of acute ischemia in the right corona radiata without hemorrhage.  SIGNIFICANT PAST MEDICAL HISTORY   Rheumatoid arthritis Type 2 diabetes mellitus  SIGNIFICANT EVENTS:  7/18 > admit 7/21 > to IR, no occlusion  7/22 > LP planned by neuro.  STUDIES:   CT head 7/21 > Suspicion of left M2 superior segment occlusion. Cerebral angiogram 7/22 > no occlusion. MRI brain 7/22 > Small focus of acute ischemia within the right corona radiata without hemorrhage or mass effect. Chronic small vessel ischemia. Echo 7/21 >  EEG 7/22 >   CULTURES:  Blood 7/18 >  Urine 7/19 > neg. SARS CoV 2 7/18 > neg. CSF 7/22 >   ANTIBIOTICS:  Ceftriaxone 7/18 > 7/22.  LINES/TUBES:  ET tube 7/21 >   CONSULTANTS:  Cardiology Neurology Interventional radiology  SUBJECTIVE:  Agitated.  Tolerated PSV well; therefore, extubated.  CONSTITUTIONAL: BP 118/82 (BP Location: Left Arm)   Pulse (!) 127   Temp (!) 102.4 F (39.1 C) (Axillary)   Resp (!) 29   Ht 5\' 3"  (1.6 m)   Wt 90.1 kg   SpO2 93%   BMI 35.19 kg/m    I/O last 3 completed shifts: In: 3680.6 [P.O.:838; I.V.:2025.9; IV Piggyback:816.7] Out: 1575 [Urine:1575]     Vent Mode: CPAP;PSV FiO2 (%):  [40 %] 40 % Set Rate:  [18 bmp] 18 bmp Vt Set:  [420 mL] 420 mL PEEP:  [5 cmH20] 5 cmH20 Pressure Support:  [5 cmH20] 5 cmH20 Plateau Pressure:  [15 cmH20-18 cmH20] 15 cmH20  PHYSICAL EXAM: General: Adult female, resting in bed, in NAD. Neuro: Follows basic commands. HEENT: [place]/AT. Sclerae anicteric. Cardiovascular: RRR, no M/R/G.  Lungs: Respirations even and unlabored.  CTA bilaterally, No W/R/R. Abdomen: BS x 4, soft, NT/ND.  Musculoskeletal: No gross deformities, no edema.  Skin: Intact, warm, no rashes.  ASSESSMENT AND PLAN    Respiratory insufficiency - due to inability to protect the airway in the setting of AMS + small right corona radiata infarct.  Extubated 7/22 and tolerated well. - Bronchial hygiene. - Avoid sedating or mind altering medications.  Small right corona radiata infarct. - Per neurology.  Acute encephalopathy - unclear etiology thus far. - Neurology planning LP 7/22. - F/u EEG.  Hypokalemia. - 60 mEq K per tube. - Repeat BMP at 1200.  Hx DM. - SSI. - Hold preadmission metformin, novolog.  Hx hypothyroidism. - Continue preadmission synthroid  Hx RA. - Continue preadmission methotrexate.   Nothing further to add.  PCCM will sign off.  Please do not hesitate to call 8/22 back if we can be of any further assistance.  Best Practice / Goals of Care / Disposition.   DVT PROPHYLAXIS: SCD. SUP: PPI. NUTRITION: N.P.O for now. MOBILITY: Bedrest. GOALS OF CARE: Full code. FAMILY DISCUSSIONS: Family updated by Dr. Leonie Man. DISPOSITION ICU.   Montey Hora, [facility] Pager: [phone].  If no answer, [phone] 02/16/2019, 9:31 AM

## 2019-02-16 NOTE — Progress Notes (Signed)
Patient transported from 4N27 to MRI and back with no complications.  

## 2019-02-16 NOTE — Progress Notes (Signed)
Patient arrived from IR with belongings: clothing and blanket at bedside. MD notified for BP medication. Goal SBP 120-180

## 2019-02-16 NOTE — Progress Notes (Signed)
eLink Physician-Brief Progress Note Patient Name: Aroura Vasudevan DOB: 08/27/1960 MRN: [id number]   Date of Service  02/16/2019  HPI/Events of Note  58 year old female initially presented on 02/12/2019 with myalgia, headache and aphasia.  This afternoon she developed altered mental status and there was a questionable M2 occlusion on CT angiogram.  eICU Interventions  She was intubated for thrombectomy but angiogram before the procedure ruled out M2 occlusion, so thrombectomy was never performed.  But the patient was left intubated as she was agitated and we are consulted for ventilator management.  I notified nurse practitioner Jerene Pitch.     Intervention Category Major Interventions: Change in mental status - evaluation and management;Respiratory failure - evaluation and management Intermediate Interventions: Communication with other healthcare providers and/or family Evaluation Type: New Patient Evaluation  Mady Gemma 02/16/2019, 1:00 AM

## 2019-02-16 NOTE — Progress Notes (Signed)
STROKE TEAM PROGRESS NOTE   INTERVAL HISTORY I have reviewed history of presenting illness in details with the patient and her son and brother-in-law.  She had subacute onset of generalized weakness, malaise and myalgias as well as headache.  Yesterday she had an abrupt change in mental status and was thought to have aphasia and emergent CT angiogram suggested diminished on the left and hence diagnostic cerebral catheter angiogram was performed for suspected left M2 lesion however it was negative.  Patient has been extubated this morning but remains sleepy and confused.  Blood pressures been adequate controlled.She spiked temp 102.4 today. MRI shows tiny punctate right parietal lacunar infarct likely incidental. EEG shows diffuse slowing and triphasics. No seizure activity  Vitals:   02/16/19 0722 02/16/19 0730 02/16/19 0800 02/16/19 0824  BP:  127/70 118/82   Pulse:  (!) 110 (!) 148 (!) 127  Resp:  18 (!) 25 (!) 29  Temp:   (!) 102.4 F (39.1 C)   TempSrc:   Axillary   SpO2:  100% 100% 93%  Weight: 90.1 kg     Height: 5\' 3"  (1.6 m)       CBC:  Recent Labs  Lab 02/12/19 1857  02/14/19 0903 02/15/19 2127 02/16/19 0135  WBC 8.9   < > 6.5 12.9*  --   NEUTROABS 6.5  --   --  10.6*  --   HGB 12.4   < > 11.7* 12.7 13.3  HCT 39.0   < > 37.2 41.3 39.0  MCV 86.5   < > 89.0 86.0  --   PLT 396   < > 398 470*  --    < > = values in this interval not displayed.    Basic Metabolic Panel:  Recent Labs  Lab 02/15/19 0920 02/15/19 2127 02/16/19 0135  NA 131* 132* 133*  K 3.6 3.5 3.1*  CL 97* 95*  --   CO2 23 22  --   GLUCOSE 193* 175*  --   BUN 8 7  --   CREATININE 0.61 0.59  --   CALCIUM 8.8* 8.9  --    Lipid Panel:     Component Value Date/Time   CHOL 167 02/16/2019 0639   CHOL 186 11/30/2014 1001   TRIG 262 (H) 02/16/2019 0639   HDL 30 (L) 02/16/2019 0639   HDL 53 11/30/2014 1001   CHOLHDL 5.6 02/16/2019 0639   VLDL 52 (H) 02/16/2019 0639   LDLCALC 85 02/16/2019 0639   LDLCALC 69 11/30/2014 1001   HgbA1c:  Lab Results  Component Value Date   HGBA1C 7.2 (H) 02/16/2019   PHYSICAL EXAM Middle-aged [place] origin lady who is not in distress. . Afebrile. Head is nontraumatic. Neck is supple without bruit.    Cardiac exam no murmur or gallop. Lungs are clear to auscultation. Distal pulses are well felt. Neurological Exam ; Patient is stuporous.  She can barely open eyes to sternal rub.  She cries out in her language to be left alone.  She is not following any commands.  Eyes are closed.  Pupils are 5 mm equal reactive.  Doll's eye movements are sluggish.  She moves all 4 extremities spontaneously and has brisk withdrawal which is purposeful to painful stimuli.  No focal weakness.  Plantars about downgoing. ASSESSMENT/PLAN Ms. Nadiyah Zeis is a 58 y.o. female with history of RA who presented to [place] last week with several week hx of myalgias, HA, mouth sores, and generalized weakness. Confusion present but  cleared following admission. 7/21 developed sudden onset confusion with expressive aphasia and mild R facial droop. CTA showed possible LVO and she was transferred to [facility] where cerebral angio found no intervenable lesion.  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  CT head 7/21 2044 No acute abnormality. ASPECTS 10.     CTA head & neck 7/21 2151 suspicious L M2 occlusion  Code Stroke CT head 7/21 2252 no ICH. ASPECTS 10.  CT perfusion no penumbra  Cerebral angio  No intervenable lesion  MRI  Small R corona radiata infarct. Mild SVD.  2D Echo pending  LDL 85  HgbA1c 7.2  HIV neg  Lovenox 40 mg sq daily  for VTE prophylaxis Diet Order            Diet NPO time specified  Diet effective now              No antithrombotic prior to admission, now on aspirin 300 mg suppository daily/325 po daily. Given mild stroke, recommend aspirin 81 mg and plavix 75 mg daily x 3 weeks, then aspirin alone.   Therapy recommendations:  pending   Disposition:  pending   Acute Respiratory Failure  Intubated for IR, initially unable to extubated d/t agitation  Extubated 7/22 w/o difficulty  CCM on board  Fever w/ Leukocytosis  TMax 102.4  WBC 12.9  CXR low vols  UA 7/19 ok, Cx neg  LP pending   Hypertension  Stable - high yesterday but improved . Permissive hypertension (OK if < 220/120) but gradually normalize in 5-7 days . Long-term BP goal normotensive  Hyperlipidemia  Home meds:  No statin  LDL 85, goal < 70  Add low dose statin once able to swallow, will not consider high intensity statin given low LDL and already existing myalgias  Continue statin at discharge  Diabetes type II Uncontrolled  HgbA1c 7.2, goal < 7.0  CBGs  SSI  Other Stroke Risk Factors  Obesity, Body mass index is 35.19 kg/m., recommend weight loss, diet and exercise as appropriate   Other Active Problems  RA. Off methotrexate, on Humira  Hypokalemia 3.1  Hospital day # 4  I have personally obtained history,examined this patient, reviewed notes, independently viewed imaging studies, participated in medical decision making and plan of care.ROS completed by me personally and pertinent positives fully documented  I have made any additions or clarifications directly to the above note.  She presented with subacute symptoms of generalized weakness, malaise, headache and had abrupt mental status change yesterday prompting evaluation for stroke with CT angiogram suggesting possible left M2 occlusion but emergent cerebral angiogram was negative.  She has spiked fever and remains with altered mental status.   EEG does not show any ongoing seizure activity.  Recommend spinal tap to look for meningitis given her immunosuppressed state rule out fungal or TB meningitis.  Long discussion with Dr. Algie Coffer at the bedside as well as with patient's son and brother-in-law and answered questions.  Patient will be transferred to the neuro hospitalist service starting tomorrow. This patient is critically ill and at significant risk of neurological worsening, death and care requires constant monitoring of vital signs, hemodynamics,respiratory and  cardiac monitoring, extensive review of multiple databases, frequent neurological assessment, discussion with family, other specialists and medical decision making of high complexity.I have made any additions or clarifications directly to the above note.This critical care time does not reflect procedure time, or teaching time or supervisory time of PA/NP/Med Resident etc but could involve care discussion time.  I spent 50 minutes of neurocritical care time  in the care of  this patient.     Delia Heady, MD Medical Director [facility] Pager: [phone] 02/16/2019 12:27 PM   To contact Stroke Continuity provider, please refer to [email or web address]. After hours, contact General Neurology

## 2019-02-16 NOTE — Procedures (Signed)
Extubation Procedure Note  Patient Details:   Name: Victoria Cox DOB: [birth year] MRN: [id number]   Airway Documentation:    Vent end date: 02/16/19 Vent end time: 0817   Evaluation  O2 sats: stable throughout Complications: No apparent complications Patient did tolerate procedure well. Bilateral Breath Sounds: Clear, Diminished   Yes   Pt extubated to N/C.  No stridor noted.  RN @ bedside.  Donnetta Hail 02/16/2019, 8:18 AM

## 2019-02-16 NOTE — Anesthesia Postprocedure Evaluation (Signed)
Anesthesia Post Note  Patient: Victoria Cox  Procedure(s) Performed: IR WITH ANESTHESIA (N/A )     Patient location during evaluation: ICU Anesthesia Type: General Level of consciousness: sedated and patient remains intubated per anesthesia plan Pain management: pain level controlled Vital Signs Assessment: post-procedure vital signs reviewed and stable Respiratory status: patient remains intubated per anesthesia plan Cardiovascular status: stable and tachycardic Postop Assessment: no apparent nausea or vomiting Anesthetic complications: no    Last Vitals:  Vitals:   02/16/19 0045 02/16/19 0100  BP: (!) 175/96   Pulse: (!) 120 (!) 113  Resp: (!) 25 18  Temp: 37.4 C   SpO2: 97% 98%    Last Pain:  Vitals:   02/16/19 0045  TempSrc: Axillary  PainSc:                  Victoria Cox

## 2019-02-17 ENCOUNTER — Encounter (HOSPITAL_COMMUNITY): Payer: Self-pay | Admitting: Interventional Radiology

## 2019-02-17 DIAGNOSIS — R4182 Altered mental status, unspecified: Secondary | ICD-10-CM

## 2019-02-17 LAB — CSF CELL COUNT WITH DIFFERENTIAL
Eosinophils, CSF: 0 % (ref 0–1)
Eosinophils, CSF: 0 % (ref 0–1)
Lymphs, CSF: 2 % — ABNORMAL LOW (ref 40–80)
Lymphs, CSF: 4 % — ABNORMAL LOW (ref 40–80)
Monocyte-Macrophage-Spinal Fluid: 83 % — ABNORMAL HIGH (ref 15–45)
Monocyte-Macrophage-Spinal Fluid: 88 % — ABNORMAL HIGH (ref 15–45)
RBC Count, CSF: 17 /mm3 — ABNORMAL HIGH
RBC Count, CSF: 17 /mm3 — ABNORMAL HIGH
Segmented Neutrophils-CSF: 15 % — ABNORMAL HIGH (ref 0–6)
Segmented Neutrophils-CSF: 8 % — ABNORMAL HIGH (ref 0–6)
Tube #: 4
WBC, CSF: 320 /mm3 (ref 0–5)
WBC, CSF: 425 /mm3 (ref 0–5)

## 2019-02-17 LAB — CULTURE, BLOOD (ROUTINE X 2)
Culture: NO GROWTH
Culture: NO GROWTH
Special Requests: ADEQUATE

## 2019-02-17 LAB — HSV DNA BY PCR (REFERENCE LAB)
HSV 1 DNA: NEGATIVE
HSV 2 DNA: NEGATIVE

## 2019-02-17 LAB — ACID FAST SMEAR (AFB, MYCOBACTERIA): Acid Fast Smear: NEGATIVE

## 2019-02-17 LAB — GLUCOSE, CAPILLARY
Glucose-Capillary: 136 mg/dL — ABNORMAL HIGH (ref 70–99)
Glucose-Capillary: 161 mg/dL — ABNORMAL HIGH (ref 70–99)
Glucose-Capillary: 150 mg/dL — ABNORMAL HIGH (ref 70–99)
Glucose-Capillary: 171 mg/dL — ABNORMAL HIGH (ref 70–99)

## 2019-02-17 LAB — CMV DNA, QUANTITATIVE, PCR
CMV DNA Quant: NEGATIVE IU/mL
Log10 CMV Qn DNA Pl: UNDETERMINED log10 IU/mL

## 2019-02-17 LAB — PATHOLOGIST SMEAR REVIEW

## 2019-02-17 LAB — HERPES SIMPLEX VIRUS(HSV) DNA BY PCR

## 2019-02-17 MED ORDER — BOOST / RESOURCE BREEZE PO LIQD CUSTOM
1.0000 | Freq: Three times a day (TID) | ORAL | Status: DC
  Administered 2019-02-17: 1 via ORAL
  Administered 2019-02-17: 22:00:00 via ORAL
  Administered 2019-02-18 – 2019-02-21 (×4): 1 via ORAL

## 2019-02-17 MED ORDER — ACETAMINOPHEN 325 MG PO TABS
650.0000 mg | ORAL_TABLET | Freq: Four times a day (QID) | ORAL | Status: DC | PRN
  Administered 2019-02-17 – 2019-02-21 (×6): 650 mg via ORAL
  Filled 2019-02-17 (×5): qty 2

## 2019-02-17 MED ORDER — SODIUM CHLORIDE 0.9 % IV SOLN
100.0000 mg | Freq: Two times a day (BID) | INTRAVENOUS | Status: DC
  Administered 2019-02-17 – 2019-02-19 (×4): 100 mg via INTRAVENOUS
  Filled 2019-02-17 (×7): qty 100

## 2019-02-17 NOTE — Progress Notes (Signed)
[facility] for Infectious Disease   Reason for visit: Follow up on fatigue/weakness AMS  Interval History: CSF studies reveal elevated WBC to 425 with monocyte predominance. In setting of AMS, there is concern for encephalitis. Patient started on acyclovir. HSV, CMV, VZV, arboviral studies pending. IV doxycycline added this morning for RMSF, serologies sent.  Subjective: Patient family member at bedside and translating. Patient states that she is not in any pain or discomfort. She correctly states her name, the month, and that she is in the hospital because she is sick. Patient denies fever, nausea, or pain. Patient answers questions appropriately but appears very drowsy.  Physical Exam: Constitutional: Appears in NAD, oriented but drowsy Vitals:   02/17/19 0800 02/17/19 0900  BP: 127/75 131/73  Pulse: 81 90  Resp: (!) 35 19  Temp: 98.2 F (36.8 C)   SpO2: 99% 99%  Respiratory: Normal respiratory effort; CTA B Cardiovascular: RRR GI: soft, nt, nd  Review of Systems: Review of Systems  Constitutional: Negative for fever.  Cardiovascular: Negative for chest pain.  Gastrointestinal: Negative for nausea.     Lab Results  Component Value Date   WBC 12.9 (H) 02/15/2019   HGB 13.3 02/16/2019   HCT 39.0 02/16/2019   MCV 86.0 02/15/2019   PLT 470 (H) 02/15/2019    Lab Results  Component Value Date   CREATININE 0.59 02/15/2019   BUN 7 02/15/2019   NA 133 (L) 02/16/2019   K 3.1 (L) 02/16/2019   CL 95 (L) 02/15/2019   CO2 22 02/15/2019    Lab Results  Component Value Date   ALT 20 02/12/2019   AST 19 02/12/2019   ALKPHOS 87 02/12/2019     Microbiology: Recent Results (from the past 240 hour(s))  SARS Coronavirus 2 (CEPHEID - Performed in [facility] hospital lab), Hosp Order     Status: None   Collection Time: 02/12/19  6:28 PM   Specimen: Nasopharyngeal Swab  Result Value Ref Range Status   SARS Coronavirus 2 NEGATIVE NEGATIVE Final    Comment: (NOTE) If  result is NEGATIVE SARS-CoV-2 target nucleic acids are NOT DETECTED. The SARS-CoV-2 RNA is generally detectable in upper and lower  respiratory specimens during the acute phase of infection. The lowest  concentration of SARS-CoV-2 viral copies this assay can detect is 250  copies / mL. A negative result does not preclude SARS-CoV-2 infection  and should not be used as the sole basis for treatment or other  patient management decisions.  A negative result may occur with  improper specimen collection / handling, submission of specimen other  than nasopharyngeal swab, presence of viral mutation(s) within the  areas targeted by this assay, and inadequate number of viral copies  (<250 copies / mL). A negative result must be combined with clinical  observations, patient history, and epidemiological information. If result is POSITIVE SARS-CoV-2 target nucleic acids are DETECTED. The SARS-CoV-2 RNA is generally detectable in upper and lower  respiratory specimens dur ing the acute phase of infection.  Positive  results are indicative of active infection with SARS-CoV-2.  Clinical  correlation with patient history and other diagnostic information is  necessary to determine patient infection status.  Positive results do  not rule out bacterial infection or co-infection with other viruses. If result is PRESUMPTIVE POSTIVE SARS-CoV-2 nucleic acids MAY BE PRESENT.   A presumptive positive result was obtained on the submitted specimen  and confirmed on repeat testing.  While 2019 novel coronavirus  (SARS-CoV-2) nucleic  acids may be present in the submitted sample  additional confirmatory testing may be necessary for epidemiological  and / or clinical management purposes  to differentiate between  SARS-CoV-2 and other Sarbecovirus currently known to infect humans.  If clinically indicated additional testing with an alternate test  methodology [id number]) is advised. The SARS-CoV-2 RNA is generally   detectable in upper and lower respiratory sp ecimens during the acute  phase of infection. The expected result is Negative. Fact Sheet for Patients:  BoilerBrush.com.cy Fact Sheet for Healthcare Providers: https://pope.com/ This test is not yet approved or cleared by the [place] FDA and has been authorized for detection and/or diagnosis of SARS-CoV-2 by FDA under an Emergency Use Authorization (EUA).  This EUA will remain in effect (meaning this test can be used) for the duration of the COVID-19 declaration under Section 564(b)(1) of the Act, 21 U.S.C. section 360bbb-3(b)(1), unless the authorization is terminated or revoked sooner. Performed at [facility], [address], [place], [place] [address]   Culture, blood (routine x 2)     Status: None   Collection Time: 02/12/19  6:57 PM   Specimen: BLOOD LEFT HAND  Result Value Ref Range Status   Specimen Description   Final    BLOOD LEFT HAND Performed at [facility], [address], [place], [place] [address]    Special Requests   Final    BOTTLES DRAWN AEROBIC AND ANAEROBIC Blood Culture results may not be optimal due to an excessive volume of blood received in culture bottles Performed at [facility], [address], [place], [place] [address]    Culture   Final    NO GROWTH 5 DAYS Performed at [facility], [address] [place], [place] [address]    Report Status 02/17/2019 FINAL  Final  Culture, blood (routine x 2)     Status: None   Collection Time: 02/12/19  6:57 PM   Specimen: BLOOD RIGHT HAND  Result Value Ref Range Status   Specimen Description   Final    BLOOD RIGHT HAND Performed at [facility], [address] [place], [place] [address]    Special Requests   Final    BOTTLES DRAWN AEROBIC AND ANAEROBIC Blood Culture adequate volume Performed at [facility], [address] [place], [place] [address]    Culture   Final    NO GROWTH 5 DAYS Performed at [facility], [address] [place], [place] [address]    Report Status 02/17/2019 FINAL  Final  Urine culture     Status: None   Collection Time: 02/13/19  1:31 AM   Specimen: Urine, Random  Result Value Ref Range Status   Specimen Description   Final    URINE, RANDOM Performed at [facility], [address], [place], [place] [address]    Special Requests   Final    NONE Performed at [facility], [address] [place], [place] [address]    Culture   Final    NO GROWTH Performed at [facility], [address], [place], [place] [address]    Report Status 02/14/2019 FINAL  Final  MRSA PCR Screening     Status: None   Collection Time: 02/16/19 12:40 AM   Specimen: Nasal Mucosa; Nasopharyngeal  Result Value Ref Range Status   MRSA by PCR NEGATIVE NEGATIVE Final    Comment:  The GeneXpert MRSA Assay (FDA approved for NASAL specimens only), is one component of a comprehensive MRSA colonization surveillance program. It is not intended to diagnose MRSA infection nor to guide or monitor treatment for MRSA infections. Performed at [facility], [address], [place], [place] [address]   CSF culture with Stat gram stain     Status: None (Preliminary result)   Collection Time: 02/16/19  1:39 PM   Specimen: CSF; Cerebrospinal Fluid  Result Value Ref Range Status   Specimen Description CSF  Final   Special Requests Immunocompromised  Final   Gram Stain   Final    WBC PRESENT,BOTH PMN AND MONONUCLEAR NO ORGANISMS SEEN CYTOSPIN SMEAR    Culture   Final    NO GROWTH < 24 HOURS Performed at [facility], [address], [place], [place] [address]    Report Status PENDING  Incomplete    Impression: Ms. 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 LP findings concerning for possible encephalitis 1. Followup on HSV, CMV, VZV, arbovirus panel and RMSF serologies 2. Continue on doxycycline and acyclovir for now

## 2019-02-17 NOTE — Progress Notes (Signed)
Nutrition Follow-up  DOCUMENTATION CODES:   Not applicable  INTERVENTION:   - Boost Breeze po TID, each supplement provides 250 kcal and 9 grams of protein  - Magic cup TID with meals, each supplement provides 290 kcal and 9 grams of protein  - Continue MVI with minerals daily  NUTRITION DIAGNOSIS:   Inadequate oral intake related to decreased appetite as evidenced by (per chart/MST report).  Ongoing, being addressed via oral nutrition supplements  GOAL:   Patient will meet greater than or equal to 90% of their needs  Progressing  MONITOR:   PO intake, Supplement acceptance, Labs, Weight trends, I & O's  REASON FOR ASSESSMENT:   Malnutrition Screening Tool    ASSESSMENT:   58 year old female with PMHx of DM, HTN, arthritis admitted with generalized weakness, myalgias.  7/22 - angiogram showing no occlusion, intubated, later extubated, LP  Results of LP concerning for possible bacterial/fungal meningitis.  Spoke with pt's family at bedside. Pt lethargic at time of RD visit.  Pt's family reports pt has not eaten much today and that the last time she had something solid to eat was either yesterday or the day before. Pt's family reports that pt's PO intake has been poor for about 1 month PTA.  Pt's family shares that pt does not like Ensure Enlive. They are willing to try Boost Breeze and [employer].  Noted family had brought in some food from home (pureed rice). Pt's family states that they have tried to feed her some but that she is too lethargic. Noted family trying to feed pt again at end of RD visit. Encouraged pt's family to encourage pt to eat.  Meal Completion: 0-50% x last 6 recorded meals  Medications reviewed and include: vitamin B-12, folic acid, SSI, MVI with minerals, Zofran 4 mg TID before meals, Protonix, Senna, IV abx IVF: NS @ 75 ml/hr  Labs reviewed: sodium 133, potassium 3.1, HDL 30, triglycerides 262 CBG's: 111-161 x 24 hours  UOP: 775 ml x  24 hours I/O's: +6.9 L since admit  NUTRITION - FOCUSED PHYSICAL EXAM:    Most Recent Value  Orbital Region  No depletion  Upper Arm Region  No depletion  Thoracic and Lumbar Region  No depletion  Buccal Region  No depletion  Temple Region  No depletion  Clavicle Bone Region  No depletion  Clavicle and Acromion Bone Region  Mild depletion  Scapular Bone Region  No depletion  Dorsal Hand  No depletion  Patellar Region  No depletion  Anterior Thigh Region  No depletion  Posterior Calf Region  Mild depletion  Edema (RD Assessment)  None  Hair  Reviewed  Eyes  Reviewed  Mouth  Reviewed  Skin  Reviewed  Nails  Reviewed       Diet Order:   Diet Order            Diet vegetarian Room service appropriate? Yes; Fluid consistency: Thin  Diet effective now              EDUCATION NEEDS:   No education needs have been identified at this time  Skin:  Skin Assessment: Skin Integrity Issues: Incisions: right groin Other: wound to left arm  Last BM:  02/15/19  Height:   Ht Readings from Last 1 Encounters:  02/16/19 5\' 3"  (1.6 m)    Weight:   Wt Readings from Last 1 Encounters:  02/16/19 90.1 kg    Ideal Body Weight:  45.5 kg  BMI:  Body mass index is 35.19 kg/m.  Estimated Nutritional Needs:   Kcal:  1700-1900  Protein:  85-95 grams  Fluid:  1.7-1.9 L/day    Gaynell Face, MS, RD, LDN Inpatient Clinical Dietitian Pager: [phone] Weekend/After Hours: [phone]

## 2019-02-17 NOTE — Progress Notes (Addendum)
Physical Therapy Treatment Patient Details Name: Victoria Cox MRN: [id number] DOB: 05-Nov-1960 Today's Date: 02/17/2019    History of Present Illness 58 y.o.femalewith a history of rheumatoid arthritis who presents with a several week history of myalgias, headache, mouth sores, generalized weakness. She had an episode of worsening confusion and Speech disturbance on 02/15/2019. 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 EEG shows diffuse slowing and triphasics. Pt also now s/p spinal tap on 7/22.     PT Comments    Improved movement, general strength; Still flat and low arousal level.  Not sure how much she follows commands.  Still think she is a good rehab candidate.    Follow Up Recommendations  CIR;Supervision/Assistance - 24 hour     Equipment Recommendations  Other (comment)(TBA)    Recommendations for Other Services       Precautions / Restrictions Precautions Precautions: Fall    Mobility  Bed Mobility Overal bed mobility: Needs Assistance Bed Mobility: Supine to Sit     Supine to sit: Min assist     General bed mobility comments: truncal assist to transition forward to EOB.  Transfers Overall transfer level: Needs assistance   Transfers: Sit to/from [employer] Transfers Sit to Stand: Min assist;Mod assist;+2 safety/equipment         General transfer comment: pt moving automatically, minimally impulsively, not waiting for direction.  Needing stability assist.  Ambulation/Gait             General Gait Details: NT due to pt fatigue   Stairs             Wheelchair Mobility    Modified Rankin (Stroke Patients Only) Modified Rankin (Stroke Patients Only) Modified Rankin: Moderately severe disability     Balance Overall balance assessment: Needs assistance   Sitting balance-Leahy Scale: Fair Sitting balance - Comments: Not formally assess due to arousal levels, but sat EOB without UE assist or external support briefly      Standing balance-Leahy Scale: Poor Standing balance comment: reliant on external support                            Cognition Arousal/Alertness: Lethargic Behavior During Therapy: Flat affect(less agitated, but mild) Overall Cognitive Status: Difficult to assess                                 General Comments: Still too lethargic to determine      Exercises      General Comments        Pertinent Vitals/Pain Pain Assessment: Faces Faces Pain Scale: Hurts a little bit Pain Location: generalized  Pain Descriptors / Indicators: Grimacing Pain Intervention(s): Monitored during session    Home Living Family/patient expects to be discharged to:: Private residence Living Arrangements: Children;Other relatives;Other (Comment)(son/dtr in law, 2 grandchildren and pt.) Available Help at Discharge: Family;Available PRN/intermittently Type of Home: House Home Access: Stairs to enter   Home Layout: Two level;Able to live on main level with bedroom/bathroom Home Equipment: [employer] - single point      Prior Function Level of Independence: Independent      Comments: fully helping run the household.  All iADL's   PT Goals (current goals can now be found in the care plan section) Acute Rehab PT Goals Patient Stated Goal: none stated PT Goal Formulation: Patient unable to participate in goal setting Time For Goal Achievement: 03/02/19 Potential  to Achieve Goals: Good Progress towards PT goals: Progressing toward goals    Frequency    Min 4X/week      PT Plan      Co-evaluation              AM-PAC PT "6 Clicks" Mobility   Outcome Measure  Help needed turning from your back to your side while in a flat bed without using bedrails?: A Lot Help needed moving from lying on your back to sitting on the side of a flat bed without using bedrails?: A Lot Help needed moving to and from a bed to a chair (including a wheelchair)?: A Little Help needed  standing up from a chair using your arms (e.g., wheelchair or bedside chair)?: A Little Help needed to walk in hospital room?: A Little Help needed climbing 3-5 steps with a railing? : A Lot 6 Click Score: 15    End of Session Equipment Utilized During Treatment: Oxygen Activity Tolerance: Patient limited by lethargy(mildly agitated) Patient left: in chair;with call bell/phone within reach;with chair alarm set;with family/visitor present Nurse Communication: Mobility status PT Visit Diagnosis: Other abnormalities of gait and mobility (R26.89)     Time: 8338-2505 PT Time Calculation (min) (ACUTE ONLY): 24 min  Charges:  $Therapeutic Activity: 23-37 mins                     02/17/2019  Donnella Sham, PT Acute Rehabilitation Services [phone]  (pager) [phone]  (office)   Victoria Cox 02/17/2019, 11:10 AM

## 2019-02-17 NOTE — Progress Notes (Signed)
Ref: Victoria Dials, MD   Subjective:  More awake and alert. Weakness persist. Denies headache for first time this admission. Fever subsiding.vNow on acyclovir. Lumbar puncture positive for elevated opening pressure and elevated protein. Cultures and viral serology results pending. Cryptococcal Ag negative. Gram stain negative for organisms. Hgb A1C is 7.2 Echocardiogram shows normal LV systolic fuinction EEG is negative for seizure activity.   Objective:  Vital Signs in the last 24 hours: Temp:  [98.2 F (36.8 C)-101.5 F (38.6 C)] 98.2 F (36.8 C) (07/23 0800) Pulse Rate:  [79-114] 81 (07/23 0800) Cardiac Rhythm: Normal sinus rhythm (07/23 0800) Resp:  [18-35] 35 (07/23 0800) BP: (85-157)/(41-93) 127/75 (07/23 0800) SpO2:  [93 %-100 %] 99 % (07/23 0800)  Physical Exam: BP Readings from Last 1 Encounters:  02/17/19 127/75     Wt Readings from Last 1 Encounters:  02/16/19 90.1 kg    Weight change:  Body mass index is 35.19 kg/m. HEENT: [place]/AT, Eyes-Brown, PERL, EOMI, Conjunctiva-Pink, Sclera-Non-icteric Neck: No JVD, No bruit, Trachea midline. Lungs:  Clear, Bilateral. Cardiac:  Regular rhythm, normal S1 and S2, no S3. II/VI systolic murmur. Abdomen:  Soft, non-tender. BS present. Extremities:  No edema present. No cyanosis. No clubbing. Left shoulder wound is dry. Right groin puncture wound and right labial puncture wound present. CNS: AxOx3, Cranial nerves grossly intact, moves all 4 extremities.  Skin: Warm and dry.   Intake/Output from previous day: 07/22 0701 - 07/23 0700 In: 1849.2 [I.V.:1621.3; IV Piggyback:227.9] Out: 775 [Urine:775]    Lab Results: BMET    Component Value Date/Time   NA 133 (L) 02/16/2019 0135   NA 132 (L) 02/15/2019 2127   NA 131 (L) 02/15/2019 0920   NA 139 11/30/2014 1001   K 3.1 (L) 02/16/2019 0135   K 3.5 02/15/2019 2127   K 3.6 02/15/2019 0920   CL 95 (L) 02/15/2019 2127   CL 97 (L) 02/15/2019 0920   CL 97 (L) 02/15/2019 0632    CO2 22 02/15/2019 2127   CO2 23 02/15/2019 0920   CO2 22 02/15/2019 0632   GLUCOSE 175 (H) 02/15/2019 2127   GLUCOSE 193 (H) 02/15/2019 0920   GLUCOSE 128 (H) 02/15/2019 0632   BUN 7 02/15/2019 2127   BUN 8 02/15/2019 0920   BUN 7 02/15/2019 0632   BUN 11 11/30/2014 1001   CREATININE 0.59 02/15/2019 2127   CREATININE 0.61 02/15/2019 0920   CREATININE 0.56 02/15/2019 0632   CALCIUM 8.9 02/15/2019 2127   CALCIUM 8.8 (L) 02/15/2019 0920   CALCIUM 8.7 (L) 02/15/2019 0632   GFRNONAA >60 02/15/2019 2127   GFRNONAA >60 02/15/2019 0920   GFRNONAA >60 02/15/2019 0632   GFRAA >60 02/15/2019 2127   GFRAA >60 02/15/2019 0920   GFRAA >60 02/15/2019 0632   CBC    Component Value Date/Time   WBC 12.9 (H) 02/15/2019 2127   RBC 4.80 02/15/2019 2127   HGB 13.3 02/16/2019 0135   HGB 12.5 11/30/2014 1001   HCT 39.0 02/16/2019 0135   HCT 38.6 11/30/2014 1001   PLT 470 (H) 02/15/2019 2127   PLT 369 11/30/2014 1001   MCV 86.0 02/15/2019 2127   MCV 83 11/30/2014 1001   MCH 26.5 02/15/2019 2127   MCHC 30.8 02/15/2019 2127   RDW 14.7 02/15/2019 2127   RDW 14.1 11/30/2014 1001   LYMPHSABS 1.4 02/15/2019 2127   LYMPHSABS 2.2 11/30/2014 1001   MONOABS 0.8 02/15/2019 2127   EOSABS 0.0 02/15/2019 2127   EOSABS 0.2 11/30/2014 1001  BASOSABS 0.1 02/15/2019 2127   BASOSABS 0.0 11/30/2014 1001   HEPATIC Function Panel Recent Labs    02/12/19 1857  PROT 8.1   HEMOGLOBIN A1C No components found for: HGA1C,  MPG CARDIAC ENZYMES Lab Results  Component Value Date   CKTOTAL 18 (L) 02/14/2019   CKMB 0.9 02/14/2019   BNP No results for input(s): PROBNP in the last 8760 hours. TSH Recent Labs    02/13/19 0629  TSH 0.367   CHOLESTEROL Recent Labs    02/16/19 0639  CHOL 167    Scheduled Meds: .  stroke: mapping our early stages of recovery book   Does not apply Once  . amLODipine  5 mg Oral Daily  . aspirin  300 mg Rectal Daily   Or  . aspirin  325 mg Oral Daily  .  Chlorhexidine Gluconate Cloth  6 each Topical Daily  . clopidogrel  75 mg Oral Daily  . cyanocobalamin  1,000 mcg Intramuscular Once  . enoxaparin (LOVENOX) injection  40 mg Subcutaneous QHS  . folic acid  2 mg Oral Daily  . folic acid  1 mg Intravenous Once  . insulin aspart  0-9 Units Subcutaneous TID WC  . irbesartan  75 mg Oral Daily  . levothyroxine  75 mcg Oral Q0600  . methotrexate  20 mg Oral Weekly  . metoprolol succinate  50 mg Oral Daily  . multivitamin with minerals  1 tablet Oral Daily  . ondansetron (ZOFRAN) IV  4 mg Intravenous TID AC  . pantoprazole (PROTONIX) IV  40 mg Intravenous Q24H  . senna-docusate  1 tablet Oral Once   Continuous Infusions: . sodium chloride    . sodium chloride 100 mL/hr at 02/17/19 0400  . 0.9 % NaCl with KCl 20 mEq / L Stopped (02/15/19 2025)  . acyclovir Stopped (02/17/19 0341)  . doxycycline (VIBRAMYCIN) IV    . niCARDipine Stopped (02/16/19 0209)   PRN Meds:.acetaminophen, Muscle Rub  Assessment/Plan: Possible viral meningitis Recent or remote lacunar infarct of right corona radiata Fever, improving Generalized weakness Neck pain, resolved Headache, resolved Type 2 DM Obesity Rheumatoid Arthritis  Appreciate neurology consult Continue current treatment Increase activity as tolerated    LOS: 5 days   Time spent including chart review, lab review, examination, discussion with patient and family : 40 min   Victoria Cobb  MD  02/17/2019, 9:41 AM

## 2019-02-17 NOTE — Progress Notes (Signed)
Inpatient Rehab Admissions:  Inpatient Rehab Consult received.  I met with pt and her family at the bedside for rehabilitation assessment and to discuss goals and expectations of an inpatient rehab admission. Pt asleep and had just completed PT session. AC spoke with family briefly about program and expectations of a CIR stay. Family confirmed DC support and would like to discuss options for rehab with rest of the family tonight. They plan to have a decision regarding rehab venue preference by tomorrow morning. AC will return and speak with pt directly to gauge her own interest in the program tomorrow once she is more awake.   Please call if questions.   Jhonnie Garner, OTR/L  Rehab Admissions Coordinator  [phone] 02/17/2019 12:49 PM

## 2019-02-17 NOTE — Progress Notes (Addendum)
Reason for consult: Aphasia with AMS  Subjective: Per nursing staff the patient is more alert and today than previously.  She continues to deny pain today.  However, she states that she is not hungry, feels very tired, but generally well overall. Patient's husband and son at bedside to assist with interpretation. They stated that she is definitively not at baseline is at home she fully functions performing all of her own ADLs and caring for 2 children.  ROS: negative except above  Examination  Vital signs in last 24 hours: Temp:  [98.2 F (36.8 C)-101.5 F (38.6 C)] 98.2 F (36.8 C) (07/23 0800) Pulse Rate:  [79-114] 81 (07/23 0800) Resp:  [18-35] 35 (07/23 0800) BP: (85-157)/(41-93) 127/75 (07/23 0800) SpO2:  [93 %-100 %] 99 % (07/23 0800)  General: lying in bed, afebrile, nondiaphoretic  CVS: pulse-normal rate and rhythm RS: breathing comfortably on room air Extremities: normal   Neuro: MS: Alert, oriented to person, general location and admission for fever, follows commands but somewhat somnolent drifting off regularly during evaluation  CN: pupils equal and reactive but constricted,  EOMI, face symmetric, tongue midline, normal sensation over face Motor: Difficult to assess given mental status, appear 4-5/5 strength in all 4 extremities but definitively equal  Reflexes: 2+ bilaterally over patella, and brachioradialis  Coordination: not tested Gait: not tested  Basic Metabolic Panel: Recent Labs  Lab 02/12/19 1857 02/13/19 0629 02/15/19 0632 02/15/19 0920 02/15/19 2127 02/16/19 0135  NA 132* 133* 130* 131* 132* 133*  K 3.9 3.4* 3.7 3.6 3.5 3.1*  CL 96* 97* 97* 97* 95*  --   CO2 22 22 22 23 22   --   GLUCOSE 158* 113* 128* 193* 175*  --   BUN 18 11 7 8 7   --   CREATININE 0.67 0.49 0.56 0.61 0.59  --   CALCIUM 9.3 8.8* 8.7* 8.8* 8.9  --     CBC: Recent Labs  Lab 02/12/19 1857 02/13/19 0629 02/14/19 0903 02/15/19 2127 02/16/19 0135  WBC 8.9 7.0 6.5 12.9*   --   NEUTROABS 6.5  --   --  10.6*  --   HGB 12.4 11.9* 11.7* 12.7 13.3  HCT 39.0 38.7 37.2 41.3 39.0  MCV 86.5 88.0 89.0 86.0  --   PLT 396 360 398 470*  --      Coagulation Studies: No results for input(s): LABPROT, INR in the last 72 hours.  Imaging Reviewed:   ASSESSMENT AND PLAN  Patient Encounter Summary: Victoria Cox is a 58 yo female with a PMHx notable for seronegative RA on chronic immunosuppressives including TNF alpha inhibitors and methotrexate having been recently weaned off of long term corticosteroids who was admitted to the hospital for myalgias, malaise, anorexia and oral ulcers. Initial concern was for serum sickness vs corticosteroid induced adrenal insufficiency. However, it was noted that the patient was initially confused in the ED on the 18th but that resolved until the afternoon of 7/21 when she developed aphasia with persistent utilization of the same unrelated phrase prompting neurology consult. The acute onset aphasia was considered to possibly be 2/2 to a proposed M2 occlusion but further emergent studies were delayed due to the patients inability to lie still and AMS. This prompted intubation for IR angio but the study was negative for the M2 occlusion. She was subsequently extubated successfully and an LP performed due to the AMS with fever. Results of the LP were concerning for possible bacterial/fungal meningitis with viral being less likely. ID  was consulted to assist with management.   Assessment: The patient has improved per report (I did not see her previously). Appears comfortable in her bed and attempts to engage in exam. She denied pain in the head, chest abdomin or extremities. Appears to be improving. Etiology uncertain, although ID appears to be broadening the differential to include tic borne illnesses. Awaiting CSF HSV, varicella, arbovirus, CMV, AFB stain/culture, as thus far a broad etiology remains possible given the protein, WBC's cell count and  unremarkable gram stain. Viral etiology seems most likely given current results and improvement on acyclovir.     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   I have reviewed the contents of history and physical exam as documented by PA/ARNP/Resident and agree with above documentation.  I have discussed and formulated the above plan as documented. Edits to the note have been made as needed.  I am assessing this patient for the first time.  However, compared to her prior exam as documented in the note as well as discussion with the patient family she is a significantly improved.  She is alert and oriented x3, cranial nerves are intact and no significant motor weakness noted. CSF this suggestive of viral infection, high suspicion for HSV.  It appears patient responded to acyclovir.  HSV PCR is still pending.  ID is on board and also added doxycycline as empiric treatment.   MRI brain reveals a small punctate infarct in the corona radiata which is likely incidental.  EEG was negative for epileptiform discharges.  No further recommendations apart from continued antiviral treatment and following the plan CSF studies. Neurology will be available as needed.  Thanks for the consult    Georgiana Spinner  MD [facility] [phone]   If 7pm to 7am, please call on call as listed on AMION.   Georgiana Spinner  [facility] Pager Number [phone] For questions after 7pm please refer to AMION to reach the Neurologist on call

## 2019-02-17 NOTE — Progress Notes (Signed)
Pt tx to 3w30. Assessment stable. Report given to [facility]. Son in Sports coach took pt belongings (clothes) with him to new room

## 2019-02-17 NOTE — Evaluation (Signed)
Speech Language Pathology Evaluation Patient Details Name: Victoria Cox MRN: [id number] DOB: 04/05/1961 Today's Date: 02/17/2019 Time: 2778-2423 SLP Time Calculation (min) (ACUTE ONLY): 22 min  Problem List:  Patient Active Problem List   Diagnosis Date Noted  . Acute respiratory insufficiency   . Acute metabolic encephalopathy   . Hyponatremia   . Stroke (HCC) 02/15/2019  . Generalized weakness 02/12/2019   Past Medical History:  Past Medical History:  Diagnosis Date  . Arthritis   . Diabetes (HCC)   . High blood pressure    Past Surgical History:  Past Surgical History:  Procedure Laterality Date  . IR ANGIO EXTERNAL CAROTID SEL EXT CAROTID UNI R MOD SED  02/16/2019  . IR ANGIO INTRA EXTRACRAN SEL COM CAROTID INNOMINATE BILAT MOD SED  02/16/2019  . IR ANGIO VERTEBRAL SEL VERTEBRAL BILAT MOD SED  02/16/2019  . RADIOLOGY WITH ANESTHESIA N/A 02/15/2019   Procedure: IR WITH ANESTHESIA;  Surgeon: Julieanne Cotton, MD;  Location: [facility] OR;  Service: Radiology;  Laterality: N/A;   HPI:  Pt is a 58 y.o. female with a history of rheumatoid arthritis who presented with a  several week history of myalgias, headache, mouth sores, generalized weakness. She had an episode of worsening confusion and speech disturbance on 02/15/19. MRI of 02/16/19 revealed small focus of acute ischemia within the right corona radiata. EEG showed mild diffuse encephalopath possibly due to underyling metabolic etiology. Spinal tap was completed on 02/16/19.   Assessment / Plan / Recommendation Clinical Impression  Pt was seen for speech/language/cognition evaluation with her cousin-in-law present. Pt's family reported that the pt was independent prior to admission and did not have any deficits in speech, language or cognition. However, he reported that the pt is now confused and inconsistently oriented. [employer] audio [place]jar[place] interpreter, [place], ([place] #[id number]) was used with the pt for translation and interpretation. Pt was  oriented to person during the session and was able to accurately respond to simple yes/no questions but exhibited increased difficulty with complex questions. Pt communicated using short phrases and the pt's family stated that this is due to her being fatigued. Per interpreter, pt's speech was intelligible and she did not demonstrate symptoms of dysarthria.   Pt's participation in the evaluation was reduced and pt's family reported that the pt does not want to interact with individuals today since she is tired and not feeling well. Prior to completion of the evaluation the pt's family indicated that he would like speech pathology services to be initiated tomorrow since he believes that the pt will be much more interactive and alert at that time. Per family's request speech language pathology's evaluation will be completed on subsequent date.     SLP Assessment  SLP Recommendation/Assessment: Patient needs continued Speech Lanaguage Pathology Services SLP Visit Diagnosis: Cognitive communication deficit (R41.841)    Follow Up Recommendations  Inpatient Rehab    Frequency and Duration min 2x/week  2 weeks      SLP Evaluation Cognition  Overall Cognitive Status: Difficult to assess Arousal/Alertness: Lethargic Orientation Level: Oriented to person       Comprehension  Auditory Comprehension Yes/No Questions: Within Functional Limits Basic Biographical Questions: 76-100% accurate Complex Questions: 25-49% accurate    Expression Expression Primary Mode of Expression: Verbal Verbal Expression Overall Verbal Expression: Appears within functional limits for tasks assessed Initiation: No impairment Level of Generative/Spontaneous Verbalization: Phrase;Sentence   Oral / Motor  Motor Speech Overall Motor Speech: Appears within functional limits for tasks assessed Respiration: Within functional limits  Phonation: Normal Resonance: Within functional limits Articulation: Within functional  limitis Intelligibility: Intelligible Motor Planning: Witnin functional limits Motor Speech Errors: Not applicable   Chaysen Tillman I. Hardin Negus, [place], [place] Office number [phone] Pager [place] 02/17/2019, 12:21 PM

## 2019-02-18 ENCOUNTER — Inpatient Hospital Stay (HOSPITAL_COMMUNITY): Payer: Medicaid Other

## 2019-02-18 DIAGNOSIS — Z95828 Presence of other vascular implants and grafts: Secondary | ICD-10-CM

## 2019-02-18 DIAGNOSIS — G0490 Encephalitis and encephalomyelitis, unspecified: Secondary | ICD-10-CM

## 2019-02-18 LAB — CBC WITH DIFFERENTIAL/PLATELET
Abs Immature Granulocytes: 0.04 10*3/uL (ref 0.00–0.07)
Basophils Absolute: 0 10*3/uL (ref 0.0–0.1)
Basophils Relative: 0 %
Eosinophils Absolute: 0 10*3/uL (ref 0.0–0.5)
Eosinophils Relative: 0 %
HCT: 36.7 % (ref 36.0–46.0)
Hemoglobin: 11.7 g/dL — ABNORMAL LOW (ref 12.0–15.0)
Immature Granulocytes: 1 %
Lymphocytes Relative: 31 %
Lymphs Abs: 2.2 10*3/uL (ref 0.7–4.0)
MCH: 27.4 pg (ref 26.0–34.0)
MCHC: 31.9 g/dL (ref 30.0–36.0)
MCV: 85.9 fL (ref 80.0–100.0)
Monocytes Absolute: 1.6 10*3/uL — ABNORMAL HIGH (ref 0.1–1.0)
Monocytes Relative: 23 %
Neutro Abs: 3.1 10*3/uL (ref 1.7–7.7)
Neutrophils Relative %: 45 %
Platelets: 242 10*3/uL (ref 150–400)
RBC: 4.27 MIL/uL (ref 3.87–5.11)
RDW: 14.8 % (ref 11.5–15.5)
WBC: 7 10*3/uL (ref 4.0–10.5)
nRBC: 0 % (ref 0.0–0.2)

## 2019-02-18 LAB — GLUCOSE, CAPILLARY
Glucose-Capillary: 128 mg/dL — ABNORMAL HIGH (ref 70–99)
Glucose-Capillary: 223 mg/dL — ABNORMAL HIGH (ref 70–99)
Glucose-Capillary: 148 mg/dL — ABNORMAL HIGH (ref 70–99)
Glucose-Capillary: 140 mg/dL — ABNORMAL HIGH (ref 70–99)

## 2019-02-18 LAB — COMPREHENSIVE METABOLIC PANEL
ALT: 49 U/L — ABNORMAL HIGH (ref 0–44)
AST: 47 U/L — ABNORMAL HIGH (ref 15–41)
Albumin: 2.7 g/dL — ABNORMAL LOW (ref 3.5–5.0)
Alkaline Phosphatase: 87 U/L (ref 38–126)
Anion gap: 12 (ref 5–15)
BUN: <5 mg/dL — ABNORMAL LOW (ref 6–20)
CO2: 19 mmol/L — ABNORMAL LOW (ref 22–32)
Calcium: 8.4 mg/dL — ABNORMAL LOW (ref 8.9–10.3)
Chloride: 100 mmol/L (ref 98–111)
Creatinine, Ser: 0.74 mg/dL (ref 0.44–1.00)
GFR calc Af Amer: >60 mL/min (ref >60)
GFR calc non Af Amer: >60 mL/min (ref >60)
Glucose, Bld: 155 mg/dL — ABNORMAL HIGH (ref 70–99)
Potassium: 3.3 mmol/L — ABNORMAL LOW (ref 3.5–5.1)
Sodium: 131 mmol/L — ABNORMAL LOW (ref 135–145)
Total Bilirubin: 0.2 mg/dL — ABNORMAL LOW (ref 0.3–1.2)
Total Protein: 6.7 g/dL (ref 6.5–8.1)

## 2019-02-18 MED ORDER — SODIUM CHLORIDE 0.9% FLUSH: 10.0000 mL | INTRAVENOUS | Status: DC | PRN

## 2019-02-18 MED ORDER — GUAIFENESIN 100 MG/5ML PO SOLN: 5.0000 mL | ORAL | Status: DC | PRN

## 2019-02-18 MED ORDER — SODIUM CHLORIDE 0.9% FLUSH
10.0000 mL | Freq: Two times a day (BID) | INTRAVENOUS | Status: DC
  Administered 2019-02-18: 10 mL
  Administered 2019-02-20: 10:00:00 40 mL

## 2019-02-18 NOTE — Progress Notes (Signed)
  Speech Language Pathology Treatment: Cognitive-Linquistic  Patient Details Name: Victoria Cox MRN: [id number] DOB: Nov 15, 1960 Today's Date: 02/18/2019 Time: 4098-1191 SLP Time Calculation (min) (ACUTE ONLY): 34 min  Assessment / Plan / Recommendation Clinical Impression  Pt was seen for completion of language and cognitive-linguistic assessment. [employer] Gujar[place] interpreter, Ankit ([place] [phone]), was used for translation and interpretation. Pt's sister was present for the entirety of the session and the pt's daughter was present at the beginning and ending of the session. Pt's sister was educated regarding the importance of not assisting the pt with cues at the onset of the session and she verbalized understanding regarding this. However, reminders were required throughout the evaluation due to her insistance on providing cues. The impact of cueing on the pt's performance and validity of results is considered. She presented with a mild to moderate cognitive-linguistic impairment related to attention, awareness, working memory, orientation to time, reasoning, and problem solving. Continued skilled SLP services are clinically indicated at this time.      SLP Evaluation Cognition  Overall Cognitive Status: Impaired/Different from baseline Arousal/Alertness: Awake/alert Orientation Level: Oriented to person;Oriented to place;Oriented to situation;Disoriented to time(Year: 2001; Month: July; date: denied; ) Attention: Focused;Sustained Focused Attention: Impaired Focused Attention Impairment: Verbal complex Sustained Attention: Impaired Sustained Attention Impairment: Verbal complex Memory: Appears intact(Immediate: 3/3; delayed: 3/3) Awareness: Impaired Awareness Impairment: Emergent impairment Problem Solving: Impaired Problem Solving Impairment: Verbal complex;Functional basic(1/4) Executive Function: Reasoning Reasoning: Impaired Reasoning Impairment: Verbal complex           HPI  HPI: Pt is a 58 y.o. female with a history of rheumatoid arthritis who presented with a  several week history of myalgias, headache, mouth sores, generalized weakness. She had an episode of worsening confusion and speech disturbance on 02/15/19. MRI of 02/16/19 revealed small focus of acute ischemia within the right corona radiata. EEG showed mild diffuse encephalopath possibly due to underyling metabolic etiology. Spinal tap was completed on 02/16/19.      SLP Plan  Goals updated       Recommendations                   Follow up Recommendations: Inpatient Rehab SLP Visit Diagnosis: Cognitive communication deficit (A21.308) Plan: Goals updated       Lyrik Buresh I. Hardin Negus, [place], [place] Office number [phone] Pager [place] 02/18/2019, 11:51 AM

## 2019-02-18 NOTE — Progress Notes (Signed)
Inpatient Rehabilitation-Admissions Coordinator   [facility] will continue to follow for medical readiness. Noted fever last night and labs pending.   Will follow up with pt Monday.   Jhonnie Garner, OTR/L  Rehab Admissions Coordinator  [phone] 02/18/2019 11:24 AM

## 2019-02-18 NOTE — Progress Notes (Signed)
Ref: [name], MD   Subjective:  Episode of fever last night. Mild swelling of left hand and forearm. New IV site in left upper arm. Oral intake improving per family. Weakness continues.  Objective:  Vital Signs in the last 24 hours: Temp:  [98.1 F (36.7 C)-102.9 F (39.4 C)] 98.8 F (37.1 C) (07/24 0752) Pulse Rate:  [82-100] 90 (07/24 0752) Cardiac Rhythm: Normal sinus rhythm (07/24 0700) Resp:  [13-18] 16 (07/24 0752) BP: (131-170)/(63-80) 147/69 (07/24 0752) SpO2:  [95 %-100 %] 98 % (07/24 0752)  Physical Exam: BP Readings from Last 1 Encounters:  02/18/19 (!) 147/69     Wt Readings from Last 1 Encounters:  02/16/19 90.1 kg    Weight change:  Body mass index is 35.19 kg/m. HEENT: [place]/AT, Eyes-Brown, PERL, EOMI, Conjunctiva-Pink, Sclera-Non-icteric Neck: No JVD, No bruit, Trachea midline. Lungs:  Clear, Bilateral. Cardiac:  Regular rhythm, normal S1 and S2, no S3. II/VI systolic murmur. Abdomen:  Soft, non-tender. BS present. Extremities:  No edema present. No cyanosis. No clubbing. CNS: AxOx3, Cranial nerves grossly intact, moves all 4 extremities.  Skin: Warm and dry.   Intake/Output from previous day: 07/23 0701 - 07/24 0700 In: 1862.2 [P.O.:240; I.V.:787.7; IV Piggyback:834.5] Out: -     Lab Results: BMET    Component Value Date/Time   NA 131 (L) 02/18/2019 0459   NA 133 (L) 02/16/2019 0135   NA 132 (L) 02/15/2019 2127   NA 139 11/30/2014 1001   K 3.3 (L) 02/18/2019 0459   K 3.1 (L) 02/16/2019 0135   K 3.5 02/15/2019 2127   CL 100 02/18/2019 0459   CL 95 (L) 02/15/2019 2127   CL 97 (L) 02/15/2019 0920   CO2 19 (L) 02/18/2019 0459   CO2 22 02/15/2019 2127   CO2 23 02/15/2019 0920   GLUCOSE 155 (H) 02/18/2019 0459   GLUCOSE 175 (H) 02/15/2019 2127   GLUCOSE 193 (H) 02/15/2019 0920   BUN <5 (L) 02/18/2019 0459   BUN 7 02/15/2019 2127   BUN 8 02/15/2019 0920   BUN 11 11/30/2014 1001   CREATININE 0.74 02/18/2019 0459   CREATININE 0.59  02/15/2019 2127   CREATININE 0.61 02/15/2019 0920   CALCIUM 8.4 (L) 02/18/2019 0459   CALCIUM 8.9 02/15/2019 2127   CALCIUM 8.8 (L) 02/15/2019 0920   GFRNONAA >60 02/18/2019 0459   GFRNONAA >60 02/15/2019 2127   GFRNONAA >60 02/15/2019 0920   GFRAA >60 02/18/2019 0459   GFRAA >60 02/15/2019 2127   GFRAA >60 02/15/2019 0920   CBC    Component Value Date/Time   WBC 7.0 02/18/2019 0459   RBC 4.27 02/18/2019 0459   HGB 11.7 (L) 02/18/2019 0459   HGB 12.5 11/30/2014 1001   HCT 36.7 02/18/2019 0459   HCT 38.6 11/30/2014 1001   PLT 242 02/18/2019 0459   PLT 369 11/30/2014 1001   MCV 85.9 02/18/2019 0459   MCV 83 11/30/2014 1001   MCH 27.4 02/18/2019 0459   MCHC 31.9 02/18/2019 0459   RDW 14.8 02/18/2019 0459   RDW 14.1 11/30/2014 1001   LYMPHSABS 2.2 02/18/2019 0459   LYMPHSABS 2.2 11/30/2014 1001   MONOABS 1.6 (H) 02/18/2019 0459   EOSABS 0.0 02/18/2019 0459   EOSABS 0.2 11/30/2014 1001   BASOSABS 0.0 02/18/2019 0459   BASOSABS 0.0 11/30/2014 1001   HEPATIC Function Panel Recent Labs    02/12/19 1857 02/18/19 0459  PROT 8.1 6.7   HEMOGLOBIN A1C No components found for: HGA1C,  MPG CARDIAC ENZYMES  Lab Results  Component Value Date   CKTOTAL 18 (L) 02/14/2019   CKMB 0.9 02/14/2019   BNP No results for input(s): PROBNP in the last 8760 hours. TSH Recent Labs    02/13/19 0629  TSH 0.367   CHOLESTEROL Recent Labs    02/16/19 0639  CHOL 167    Scheduled Meds: .  stroke: mapping our early stages of recovery book   Does not apply Once  . amLODipine  5 mg Oral Daily  . aspirin  300 mg Rectal Daily   Or  . aspirin  325 mg Oral Daily  . Chlorhexidine Gluconate Cloth  6 each Topical Daily  . clopidogrel  75 mg Oral Daily  . enoxaparin (LOVENOX) injection  40 mg Subcutaneous QHS  . feeding supplement  1 Container Oral TID BM  . folic acid  2 mg Oral Daily  . insulin aspart  0-9 Units Subcutaneous TID WC  . irbesartan  75 mg Oral Daily  . levothyroxine  75  mcg Oral Q0600  . methotrexate  20 mg Oral Weekly  . metoprolol succinate  50 mg Oral Daily  . multivitamin with minerals  1 tablet Oral Daily  . ondansetron (ZOFRAN) IV  4 mg Intravenous TID AC  . pantoprazole (PROTONIX) IV  40 mg Intravenous Q24H  . senna-docusate  1 tablet Oral Once   Continuous Infusions: . 0.9 % NaCl with KCl 20 mEq / L 50 mL/hr at 02/17/19 1642  . doxycycline (VIBRAMYCIN) IV 100 mg (02/17/19 2314)  . niCARDipine Stopped (02/16/19 0209)   PRN Meds:.acetaminophen, Muscle Rub  Assessment/Plan: Possible viral meningitis/encephalitis Recent or remote lacunar infarct of right corona radiata Fever Generalized weakness Type 2 DM Obesity Rheumatoid arthritis  Continue potassium in IV but decrease rate. Continue antibiotic and antiviral therapy per Infectious disease. Encourage oral intake. Encourage increase in activity as tolerated.   LOS: 6 days   Time spent including chart review, lab review, examination, discussion with patient :  min   Dixie Dials  MD  02/18/2019, 11:27 AM

## 2019-02-18 NOTE — Progress Notes (Signed)
[facility]   Reason for visit: Follow up on encephalopathy  Interval History: no new positive results.  HSV negative and off of acyclovir.  RMSF pending and still on doxy.  Fever to 102.9.  Daughter at bedside reports she is feeling better overall.  Getting midline placed now.   Physical Exam: Constitutional:  Vitals:   02/18/19 1239 02/18/19 1605  BP: (!) 121/49 120/67  Pulse: 95 90  Resp: 16 16  Temp: 99.4 F (37.4 C) 98.6 F (37 C)  SpO2: 98% 97%   patient appears in NAD Respiratory: Normal respiratory effort; CTA B Cardiovascular: RRR GI: soft, nt, nd  Review of Systems: Constitutional: negative for fevers and chills Gastrointestinal: negative for nausea and diarrhea  Lab Results  Component Value Date   WBC 7.0 02/18/2019   HGB 11.7 (L) 02/18/2019   HCT 36.7 02/18/2019   MCV 85.9 02/18/2019   PLT 242 02/18/2019    Lab Results  Component Value Date   CREATININE 0.74 02/18/2019   BUN <5 (L) 02/18/2019   NA 131 (L) 02/18/2019   K 3.3 (L) 02/18/2019   CL 100 02/18/2019   CO2 19 (L) 02/18/2019    Lab Results  Component Value Date   ALT 49 (H) 02/18/2019   AST 47 (H) 02/18/2019   ALKPHOS 87 02/18/2019     Microbiology: Recent Results (from the past 240 hour(s))  SARS Coronavirus 2 (CEPHEID - Performed in [facility] hospital lab), Hosp Order     Status: None   Collection Time: 02/12/19  6:28 PM   Specimen: Nasopharyngeal Swab  Result Value Ref Range Status   SARS Coronavirus 2 NEGATIVE NEGATIVE Final    Comment: (NOTE) If result is NEGATIVE SARS-CoV-2 target nucleic acids are NOT DETECTED. The SARS-CoV-2 RNA is generally detectable in upper and lower  respiratory specimens during the acute phase of infection. The lowest  concentration of SARS-CoV-2 viral copies this assay can detect is 250  copies / mL. A negative result does not preclude SARS-CoV-2 infection  and should not be used as the sole basis for treatment or other   patient management decisions.  A negative result may occur with  improper specimen collection / handling, submission of specimen other  than nasopharyngeal swab, presence of viral mutation(s) within the  areas targeted by this assay, and inadequate number of viral copies  (<250 copies / mL). A negative result must be combined with clinical  observations, patient history, and epidemiological information. If result is POSITIVE SARS-CoV-2 target nucleic acids are DETECTED. The SARS-CoV-2 RNA is generally detectable in upper and lower  respiratory specimens dur ing the acute phase of infection.  Positive  results are indicative of active infection with SARS-CoV-2.  Clinical  correlation with patient history and other diagnostic information is  necessary to determine patient infection status.  Positive results do  not rule out bacterial infection or co-infection with other viruses. If result is PRESUMPTIVE POSTIVE SARS-CoV-2 nucleic acids MAY BE PRESENT.   A presumptive positive result was obtained on the submitted specimen  and confirmed on repeat testing.  While 2019 novel coronavirus  (SARS-CoV-2) nucleic acids may be present in the submitted sample  additional confirmatory testing may be necessary for epidemiological  and / or clinical management purposes  to differentiate between  SARS-CoV-2 and other Sarbecovirus currently known to infect humans.  If clinically indicated additional testing with an alternate test  methodology [phone]) is advised. The SARS-CoV-2 RNA is generally  detectable in upper and lower respiratory sp ecimens during the acute  phase of infection. The expected result is Negative. Fact Sheet for Patients:  BoilerBrush.com.cy Fact Sheet for Healthcare Providers: https://pope.com/ This test is not yet approved or cleared by the [place] FDA and has been authorized for detection and/or diagnosis of SARS-CoV-2 by  FDA under an Emergency Use Authorization (EUA).  This EUA will remain in effect (meaning this test can be used) for the duration of the COVID-19 declaration under Section 564(b)(1) of the Act, 21 U.S.C. section 360bbb-3(b)(1), unless the authorization is terminated or revoked sooner. Performed at [facility], [address] [place], [place] [address]   Culture, blood (routine x 2)     Status: None   Collection Time: 02/12/19  6:57 PM   Specimen: BLOOD LEFT HAND  Result Value Ref Range Status   Specimen Description   Final    BLOOD LEFT HAND Performed at [facility], [address], [place], [place] [address]    Special Requests   Final    BOTTLES DRAWN AEROBIC AND ANAEROBIC Blood Culture results may not be optimal due to an excessive volume of blood received in culture bottles Performed at [facility], [address] [place], [place] [address]    Culture   Final    NO GROWTH 5 DAYS Performed at [facility], [address], [place], [place] [address]    Report Status 02/17/2019 FINAL  Final  Culture, blood (routine x 2)     Status: None   Collection Time: 02/12/19  6:57 PM   Specimen: BLOOD RIGHT HAND  Result Value Ref Range Status   Specimen Description   Final    BLOOD RIGHT HAND Performed at [facility], [address], [place], [place] [address]    Special Requests   Final    BOTTLES DRAWN AEROBIC AND ANAEROBIC Blood Culture adequate volume Performed at [facility], [address] [place], [place] [address]    Culture   Final    NO GROWTH 5 DAYS Performed at [facility], [address] [place], [place] [address]    Report Status 02/17/2019 FINAL  Final  Urine culture     Status: None   Collection Time: 02/13/19  1:31 AM   Specimen: Urine, Random  Result Value Ref Range Status   Specimen Description   Final    URINE, RANDOM Performed at [facility], [address] [place], [place] [address]    Special Requests   Final    NONE Performed at [facility], [address] [place], [place] [address]    Culture   Final    NO GROWTH Performed at [facility], [address], [place], [place] [address]    Report Status 02/14/2019 FINAL  Final  MRSA PCR Screening     Status: None   Collection Time: 02/16/19 12:40 AM   Specimen: Nasal Mucosa; Nasopharyngeal  Result Value Ref Range Status   MRSA by PCR NEGATIVE NEGATIVE Final    Comment:        The GeneXpert MRSA Assay (FDA approved for NASAL specimens only), is one component of a comprehensive MRSA colonization surveillance program. It is not intended to diagnose MRSA infection nor to guide or monitor treatment for MRSA infections. Performed at [facility], [address], [place], [place] [address]   CSF culture with  Stat gram stain     Status: None (Preliminary result)   Collection Time: 02/16/19  1:39 PM   Specimen: CSF; Cerebrospinal Fluid  Result Value Ref Range Status   Specimen Description CSF  Final   Special Requests Immunocompromised  Final   Gram Stain   Final    WBC PRESENT,BOTH PMN AND MONONUCLEAR NO ORGANISMS SEEN CYTOSPIN SMEAR    Culture   Final    NO GROWTH 2 DAYS Performed at [facility], [address] [place], [place] [address]    Report Status PENDING  Incomplete  Culture, fungus without smear     Status: None (Preliminary result)   Collection Time: 02/16/19  1:39 PM   Specimen: CSF; Other  Result Value Ref Range Status   Specimen Description CSF  Final   Special Requests Immunocompromised  Final   Culture   Final    NO FUNGUS ISOLATED AFTER 2 DAYS Performed at [facility], [address], [place], [place] [address]    Report Status PENDING  Incomplete  Acid Fast Smear (AFB)     Status: None   Collection Time: 02/16/19  1:39 PM   Specimen: Cerebrospinal Fluid  Result Value  Ref Range Status   AFB Specimen Processing Concentration  Final   Acid Fast Smear Negative  Final    Comment: (NOTE) Performed At: [facility] [place], [place] [id number] Rush Farmer MD [id number]    Source (AFB) CSF  Final    Comment: Performed at [facility], [place] [address] [place], [place] [address]    Impression/Plan:  1. Encephalitis - high WBCs in CSF.  No findings to date.  Slow improvement.  Likely viral etiology.   2. Fever - as above.  Will continue to monitor.

## 2019-02-18 NOTE — Progress Notes (Signed)
Physical Therapy Treatment Patient Details Name: Victoria Cox MRN: [id number] DOB: 01-13-1961 Today's Date: 02/18/2019    History of Present Illness 58 y.o. female with a history of rheumatoid arthritis who presents with a several week history of myalgias, headache, mouth sores, generalized weakness. She had an episode of worsening confusion and Speech disturbance on 02/15/2019. 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 EEG shows diffuse slowing and triphasics. Pt also now s/p spinal tap on 7/22    PT Comments    Patient seen for mobility progression. Pt is making progress toward PT goals and requires mod A +2 for gait training this session. Continue to recommend CIR for further skilled PT services to maximize independence and safety with mobility.     Follow Up Recommendations  CIR;Supervision/Assistance - 24 hour     Equipment Recommendations  Other (comment)(TBA)    Recommendations for Other Services       Precautions / Restrictions Precautions Precautions: Fall Restrictions Weight Bearing Restrictions: No    Mobility  Bed Mobility Overal bed mobility: Needs Assistance Bed Mobility: Supine to Sit     Supine to sit: Min guard;HOB elevated     General bed mobility comments: min guard for safety and use of rails  Transfers Overall transfer level: Needs assistance Equipment used: 2 person hand held assist Transfers: Sit to/from Stand Sit to Stand: Min assist;+2 safety/equipment         General transfer comment: assist to power up into standing and for balance upon standing  Ambulation/Gait Ambulation/Gait assistance: Mod assist;+2 safety/equipment Gait Distance (Feet): 90 Feet Assistive device: Rolling walker (2 wheeled) Gait Pattern/deviations: Step-through pattern;Decreased step length - right;Decreased step length - left;Decreased weight shift to left;Drifts right/left     General Gait Details: pt with L lateral bias and unable to manage RW without  assistance; assist for balance and directional cues for navigating environment; pt running into objects on L side    Stairs             Wheelchair Mobility    Modified Rankin (Stroke Patients Only) Modified Rankin (Stroke Patients Only) Pre-Morbid Rankin Score: No symptoms Modified Rankin: Moderately severe disability     Balance Overall balance assessment: Needs assistance Sitting-balance support: No upper extremity supported;Feet supported Sitting balance-Leahy Scale: Fair     Standing balance support: Bilateral upper extremity supported Standing balance-Leahy Scale: Poor                              Cognition Arousal/Alertness: Lethargic(awake when engaged in task and then falls back to sleep) Behavior During Therapy: Flat affect;Impulsive Overall Cognitive Status: Impaired/Different from baseline Area of Impairment: Safety/judgement                         Safety/Judgement: Decreased awareness of safety;Decreased awareness of deficits            Exercises      General Comments        Pertinent Vitals/Pain Pain Assessment: Faces Faces Pain Scale: Hurts little more Pain Location: back of neck Pain Descriptors / Indicators: Guarding(pt rubbing back of neck) Pain Intervention(s): Limited activity within patient's tolerance;Monitored during session;Repositioned;Heat applied    Home Living                      Prior Function            PT Goals (current goals can  now be found in the care plan section) Acute Rehab PT Goals Patient Stated Goal: none stated Progress towards PT goals: Progressing toward goals    Frequency    Min 4X/week      PT Plan Current plan remains appropriate    Co-evaluation PT/OT/SLP Co-Evaluation/Treatment: Yes Reason for Co-Treatment: Necessary to address cognition/behavior during functional activity;For patient/therapist safety;To address functional/ADL transfers PT goals addressed  during session: Mobility/safety with mobility;Balance OT goals addressed during session: ADL's and self-care;Strengthening/ROM      AM-PAC PT "6 Clicks" Mobility   Outcome Measure  Help needed turning from your back to your side while in a flat bed without using bedrails?: A Little Help needed moving from lying on your back to sitting on the side of a flat bed without using bedrails?: A Little Help needed moving to and from a bed to a chair (including a wheelchair)?: A Little Help needed standing up from a chair using your arms (e.g., wheelchair or bedside chair)?: A Little Help needed to walk in hospital room?: A Little Help needed climbing 3-5 steps with a railing? : A Lot 6 Click Score: 17    End of Session Equipment Utilized During Treatment: Gait belt Activity Tolerance: Patient tolerated treatment well Patient left: in chair;with call bell/phone within reach;with chair alarm set;with family/visitor present Nurse Communication: Mobility status PT Visit Diagnosis: Other abnormalities of gait and mobility (R26.89)     Time: 1007-1040 PT Time Calculation (min) (ACUTE ONLY): 33 min  Charges:  $Gait Training: 8-22 mins                     Earney Navy, PTA Acute Rehabilitation Services Pager: [phone] Office: [phone]     Darliss Cheney 02/18/2019, 1:53 PM

## 2019-02-18 NOTE — Plan of Care (Signed)
Progressing

## 2019-02-18 NOTE — Progress Notes (Addendum)
Occupational Therapy Treatment Patient Details Name: Victoria Cox MRN: [id number] DOB: 1961-01-07 Today's Date: 02/18/2019    History of present illness 58 y.o. female with a history of rheumatoid arthritis who presents with a several week history of myalgias, headache, mouth sores, generalized weakness. She had an episode of worsening confusion and Speech disturbance on 02/15/2019. 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 EEG shows diffuse slowing and triphasics. Pt also now s/p spinal tap on 7/22   OT comments  This 58 yo female admitted with above seen in conjunction with PT. Pt doing better today over eval from 2 days ago with pt being min A-Mod A +2 safety with pt still tending to be lethargic with eyes closed if not engaged in activity. She will continue to benefit from acute OT with follow up on CIR to work back towards PLOF. Pt's LUE edematous (also arm that IV is in)--called unit secretary to let RN know it needed to be checked.  Follow Up Recommendations  CIR;Supervision/Assistance - 24 hour    Equipment Recommendations  Other (comment)(TBD next venue)    Recommendations for Other Services Rehab consult    Precautions / Restrictions Precautions Precautions: Fall Restrictions Weight Bearing Restrictions: No       Mobility Bed Mobility Overal bed mobility: Needs Assistance Bed Mobility: Supine to Sit     Supine to sit: Min guard;HOB elevated     General bed mobility comments: use of rail as well  Transfers Overall transfer level: Needs assistance Equipment used: 2 person hand held assist Transfers: Sit to/from Stand Sit to Stand: Min assist         General transfer comment: Ambulating pt was Mod A +2 (safety and lines) with lean to left with use of RW    Balance Overall balance assessment: Needs assistance Sitting-balance support: No upper extremity supported;Feet supported Sitting balance-Leahy Scale: Fair     Standing balance support: Bilateral upper  extremity supported Standing balance-Leahy Scale: Poor                             ADL either performed or assessed with clinical judgement   ADL Overall ADL's : Needs assistance/impaired     Grooming: Wash/dry hands;Wash/dry face;Oral care;Minimal assistance;Standing Grooming Details (indicate cue type and reason): at sink                 Toilet Transfer: Minimal assistance;Ambulation;Regular Toilet;Grab bars   Toileting- Clothing Manipulation and Hygiene: Minimal assistance;Sit to/from stand               Vision   Vision Assessment?: Vision impaired- to be further tested in functional context Additional Comments: Pt with tendency to keep eyes closed if not engaged in activity. Would have run into wall-a-roo on left if not cued          Cognition Arousal/Alertness: Lethargic Behavior During Therapy: Flat affect Overall Cognitive Status: Impaired/Different from baseline                                                     Pertinent Vitals/ Pain       Pain Assessment: Faces Faces Pain Scale: Hurts little more Pain Location: back of neck Pain Descriptors / Indicators: Guarding(pt rubbing back of neck) Pain Intervention(s): Monitored during session;Repositioned;Heat applied  Frequency  Min 2X/week        Progress Toward Goals  OT Goals(current goals can now be found in the care plan section)  Progress towards OT goals: Progressing toward goals     Plan Discharge plan remains appropriate    Co-evaluation    PT/OT/SLP Co-Evaluation/Treatment: Yes Reason for Co-Treatment: Necessary to address cognition/behavior during functional activity;For patient/therapist safety;To address functional/ADL transfers PT goals addressed during session: Mobility/safety with mobility;Proper use of DME OT goals addressed during session: ADL's and self-care;Strengthening/ROM      AM-PAC OT "6 Clicks" Daily Activity     Outcome  Measure   Help from another person eating meals?: A Little Help from another person taking care of personal grooming?: A Little Help from another person toileting, which includes using toliet, bedpan, or urinal?: A Little Help from another person bathing (including washing, rinsing, drying)?: A Little Help from another person to put on and taking off regular upper body clothing?: A Little Help from another person to put on and taking off regular lower body clothing?: A Little 6 Click Score: 18    End of Session Equipment Utilized During Treatment: Gait belt;Rolling walker  OT Visit Diagnosis: Unsteadiness on feet (R26.81);Other abnormalities of gait and mobility (R26.89);Muscle weakness (generalized) (M62.81);Other symptoms and signs involving cognitive function(vision disturbance)   Activity Tolerance Patient limited by lethargy   Patient Left in chair;with call bell/phone within reach;with chair alarm set;with family/visitor present   Nurse Communication (call desk to ask them to get RN to look at pt's IV in LUE)        Time: 1007-1040 OT Time Calculation (min): 33 min  Charges: OT General Charges $OT Visit: 1 Visit OT Treatments $Self Care/Home Management : 8-22 mins [place], OTR/L Acute [employer] Pager [phone] Office [phone]      Almon Register 02/18/2019, 12:22 PM

## 2019-02-19 DIAGNOSIS — M542 Cervicalgia: Secondary | ICD-10-CM

## 2019-02-19 DIAGNOSIS — R05 Cough: Secondary | ICD-10-CM

## 2019-02-19 LAB — ROCKY MTN SPOTTED FVR ABS PNL(IGG+IGM)
RMSF IgG: NEGATIVE
RMSF IgM: 0.82 index (ref 0.00–0.89)

## 2019-02-19 LAB — LYME DISEASE, WESTERN BLOT
IgG P18 Ab.: ABSENT
IgG P23 Ab.: ABSENT
IgG P28 Ab.: ABSENT
IgG P30 Ab.: ABSENT
IgG P39 Ab.: ABSENT
IgG P41 Ab.: ABSENT
IgG P45 Ab.: ABSENT
IgG P58 Ab.: ABSENT
IgG P66 Ab.: ABSENT
IgG P93 Ab.: ABSENT
IgM P23 Ab.: ABSENT
IgM P39 Ab.: ABSENT
IgM P41 Ab.: ABSENT
Lyme IgG Wb: NEGATIVE
Lyme IgM Wb: NEGATIVE

## 2019-02-19 LAB — CSF CULTURE W GRAM STAIN: Culture: NO GROWTH

## 2019-02-19 LAB — GLUCOSE, CAPILLARY
Glucose-Capillary: 144 mg/dL — ABNORMAL HIGH (ref 70–99)
Glucose-Capillary: 159 mg/dL — ABNORMAL HIGH (ref 70–99)
Glucose-Capillary: 145 mg/dL — ABNORMAL HIGH (ref 70–99)
Glucose-Capillary: 146 mg/dL — ABNORMAL HIGH (ref 70–99)

## 2019-02-19 LAB — CREATININE, SERUM
Creatinine, Ser: 0.6 mg/dL (ref 0.44–1.00)
GFR calc Af Amer: >60 mL/min (ref >60)
GFR calc non Af Amer: >60 mL/min (ref >60)

## 2019-02-19 MED ORDER — AMLODIPINE BESYLATE 5 MG PO TABS
5.0000 mg | ORAL_TABLET | Freq: Every day | ORAL | Status: DC
  Administered 2019-02-19 – 2019-02-20 (×2): 5 mg via ORAL
  Filled 2019-02-19 (×2): qty 1

## 2019-02-19 MED ORDER — GUAIFENESIN 100 MG/5ML PO SOLN
5.0000 mL | ORAL | Status: DC
  Administered 2019-02-19 – 2019-02-20 (×3): 100 mg via ORAL
  Filled 2019-02-19 (×3): qty 5

## 2019-02-19 MED ORDER — PANTOPRAZOLE SODIUM 40 MG PO TBEC
40.0000 mg | DELAYED_RELEASE_TABLET | Freq: Every day | ORAL | Status: DC
  Administered 2019-02-20 – 2019-02-21 (×2): 40 mg via ORAL
  Filled 2019-02-19 (×2): qty 1

## 2019-02-19 NOTE — Progress Notes (Addendum)
Patient assisted out of bed to the bathroom; slightly unsteady gait; up with assistance only; upon return to the bed; patient given an IS to practice deep breathing; patient able to demonstrate use of the IS;encouraged to cough and deep breath and utilize the IS a few times an hour; patient does not want to get out of bed to the chair or do her bath at this time.

## 2019-02-19 NOTE — Progress Notes (Signed)
Ref: [name], MD   Subjective:  VS stable except T max 99.8 degree F. Chest x-ray unremarkable. Cough persist. Weakness persist. Oral intake improving. Off Acyclovir and doxycycline per infectious disease as cultures are negative so far.  Objective:  Vital Signs in the last 24 hours: Temp:  [98.1 F (36.7 C)-99.8 F (37.7 C)] 99.2 F (37.3 C) (07/25 1153) Pulse Rate:  [74-95] 76 (07/25 1153) Cardiac Rhythm: Normal sinus rhythm (07/25 0700) Resp:  [16-18] 18 (07/25 0751) BP: (120-144)/(49-77) 142/77 (07/25 1153) SpO2:  [95 %-100 %] 97 % (07/25 1153)  Physical Exam: BP Readings from Last 1 Encounters:  02/19/19 (!) 142/77     Wt Readings from Last 1 Encounters:  02/16/19 90.1 kg    Weight change:  Body mass index is 35.19 kg/m. HEENT: [place]/AT, Eyes-Brown, PERL, EOMI, Conjunctiva-Pink, Sclera-Non-icteric Neck: No JVD, No bruit, Trachea midline. Lungs:  Clearing, Bilateral. Cardiac:  Regular rhythm, normal S1 and S2, no S3. II/VI systolic murmur. Abdomen:  Soft, non-tender. BS present. Extremities:  No edema present. No cyanosis. No clubbing. Left hand swelling from IV infiltration is resolving. Right elbow mild ecchymosis below IV site. CNS: AxOx3, Cranial nerves grossly intact, moves all 4 extremities.  Skin: Warm and dry.   Intake/Output from previous day: No intake/output data recorded.    Lab Results: BMET    Component Value Date/Time   NA 131 (L) 02/18/2019 0459   NA 133 (L) 02/16/2019 0135   NA 132 (L) 02/15/2019 2127   NA 139 11/30/2014 1001   K 3.3 (L) 02/18/2019 0459   K 3.1 (L) 02/16/2019 0135   K 3.5 02/15/2019 2127   CL 100 02/18/2019 0459   CL 95 (L) 02/15/2019 2127   CL 97 (L) 02/15/2019 0920   CO2 19 (L) 02/18/2019 0459   CO2 22 02/15/2019 2127   CO2 23 02/15/2019 0920   GLUCOSE 155 (H) 02/18/2019 0459   GLUCOSE 175 (H) 02/15/2019 2127   GLUCOSE 193 (H) 02/15/2019 0920   BUN <5 (L) 02/18/2019 0459   BUN 7 02/15/2019 2127   BUN 8  02/15/2019 0920   BUN 11 11/30/2014 1001   CREATININE 0.60 02/19/2019 0336   CREATININE 0.74 02/18/2019 0459   CREATININE 0.59 02/15/2019 2127   CALCIUM 8.4 (L) 02/18/2019 0459   CALCIUM 8.9 02/15/2019 2127   CALCIUM 8.8 (L) 02/15/2019 0920   GFRNONAA >60 02/19/2019 0336   GFRNONAA >60 02/18/2019 0459   GFRNONAA >60 02/15/2019 2127   GFRAA >60 02/19/2019 0336   GFRAA >60 02/18/2019 0459   GFRAA >60 02/15/2019 2127   CBC    Component Value Date/Time   WBC 7.0 02/18/2019 0459   RBC 4.27 02/18/2019 0459   HGB 11.7 (L) 02/18/2019 0459   HGB 12.5 11/30/2014 1001   HCT 36.7 02/18/2019 0459   HCT 38.6 11/30/2014 1001   PLT 242 02/18/2019 0459   PLT 369 11/30/2014 1001   MCV 85.9 02/18/2019 0459   MCV 83 11/30/2014 1001   MCH 27.4 02/18/2019 0459   MCHC 31.9 02/18/2019 0459   RDW 14.8 02/18/2019 0459   RDW 14.1 11/30/2014 1001   LYMPHSABS 2.2 02/18/2019 0459   LYMPHSABS 2.2 11/30/2014 1001   MONOABS 1.6 (H) 02/18/2019 0459   EOSABS 0.0 02/18/2019 0459   EOSABS 0.2 11/30/2014 1001   BASOSABS 0.0 02/18/2019 0459   BASOSABS 0.0 11/30/2014 1001   HEPATIC Function Panel Recent Labs    02/12/19 1857 02/18/19 0459  PROT 8.1 6.7   HEMOGLOBIN  A1C No components found for: HGA1C,  MPG CARDIAC ENZYMES Lab Results  Component Value Date   CKTOTAL 18 (L) 02/14/2019   CKMB 0.9 02/14/2019   BNP No results for input(s): PROBNP in the last 8760 hours. TSH Recent Labs    02/13/19 0629  TSH 0.367   CHOLESTEROL Recent Labs    02/16/19 0639  CHOL 167    Scheduled Meds: .  stroke: mapping our early stages of recovery book   Does not apply Once  . amLODipine  5 mg Oral QHS  . aspirin  300 mg Rectal Daily   Or  . aspirin  325 mg Oral Daily  . clopidogrel  75 mg Oral Daily  . enoxaparin (LOVENOX) injection  40 mg Subcutaneous QHS  . feeding supplement  1 Container Oral TID BM  . folic acid  2 mg Oral Daily  . guaiFENesin  5 mL Oral Q4H while awake  . insulin aspart  0-9  Units Subcutaneous TID WC  . irbesartan  75 mg Oral Daily  . levothyroxine  75 mcg Oral Q0600  . methotrexate  20 mg Oral Weekly  . metoprolol succinate  50 mg Oral Daily  . multivitamin with minerals  1 tablet Oral Daily  . ondansetron (ZOFRAN) IV  4 mg Intravenous TID AC  . [START ON 02/20/2019] pantoprazole  40 mg Oral Daily  . sodium chloride flush  10-40 mL Intracatheter Q12H   Continuous Infusions: . niCARDipine Stopped (02/16/19 0209)   PRN Meds:.acetaminophen, Muscle Rub, sodium chloride flush  Assessment/Plan: Possible viral encephalitis Recent or remote lacunar infarct of right corona radiata Fever Generalized weakness Type 2 DM Obesity Rheumatoid arthritis Hypokalemia  Increase oral intake. Increase activity.   LOS: 7 days   Time spent including chart review, lab review, examination, discussion with patient :  min   Dixie Dials  MD  02/19/2019, 12:11 PM

## 2019-02-19 NOTE — Progress Notes (Signed)
[facility]   Reason for visit: Follow up on encephalopathy  Interval History: no new positive results.  HSV negative and off of acyclovir.  RMSF negative.  Son at bedside and she is slowly improving.  Some pain at lower cervical area.  No fever.    Physical Exam: Constitutional:  Vitals:   02/19/19 0751 02/19/19 1153  BP: 138/73 (!) 142/77  Pulse: 74 76  Resp: 18   Temp: 98.7 F (37.1 C) 99.2 F (37.3 C)  SpO2: 100% 97%   patient appears in NAD Respiratory: Normal respiratory effort; CTA B Cardiovascular: RRR GI: soft, nt, nd MS: back of neck with no warmth, no swelling. Some tenderness.   Review of Systems: Constitutional: negative for fevers and chills Gastrointestinal: negative for nausea and diarrhea  Lab Results  Component Value Date   WBC 7.0 02/18/2019   HGB 11.7 (L) 02/18/2019   HCT 36.7 02/18/2019   MCV 85.9 02/18/2019   PLT 242 02/18/2019    Lab Results  Component Value Date   CREATININE 0.60 02/19/2019   BUN <5 (L) 02/18/2019   NA 131 (L) 02/18/2019   K 3.3 (L) 02/18/2019   CL 100 02/18/2019   CO2 19 (L) 02/18/2019    Lab Results  Component Value Date   ALT 49 (H) 02/18/2019   AST 47 (H) 02/18/2019   ALKPHOS 87 02/18/2019     Microbiology: Recent Results (from the past 240 hour(s))  SARS Coronavirus 2 (CEPHEID - Performed in [facility] hospital lab), Hosp Order     Status: None   Collection Time: 02/12/19  6:28 PM   Specimen: Nasopharyngeal Swab  Result Value Ref Range Status   SARS Coronavirus 2 NEGATIVE NEGATIVE Final    Comment: (NOTE) If result is NEGATIVE SARS-CoV-2 target nucleic acids are NOT DETECTED. The SARS-CoV-2 RNA is generally detectable in upper and lower  respiratory specimens during the acute phase of infection. The lowest  concentration of SARS-CoV-2 viral copies this assay can detect is 250  copies / mL. A negative result does not preclude SARS-CoV-2 infection  and should not be used as  the sole basis for treatment or other  patient management decisions.  A negative result may occur with  improper specimen collection / handling, submission of specimen other  than nasopharyngeal swab, presence of viral mutation(s) within the  areas targeted by this assay, and inadequate number of viral copies  (<250 copies / mL). A negative result must be combined with clinical  observations, patient history, and epidemiological information. If result is POSITIVE SARS-CoV-2 target nucleic acids are DETECTED. The SARS-CoV-2 RNA is generally detectable in upper and lower  respiratory specimens dur ing the acute phase of infection.  Positive  results are indicative of active infection with SARS-CoV-2.  Clinical  correlation with patient history and other diagnostic information is  necessary to determine patient infection status.  Positive results do  not rule out bacterial infection or co-infection with other viruses. If result is PRESUMPTIVE POSTIVE SARS-CoV-2 nucleic acids MAY BE PRESENT.   A presumptive positive result was obtained on the submitted specimen  and confirmed on repeat testing.  While 2019 novel coronavirus  (SARS-CoV-2) nucleic acids may be present in the submitted sample  additional confirmatory testing may be necessary for epidemiological  and / or clinical management purposes  to differentiate between  SARS-CoV-2 and other Sarbecovirus currently known to infect humans.  If clinically indicated additional testing with an alternate test  methodology [phone]) is advised. The SARS-CoV-2 RNA is generally  detectable in upper and lower respiratory sp ecimens during the acute  phase of infection. The expected result is Negative. Fact Sheet for Patients:  BoilerBrush.com.cy Fact Sheet for Healthcare Providers: https://pope.com/ This test is not yet approved or cleared by the [place] FDA and has been authorized for  detection and/or diagnosis of SARS-CoV-2 by FDA under an Emergency Use Authorization (EUA).  This EUA will remain in effect (meaning this test can be used) for the duration of the COVID-19 declaration under Section 564(b)(1) of the Act, 21 U.S.C. section 360bbb-3(b)(1), unless the authorization is terminated or revoked sooner. Performed at [facility], [address], [place], [place] [address]   Culture, blood (routine x 2)     Status: None   Collection Time: 02/12/19  6:57 PM   Specimen: BLOOD LEFT HAND  Result Value Ref Range Status   Specimen Description   Final    BLOOD LEFT HAND Performed at [facility], [address] [place], [place] [address]    Special Requests   Final    BOTTLES DRAWN AEROBIC AND ANAEROBIC Blood Culture results may not be optimal due to an excessive volume of blood received in culture bottles Performed at [facility], [address] [place], [place] [address]    Culture   Final    NO GROWTH 5 DAYS Performed at [facility], [address] [place], [place] [address]    Report Status 02/17/2019 FINAL  Final  Culture, blood (routine x 2)     Status: None   Collection Time: 02/12/19  6:57 PM   Specimen: BLOOD RIGHT HAND  Result Value Ref Range Status   Specimen Description   Final    BLOOD RIGHT HAND Performed at [facility], [address], [place], [place] [address]    Special Requests   Final    BOTTLES DRAWN AEROBIC AND ANAEROBIC Blood Culture adequate volume Performed at [facility], [address] [place], [place] [address]    Culture   Final    NO GROWTH 5 DAYS Performed at [facility], [address] [place], [place] [address]    Report Status 02/17/2019 FINAL  Final  Urine culture     Status: None   Collection Time: 02/13/19  1:31 AM   Specimen: Urine, Random  Result Value Ref Range Status   Specimen Description   Final     URINE, RANDOM Performed at [facility], [address], [place], [place] [address]    Special Requests   Final    NONE Performed at [facility], [address] [place], [place] [address]    Culture   Final    NO GROWTH Performed at [facility], [address] [place], [place] [address]    Report Status 02/14/2019 FINAL  Final  MRSA PCR Screening     Status: None   Collection Time: 02/16/19 12:40 AM   Specimen: Nasal Mucosa; Nasopharyngeal  Result Value Ref Range Status   MRSA by PCR NEGATIVE NEGATIVE Final    Comment:        The GeneXpert MRSA Assay (FDA approved for NASAL specimens only), is one component of a comprehensive MRSA colonization surveillance program. It is not intended to diagnose MRSA infection nor to guide or monitor treatment for MRSA infections. Performed at [facility], [address] [place], [place] [address]   CSF culture with Stat gram stain     Status: None   Collection Time: 02/16/19  1:39 PM   Specimen: CSF; Cerebrospinal Fluid  Result Value Ref Range Status   Specimen Description CSF  Final   Special Requests Immunocompromised  Final   Gram Stain   Final    WBC PRESENT,BOTH PMN AND MONONUCLEAR NO ORGANISMS SEEN CYTOSPIN SMEAR    Culture   Final    NO GROWTH 3 DAYS Performed at [facility], [place] [address], [place], [place] [address]    Report Status 02/19/2019 FINAL  Final  Culture, fungus without smear     Status: None (Preliminary result)   Collection Time: 02/16/19  1:39 PM   Specimen: CSF; Other  Result Value Ref Range Status   Specimen Description CSF  Final   Special Requests Immunocompromised  Final   Culture   Final    NO FUNGUS ISOLATED AFTER 2 DAYS Performed at [facility], [address], [place],  [address]    Report Status PENDING  Incomplete  Acid Fast Smear (AFB)     Status: None   Collection Time: 02/16/19  1:39 PM   Specimen:  Cerebrospinal Fluid  Result Value Ref Range Status   AFB Specimen Processing Concentration  Final   Acid Fast Smear Negative  Final    Comment: (NOTE) Performed At: [facility] [place], [place] [id number] Rush Farmer MD [id number]    Source (AFB) CSF  Final    Comment: Performed at [facility], [place] [address] [place],  [address]    Impression/Plan:  1. Encephalitis - viral etiology likely.  Would not be bacterial and all cultures negative. Off of acyclovir.  I have also stopped doxycycline with the negative RMSF.   Slow improvement.  2. Fever - has remained afebrile now > 24 hours.  Will continue to monitor  3.  Cough - I have recommended sitting up, spirometry since she has mainly been lying around.   4.  Muscle spasm - no concerns on back exam.  Some tenderness but has not had a fall or trauma to the area.  She will try ibuprofen and heat.

## 2019-02-20 LAB — BASIC METABOLIC PANEL
Anion gap: 10 (ref 5–15)
BUN: 5 mg/dL — ABNORMAL LOW (ref 6–20)
CO2: 25 mmol/L (ref 22–32)
Calcium: 8.9 mg/dL (ref 8.9–10.3)
Chloride: 95 mmol/L — ABNORMAL LOW (ref 98–111)
Creatinine, Ser: 0.58 mg/dL (ref 0.44–1.00)
GFR calc Af Amer: >60 mL/min (ref >60)
GFR calc non Af Amer: >60 mL/min (ref >60)
Glucose, Bld: 162 mg/dL — ABNORMAL HIGH (ref 70–99)
Potassium: 3 mmol/L — ABNORMAL LOW (ref 3.5–5.1)
Sodium: 130 mmol/L — ABNORMAL LOW (ref 135–145)

## 2019-02-20 LAB — CBC WITH DIFFERENTIAL/PLATELET
Abs Immature Granulocytes: 0.04 10*3/uL (ref 0.00–0.07)
Basophils Absolute: 0 10*3/uL (ref 0.0–0.1)
Basophils Relative: 0 %
Eosinophils Absolute: 0 10*3/uL (ref 0.0–0.5)
Eosinophils Relative: 0 %
HCT: 33.2 % — ABNORMAL LOW (ref 36.0–46.0)
Hemoglobin: 10.8 g/dL — ABNORMAL LOW (ref 12.0–15.0)
Immature Granulocytes: 1 %
Lymphocytes Relative: 20 %
Lymphs Abs: 1.5 10*3/uL (ref 0.7–4.0)
MCH: 27.2 pg (ref 26.0–34.0)
MCHC: 32.5 g/dL (ref 30.0–36.0)
MCV: 83.6 fL (ref 80.0–100.0)
Monocytes Absolute: 0.6 10*3/uL (ref 0.1–1.0)
Monocytes Relative: 8 %
Neutro Abs: 5.4 10*3/uL (ref 1.7–7.7)
Neutrophils Relative %: 71 %
Platelets: 355 10*3/uL (ref 150–400)
RBC: 3.97 MIL/uL (ref 3.87–5.11)
RDW: 14.8 % (ref 11.5–15.5)
WBC: 7.6 10*3/uL (ref 4.0–10.5)
nRBC: 0 % (ref 0.0–0.2)

## 2019-02-20 LAB — GLUCOSE, CAPILLARY
Glucose-Capillary: 135 mg/dL — ABNORMAL HIGH (ref 70–99)
Glucose-Capillary: 146 mg/dL — ABNORMAL HIGH (ref 70–99)
Glucose-Capillary: 183 mg/dL — ABNORMAL HIGH (ref 70–99)
Glucose-Capillary: 166 mg/dL — ABNORMAL HIGH (ref 70–99)

## 2019-02-20 MED ORDER — POTASSIUM CHLORIDE CRYS ER 10 MEQ PO TBCR
10.0000 meq | EXTENDED_RELEASE_TABLET | Freq: Once | ORAL | Status: AC
  Administered 2019-02-20: 10 meq via ORAL
  Filled 2019-02-20: qty 1

## 2019-02-20 MED ORDER — POLYETHYLENE GLYCOL 3350 17 G PO PACK
17.0000 g | PACK | Freq: Every day | ORAL | Status: DC
  Administered 2019-02-21: 17 g via ORAL
  Filled 2019-02-20: qty 1

## 2019-02-20 MED ORDER — DOCUSATE SODIUM 100 MG PO CAPS
100.0000 mg | ORAL_CAPSULE | Freq: Two times a day (BID) | ORAL | Status: DC
  Administered 2019-02-21: 100 mg via ORAL
  Filled 2019-02-20: qty 1

## 2019-02-20 MED ORDER — ONDANSETRON HCL 4 MG/2ML IJ SOLN: 4.0000 mg | Freq: Three times a day (TID) | INTRAMUSCULAR | Status: DC | PRN

## 2019-02-20 MED ORDER — POTASSIUM CHLORIDE CRYS ER 10 MEQ PO TBCR
10.0000 meq | EXTENDED_RELEASE_TABLET | Freq: Once | ORAL | Status: AC
  Administered 2019-02-20: 10 meq via ORAL

## 2019-02-20 MED ORDER — POTASSIUM CHLORIDE IN NACL 20-0.9 MEQ/L-% IV SOLN
INTRAVENOUS | Status: DC
  Administered 2019-02-20 – 2019-02-21 (×2): via INTRAVENOUS
  Filled 2019-02-20 (×2): qty 1000

## 2019-02-20 NOTE — Progress Notes (Signed)
Interim note  Patient's HSV PCR was negative. Acyclovir discontinued by ID. VZV, CMV and [place] still pending.  Per chart review, mental status has continued to improve.  Patient can follow up with Neurology as outpatient

## 2019-02-20 NOTE — Progress Notes (Signed)
Patient out of bed to bathroom a couple of times through out the shift, patient has an unsteady gait.  Patient also walks away from walker or pushes it out away from her.  Patient also keeps asking for family member to pull her from a lying position to sitting when wanting to get out of bed.  RN educated on the importance of patient to try to assist herself in getting out of bed.

## 2019-02-20 NOTE — Progress Notes (Signed)
Ref: Dixie Dials, MD   Subjective:  Awake. Decreasing fever and headache. Trying to increase activity and oral intake.  Objective:  Vital Signs in the last 24 hours: Temp:  [98.3 F (36.8 C)-99.8 F (37.7 C)] 98.3 F (36.8 C) (07/26 0800) Pulse Rate:  [76-90] 90 (07/26 0800) Cardiac Rhythm: Normal sinus rhythm (07/26 0700) Resp:  [18-78] 18 (07/26 0800) BP: (141-148)/(72-80) 142/79 (07/26 0800) SpO2:  [94 %-99 %] 94 % (07/26 0800)  Physical Exam: BP Readings from Last 1 Encounters:  02/20/19 (!) 142/79     Wt Readings from Last 1 Encounters:  02/16/19 90.1 kg    Weight change:  Body mass index is 35.19 kg/m. HEENT: [place]/AT, Eyes-Brown, PERL, EOMI, Conjunctiva-Pink, Sclera-Non-icteric Neck: No JVD, No bruit, Trachea midline. Lungs:  Clear, Bilateral. Cardiac:  Regular rhythm, normal S1 and S2, no S3. II/VI systolic murmur. Abdomen:  Soft, non-tender. BS present. Extremities:  No edema present. No cyanosis. No clubbing. CNS: AxOx3, Cranial nerves grossly intact, moves all 4 extremities. Unsteady gait. Skin: Warm and dry.   Intake/Output from previous day: No intake/output data recorded.    Lab Results: BMET    Component Value Date/Time   NA 131 (L) 02/18/2019 0459   NA 133 (L) 02/16/2019 0135   NA 132 (L) 02/15/2019 2127   NA 139 11/30/2014 1001   K 3.3 (L) 02/18/2019 0459   K 3.1 (L) 02/16/2019 0135   K 3.5 02/15/2019 2127   CL 100 02/18/2019 0459   CL 95 (L) 02/15/2019 2127   CL 97 (L) 02/15/2019 0920   CO2 19 (L) 02/18/2019 0459   CO2 22 02/15/2019 2127   CO2 23 02/15/2019 0920   GLUCOSE 155 (H) 02/18/2019 0459   GLUCOSE 175 (H) 02/15/2019 2127   GLUCOSE 193 (H) 02/15/2019 0920   BUN <5 (L) 02/18/2019 0459   BUN 7 02/15/2019 2127   BUN 8 02/15/2019 0920   BUN 11 11/30/2014 1001   CREATININE 0.60 02/19/2019 0336   CREATININE 0.74 02/18/2019 0459   CREATININE 0.59 02/15/2019 2127   CALCIUM 8.4 (L) 02/18/2019 0459   CALCIUM 8.9 02/15/2019 2127   CALCIUM 8.8 (L) 02/15/2019 0920   GFRNONAA >60 02/19/2019 0336   GFRNONAA >60 02/18/2019 0459   GFRNONAA >60 02/15/2019 2127   GFRAA >60 02/19/2019 0336   GFRAA >60 02/18/2019 0459   GFRAA >60 02/15/2019 2127   CBC    Component Value Date/Time   WBC 7.6 02/20/2019 1120   RBC 3.97 02/20/2019 1120   HGB 10.8 (L) 02/20/2019 1120   HGB 12.5 11/30/2014 1001   HCT 33.2 (L) 02/20/2019 1120   HCT 38.6 11/30/2014 1001   PLT 355 02/20/2019 1120   PLT 369 11/30/2014 1001   MCV 83.6 02/20/2019 1120   MCV 83 11/30/2014 1001   MCH 27.2 02/20/2019 1120   MCHC 32.5 02/20/2019 1120   RDW 14.8 02/20/2019 1120   RDW 14.1 11/30/2014 1001   LYMPHSABS 1.5 02/20/2019 1120   LYMPHSABS 2.2 11/30/2014 1001   MONOABS 0.6 02/20/2019 1120   EOSABS 0.0 02/20/2019 1120   EOSABS 0.2 11/30/2014 1001   BASOSABS 0.0 02/20/2019 1120   BASOSABS 0.0 11/30/2014 1001   HEPATIC Function Panel Recent Labs    02/12/19 1857 02/18/19 0459  PROT 8.1 6.7   HEMOGLOBIN A1C No components found for: HGA1C,  MPG CARDIAC ENZYMES Lab Results  Component Value Date   CKTOTAL 18 (L) 02/14/2019   CKMB 0.9 02/14/2019   BNP No results for input(s):  PROBNP in the last 8760 hours. TSH Recent Labs    02/13/19 0629  TSH 0.367   CHOLESTEROL Recent Labs    02/16/19 0639  CHOL 167    Scheduled Meds: .  stroke: mapping our early stages of recovery book   Does not apply Once  . amLODipine  5 mg Oral QHS  . aspirin  300 mg Rectal Daily   Or  . aspirin  325 mg Oral Daily  . clopidogrel  75 mg Oral Daily  . enoxaparin (LOVENOX) injection  40 mg Subcutaneous QHS  . feeding supplement  1 Container Oral TID BM  . folic acid  2 mg Oral Daily  . guaiFENesin  5 mL Oral Q4H while awake  . insulin aspart  0-9 Units Subcutaneous TID WC  . irbesartan  75 mg Oral Daily  . levothyroxine  75 mcg Oral Q0600  . methotrexate  20 mg Oral Weekly  . metoprolol succinate  50 mg Oral Daily  . multivitamin with minerals  1 tablet  Oral Daily  . ondansetron (ZOFRAN) IV  4 mg Intravenous TID AC  . pantoprazole  40 mg Oral Daily  . sodium chloride flush  10-40 mL Intracatheter Q12H   Continuous Infusions: PRN Meds:.acetaminophen, Muscle Rub, sodium chloride flush  Assessment/Plan: Acute viral encephalitis Recent or remote lacunar infarct of right corona radiata Fever, resolving Headache and neck pain, resolving Type 2 DM Obesity Rheumatoid arthritis Hypokalemia Unsteady gait  Increase activity as tolerated. Blood work today. Continue encourage to increase oral intake. Increase laxative for constipation.   LOS: 8 days   Time spent including chart review, lab review, examination, discussion with patient : 30 min   [name]  MD  02/20/2019, 11:37 AM

## 2019-02-21 ENCOUNTER — Inpatient Hospital Stay (HOSPITAL_COMMUNITY)
Admission: EM | Admit: 2019-02-21 | Discharge: 2019-03-04 | DRG: 097 | Disposition: A | Discharge status: Home / Self Care | Payer: Medicaid Other | Attending: Cardiovascular Disease | Admitting: Cardiovascular Disease

## 2019-02-21 ENCOUNTER — Emergency Department (HOSPITAL_COMMUNITY): Payer: Medicaid Other

## 2019-02-21 ENCOUNTER — Encounter (HOSPITAL_COMMUNITY): Payer: Self-pay

## 2019-02-21 ENCOUNTER — Other Ambulatory Visit: Payer: Self-pay

## 2019-02-21 ENCOUNTER — Inpatient Hospital Stay (HOSPITAL_COMMUNITY)
Admission: RE | Admit: 2019-02-21 | Payer: Medicaid Other | Source: Intra-hospital | Admitting: Physical Medicine & Rehabilitation

## 2019-02-21 DIAGNOSIS — E119 Type 2 diabetes mellitus without complications: Secondary | ICD-10-CM

## 2019-02-21 DIAGNOSIS — M069 Rheumatoid arthritis, unspecified: Secondary | ICD-10-CM | POA: Diagnosis present

## 2019-02-21 DIAGNOSIS — R7982 Elevated C-reactive protein (CRP): Secondary | ICD-10-CM | POA: Diagnosis present

## 2019-02-21 DIAGNOSIS — G0481 Other encephalitis and encephalomyelitis: Principal | ICD-10-CM | POA: Diagnosis present

## 2019-02-21 DIAGNOSIS — E11649 Type 2 diabetes mellitus with hypoglycemia without coma: Secondary | ICD-10-CM | POA: Diagnosis not present

## 2019-02-21 DIAGNOSIS — Z794 Long term (current) use of insulin: Secondary | ICD-10-CM

## 2019-02-21 DIAGNOSIS — Z20828 Contact with and (suspected) exposure to other viral communicable diseases: Secondary | ICD-10-CM | POA: Diagnosis present

## 2019-02-21 DIAGNOSIS — F309 Manic episode, unspecified: Secondary | ICD-10-CM | POA: Diagnosis not present

## 2019-02-21 DIAGNOSIS — E860 Dehydration: Secondary | ICD-10-CM | POA: Diagnosis present

## 2019-02-21 DIAGNOSIS — Z79899 Other long term (current) drug therapy: Secondary | ICD-10-CM

## 2019-02-21 DIAGNOSIS — Z7902 Long term (current) use of antithrombotics/antiplatelets: Secondary | ICD-10-CM

## 2019-02-21 DIAGNOSIS — G4700 Insomnia, unspecified: Secondary | ICD-10-CM | POA: Diagnosis present

## 2019-02-21 DIAGNOSIS — Z7982 Long term (current) use of aspirin: Secondary | ICD-10-CM

## 2019-02-21 DIAGNOSIS — Z8249 Family history of ischemic heart disease and other diseases of the circulatory system: Secondary | ICD-10-CM

## 2019-02-21 DIAGNOSIS — Z79890 Hormone replacement therapy (postmenopausal): Secondary | ICD-10-CM

## 2019-02-21 DIAGNOSIS — G9340 Encephalopathy, unspecified: Secondary | ICD-10-CM

## 2019-02-21 DIAGNOSIS — G9341 Metabolic encephalopathy: Secondary | ICD-10-CM | POA: Diagnosis present

## 2019-02-21 DIAGNOSIS — E039 Hypothyroidism, unspecified: Secondary | ICD-10-CM | POA: Diagnosis present

## 2019-02-21 DIAGNOSIS — R441 Visual hallucinations: Secondary | ICD-10-CM | POA: Diagnosis present

## 2019-02-21 DIAGNOSIS — Z8673 Personal history of transient ischemic attack (TIA), and cerebral infarction without residual deficits: Secondary | ICD-10-CM

## 2019-02-21 DIAGNOSIS — T394X5A Adverse effect of antirheumatics, not elsewhere classified, initial encounter: Secondary | ICD-10-CM | POA: Diagnosis present

## 2019-02-21 DIAGNOSIS — T380X5A Adverse effect of glucocorticoids and synthetic analogues, initial encounter: Secondary | ICD-10-CM | POA: Diagnosis present

## 2019-02-21 DIAGNOSIS — Z6835 Body mass index (BMI) 35.0-35.9, adult: Secondary | ICD-10-CM

## 2019-02-21 DIAGNOSIS — R4182 Altered mental status, unspecified: Secondary | ICD-10-CM | POA: Diagnosis present

## 2019-02-21 DIAGNOSIS — I10 Essential (primary) hypertension: Secondary | ICD-10-CM | POA: Diagnosis present

## 2019-02-21 DIAGNOSIS — E1165 Type 2 diabetes mellitus with hyperglycemia: Secondary | ICD-10-CM | POA: Diagnosis present

## 2019-02-21 LAB — BASIC METABOLIC PANEL
Anion gap: 9 (ref 5–15)
BUN: 6 mg/dL (ref 6–20)
CO2: 24 mmol/L (ref 22–32)
Calcium: 8.9 mg/dL (ref 8.9–10.3)
Chloride: 102 mmol/L (ref 98–111)
Creatinine, Ser: 0.55 mg/dL (ref 0.44–1.00)
GFR calc Af Amer: >60 mL/min (ref >60)
GFR calc non Af Amer: >60 mL/min (ref >60)
Glucose, Bld: 152 mg/dL — ABNORMAL HIGH (ref 70–99)
Potassium: 3.6 mmol/L (ref 3.5–5.1)
Sodium: 135 mmol/L (ref 135–145)

## 2019-02-21 LAB — GLUCOSE, CAPILLARY
Glucose-Capillary: 143 mg/dL — ABNORMAL HIGH (ref 70–99)
Glucose-Capillary: 207 mg/dL — ABNORMAL HIGH (ref 70–99)
Glucose-Capillary: 148 mg/dL — ABNORMAL HIGH (ref 70–99)

## 2019-02-21 LAB — VARICELLA-ZOSTER BY PCR: Varicella-Zoster, PCR: NEGATIVE

## 2019-02-21 MED ORDER — POLYETHYLENE GLYCOL 3350 17 G PO PACK: 17.0000 g | PACK | Freq: Every day | ORAL | 3 refills | Status: AC

## 2019-02-21 MED ORDER — LORAZEPAM 2 MG/ML IJ SOLN
INTRAMUSCULAR | Status: AC
  Administered 2019-02-21: 2 mg
  Filled 2019-02-21: qty 1

## 2019-02-21 MED ORDER — CLOPIDOGREL BISULFATE 75 MG PO TABS: 75.0000 mg | ORAL_TABLET | Freq: Every day | ORAL | 3 refills | Status: AC

## 2019-02-21 MED ORDER — POTASSIUM CHLORIDE ER 10 MEQ PO TBCR
10.0000 meq | EXTENDED_RELEASE_TABLET | Freq: Every day | ORAL | Status: DC
  Administered 2019-02-21: 10 meq via ORAL
  Filled 2019-02-21 (×2): qty 1

## 2019-02-21 MED ORDER — METHOTREXATE SODIUM 2.5 MG PO TABS: 20.0000 mg | ORAL_TABLET | ORAL | Status: AC

## 2019-02-21 MED ORDER — POTASSIUM CHLORIDE ER 10 MEQ PO TBCR: 10.0000 meq | EXTENDED_RELEASE_TABLET | Freq: Every day | ORAL | 3 refills | Status: DC

## 2019-02-21 MED ORDER — ASPIRIN 325 MG PO TABS: 325.0000 mg | ORAL_TABLET | Freq: Every day | ORAL | Status: AC

## 2019-02-21 MED ORDER — HALOPERIDOL LACTATE 5 MG/ML IJ SOLN
5.0000 mg | Freq: Once | INTRAMUSCULAR | Status: AC
  Administered 2019-02-21: 5 mg via INTRAMUSCULAR
  Filled 2019-02-21: qty 1

## 2019-02-21 MED ORDER — DOCUSATE SODIUM 100 MG PO CAPS: 100.0000 mg | ORAL_CAPSULE | Freq: Two times a day (BID) | ORAL | 3 refills | Status: DC

## 2019-02-21 NOTE — Progress Notes (Signed)
Physical Therapy Treatment Patient Details Name: Victoria Cox MRN: [id number] DOB: [birth year] Today's Date: 02/21/2019    History of Present Illness 58 y.o. female with a history of rheumatoid arthritis who presents with a several week history of myalgias, headache, mouth sores, generalized weakness. She had an episode of worsening confusion and Speech disturbance on 02/15/2019. 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 EEG shows diffuse slowing and triphasics. Pt also now s/p spinal tap on 7/22    PT Comments    Pt progressing towards goals. Was alert throughout session, however, remains impulsive with decreased safety awareness. Pt also with neck and back pain. Pt requiring min A for steadying to transfer to chair this session, and required safety cues throughout for safe use of RW. Reviewed seated HEP with pt. Will continue to follow acutely to maximize functional mobility independence and safety.    Follow Up Recommendations  CIR;Supervision/Assistance - 24 hour     Equipment Recommendations  Other (comment)(TBA)    Recommendations for Other Services Rehab consult     Precautions / Restrictions Precautions Precautions: Fall Restrictions Weight Bearing Restrictions: No    Mobility  Bed Mobility Overal bed mobility: Needs Assistance Bed Mobility: Supine to Sit     Supine to sit: Min assist     General bed mobility comments: Min A for trunk elevation to come to EOB. Pt complaining of increased back and neck pain.   Transfers Overall transfer level: Needs assistance Equipment used: Rolling walker (2 wheeled) Transfers: Sit to/from [employer] Sit to Stand: Min assist Stand pivot transfers: Min assist       General transfer comment: Min A for lift assist and steadying. Pt requiring safety cues for safe use of RW as she wanted to leave RW behind. Min A for steadying throughout. Pt impulsive and not waiting for PT to perform mobility tasks.    Ambulation/Gait                 Stairs             Wheelchair Mobility    Modified Rankin (Stroke Patients Only)       Balance Overall balance assessment: Needs assistance Sitting-balance support: No upper extremity supported;Feet supported Sitting balance-Leahy Scale: Fair     Standing balance support: Bilateral upper extremity supported;No upper extremity supported;During functional activity Standing balance-Leahy Scale: Poor Standing balance comment: Reliant on UE and external support                             Cognition Arousal/Alertness: Awake/alert Behavior During Therapy: Flat affect;Impulsive Overall Cognitive Status: Impaired/Different from baseline Area of Impairment: Safety/judgement                         Safety/Judgement: Decreased awareness of safety;Decreased awareness of deficits     General Comments: Pt impulsive and requiring cues to wait for PT prior to performing transfers.       Exercises General Exercises - Lower Extremity Ankle Circles/Pumps: AROM;Both;10 reps;Supine Long Arc Quad: AROM;Both;10 reps;Supine    General Comments        Pertinent Vitals/Pain Pain Assessment: Faces Faces Pain Scale: Hurts even more Pain Location: back of neck Pain Descriptors / Indicators: Grimacing;Guarding Pain Intervention(s): Limited activity within patient's tolerance;Monitored during session;Repositioned    Home Living  Prior Function            PT Goals (current goals can now be found in the care plan section) Acute Rehab PT Goals Patient Stated Goal: to decrease pain  PT Goal Formulation: Patient unable to participate in goal setting Time For Goal Achievement: 03/02/19 Potential to Achieve Goals: Good Progress towards PT goals: Progressing toward goals    Frequency    Min 4X/week      PT Plan Current plan remains appropriate    Co-evaluation               AM-PAC PT "6 Clicks" Mobility   Outcome Measure  Help needed turning from your back to your side while in a flat bed without using bedrails?: A Little Help needed moving from lying on your back to sitting on the side of a flat bed without using bedrails?: A Little Help needed moving to and from a bed to a chair (including a wheelchair)?: A Little Help needed standing up from a chair using your arms (e.g., wheelchair or bedside chair)?: A Little Help needed to walk in hospital room?: A Little Help needed climbing 3-5 steps with a railing? : A Lot 6 Click Score: 17    End of Session Equipment Utilized During Treatment: Gait belt Activity Tolerance: Patient limited by pain Patient left: in chair;with call bell/phone within reach;with chair alarm set;with family/visitor present Nurse Communication: Mobility status;Patient requests pain meds PT Visit Diagnosis: Other abnormalities of gait and mobility (R26.89)     Time: 0354-6568 PT Time Calculation (min) (ACUTE ONLY): 13 min  Charges:  $Therapeutic Activity: 8-22 mins                     Leighton Ruff, PT, DPT  Acute Rehabilitation Services  Pager: [phone] Office: [phone]    Rudean Hitt 02/21/2019, 12:16 PM

## 2019-02-21 NOTE — Discharge Summary (Signed)
Physician Discharge Summary  Patient ID: Victoria Cox MRN: [id number] DOB/AGE: 1961/04/08 58 y.o.  Admit date: 02/12/2019 Discharge date: 02/21/2019  Admission Diagnoses: Generalized weakness R/O sepsis Myalgias Diabetes Mellitus Morbid obesity Rheumatoid arthritis  Discharge Diagnoses:  Principle problem: Acute viral encephalitis Active Problems:   Generalized weakness, improving   Stroke (HCC), Lacunar infarct of right corona radiata   Acute respiratory insufficiency, resolved   Acute metabolic encephalopathy, improving   Hypertension, essential   Hyponatremia, resolved   Hypokalemia, resolved   Type 2 DM   Obesity   Rheumatoid arthritis   Headache   Neck pain   Unsteady gait   Constipation, slow transit   Hypothyroidism  Discharged Condition: fair  Hospital Course: 58 years old Asian female with h/o rheumatoid arthritis had several weeks of myalgias, headache, mouth sores and generalized weakness. She had acute mental status change prompting family to bring her to the hospital and had another episode of mental status change with increased irritability on 02/15/2019. Neurology consult of Dr. Melinda Crutch was obtained. CT scan was non revealing but CT angiogram showed suspected M2 occlusion. Carotid angiogram was done post sedation and intubation for possible thrombectomy but M2 occlusion was ruled out. MRI of brain showed recent or remote lacunar infarct of right corona radiata. EEG was negative for seizure activity and positive for mild diffuse encephalopathy Next day with elevated WBC count and fever she underwent LP. Opening pressure was elevated at 44 cm and closing pressure was 15 cms. CSF fluid was negative for bacteria, Acid Fast organism, fungus or virus like Herpes simplex And CMV. Varicella zoster by PCR, Arborvirus panel and JC virus results are pending. She was on IV acyclovir for Herpes Simplex and doxycycline for RMSF until tests were negative.  Her condition is  gradually improving and she is trying to ambulate independently as much as possible. She has mild headache and neck pain and significant weakness with gait abnormality.  Her oral intake has gradually improved.  Home health nurse, PT and nurses aid is arranged along with 4 wheel walker with seat and bedside commode use. In house Rehab was declined by patient and family. She will discontinue Humira and decrease methotrexate to 20 mg per week with folic acid supplement.  She will see me in 1 week, her rheumatologist from [facility] health in 2 weeks and neurologist as arranged.  Consults: cardiology, infectious disease, pulmonary/intensive care, neurology and Intensive Radiologist  Significant Diagnostic Studies: labs: CBC was normal initially then increased to 12.9K with transient increase in platelets count to 470K then back down to normal. BMET showed mild hyponatremia and occasional hypokalemia. BUN and Cr. Were normal. Blood sugar control was acceptable. with Hgb A1C of 7.1. TSH was 0.367 Lyme disease western blot was negative. CK was low at 18 U/L for myalgia. AM cortisol was 20.7 mcg/dL. Lipase 29. Total cholesterol was 167 mg. HDL was 30 mg. and LDL was 85 mg./dL. Triglyceride was 262 mg/dL. CSF cell count RBC 17 and WBC 320/c mm, 83 % Monocytes and 15 % Segmented neutrophils, very low lymphocyte of 2 % and 0 % Eosinophil. CSF protein was 103 mg/dL and glucose was normal at 46 mg/dL. Cryptococcal Ag was negative.  CSF culture was negative for organisms and growth over 3 days.  CSF, HSV 1 DNA and HSV 2 DNA were negative. CSF, fungal culture was negative after 5 days. CSF, Acid fast smear was negative. Culture pending result. CSF, Varicella Zoster, pending. CSF, CMV DNA negative. CSF, Arborvirus panel, pending.  CSF, JC virus, pending. RMSF IgG and IgM negative.  Treatments: IV hydration, anticoagulation: ASA and Plavix.  Tylenol for headache and pain, Metformin, insulin, telmisartan,  metoprolol amlodipine, methotrexate down to 20 mg/wk, folic acid and levothyroxine. Physical therapy for ambulation.  Discharge Exam: Blood pressure (!) 164/68, pulse 67, temperature (!) 97.4 F (36.3 C), temperature source Axillary, resp. rate 18, height 5\' 3"  (1.6 m), weight 90.1 kg, SpO2 96 %. General appearance: alert, cooperative and appears stated age. Head: Normocephalic, atraumatic. Eyes: Brown eyes, pink conjunctiva, corneas clear. PERRL, EOM's intact.  Neck: No adenopathy, no carotid bruit, no JVD, supple, symmetrical, trachea midline and thyroid not enlarged. Mild tenderness back of neck. Resp: Clear to auscultation bilaterally. Cardio: Regular rate and rhythm, S1, S2 normal, II/VI systolic murmur, no click, rub or gallop. GI: Soft, non-tender; bowel sounds normal; no organomegaly. Extremities: No edema, cyanosis or clubbing. Skin: Warm and dry.  Neurologic: Alert and oriented X 3, normal strength and tone. Slow gait.  Disposition: Discharge disposition: 01-Home or Self Care        Allergies as of 02/21/2019   No Known Allergies     Medication List    STOP taking these medications   Humira Pen 40 MG/0.4ML Pnkt Generic drug: Adalimumab   ibuprofen 200 MG tablet Commonly known as: ADVIL     TAKE these medications   acetaminophen 500 MG tablet Commonly known as: TYLENOL Take 500 mg by mouth as needed for moderate pain or headache.   amLODipine 5 MG tablet Commonly known as: NORVASC Take 5 mg by mouth every evening.   aspirin 325 MG tablet Take 1 tablet (325 mg total) by mouth daily. Start taking on: February 22, 2019   clopidogrel 75 MG tablet Commonly known as: PLAVIX Take 1 tablet (75 mg total) by mouth daily. Start taking on: February 22, 2019   docusate sodium 100 MG capsule Commonly known as: COLACE Take 1 capsule (100 mg total) by mouth 2 (two) times daily.   famotidine 20 MG tablet Commonly known as: PEPCID Take 20 mg by mouth 2 (two) times  daily.   folic acid 1 MG tablet Commonly known as: FOLVITE Take 2 mg by mouth daily.   freestyle lancets Use to test blood sugar twice daily. Dx: E11.9   glucose blood test strip Commonly known as: FREESTYLE LITE Use to test blood sugar twice daily Dx: E11.9   levothyroxine 75 MCG tablet Commonly known as: SYNTHROID Take 75 mcg by mouth daily.   meclizine 25 MG tablet Commonly known as: ANTIVERT Take 25 mg by mouth 3 (three) times daily as needed for dizziness.   metFORMIN 1000 MG tablet Commonly known as: GLUCOPHAGE Take one tablet by mouth twice daily with meals to control blood sugar What changed:   how much to take  how to take this  when to take this  additional instructions   methotrexate 2.5 MG tablet Take 8 tablets (20 mg total) by mouth every 7 (seven) days. What changed: how much to take   metoprolol succinate 50 MG 24 hr tablet Commonly known as: TOPROL-XL Take 50 mg by mouth daily.   multivitamin with minerals Tabs tablet Take 1 tablet by mouth daily.   NovoLOG Mix 70/30 (70-30) 100 UNIT/ML injection Generic drug: insulin aspart protamine- aspart Inject 18 Units into the skin 2 (two) times a day.   omeprazole 20 MG capsule Commonly known as: PRILOSEC Take 20 mg by mouth 2 (two) times daily before a meal.  polyethylene glycol 17 g packet Commonly known as: MIRALAX / GLYCOLAX Take 17 g by mouth daily. Start taking on: February 22, 2019   potassium chloride 10 MEQ tablet Commonly known as: K-DUR Take 1 tablet (10 mEq total) by mouth daily.   telmisartan 20 MG tablet Commonly known as: MICARDIS Take 20 mg by mouth daily.            Durable Medical Equipment  (From admission, onward)         Start     Ordered   02/21/19 1546  For home use only DME 4 wheeled rolling walker with seat  Once    Question:  Patient needs a walker to treat with the following condition  Answer:  Unsteady gait   02/21/19 1545   02/21/19 1546  For home use  only DME Bedside commode  Once    Question:  Patient needs a bedside commode to treat with the following condition  Answer:  Weakness of both legs   02/21/19 1545         Follow-up Information    Julieanne Cotton, MD Follow up in 4 week(s).   Specialties: Interventional Radiology, Radiology Why: Please follow-up with Dr. Corliss Skains in clinic 4 weeks after discharge. Contact information: [address] [place] [address] [phone]        [name], MD. Call in 1 week(s).   Specialty: Cardiology Contact information: [address] [place] [address] [phone]        Cory Munch, MD Follow up.   Specialty: Rheumatology Why: as arranged Contact information: [address] [place] [address] [phone]           Time spent: Review of old chart, current chart, lab, x-ray, cardiac tests and discussion with patient over 60 minutes.  Signed: Ricki Rodriguez 02/21/2019, 3:46 PM

## 2019-02-21 NOTE — TOC Transition Note (Signed)
Transition of Care [facility]) - CM/SW Discharge Note   Patient Details  Name: Victoria Cox MRN: [id number] Date of Birth: 11/23/1960  [facility]) CM/SW Contact:  Geralynn Ochs, LCSW Phone Number: 02/21/2019, 5:09 PM   Clinical Narrative:   CSW met with patient and family at bedside. Referrals for equipment sent to [employer]. Family would prefer [facility] for home health. CSW sent referral, awaiting response. Family to provide transport home.    Final next level of care: [place] Barriers to Discharge: Barriers Resolved   Patient Goals and CMS Choice Patient states their goals for this hospitalization and ongoing recovery are:: go home, be in less pain CMS Medicare.gov Compare Post Acute Care list provided to:: Patient Represenative (must comment) Choice offered to / list presented to : Adult Children  Discharge Placement                       Discharge Plan and Services     Post Acute Care Choice: Home Health          DME Arranged: Bedside commode, Walker rolling with seat DME Agency: [employer] Date DME Agency Contacted: 02/21/19 Time DME Agency Contacted: [id number] Representative spoke with at DME Agency: [place]: RN, PT, Nurse's Aide          Social Determinants of Health ([place]) Interventions     Readmission Risk Interventions No flowsheet data found.

## 2019-02-21 NOTE — Progress Notes (Signed)
Inpatient Rehabilitation-Admissions Coordinator   The pt/family prefer to return home and not pursue CIR at this time. Dr. Doylene Canard aware. AC has notified CM/SW regarding pt/family preference. AC will sign off.   Please call if questions.   Jhonnie Garner, OTR/L  Rehab Admissions Coordinator  [phone] 02/21/2019 3:45 PM

## 2019-02-21 NOTE — Progress Notes (Signed)
Pt discharge education and instructions completed with pt and daughter at bedside; all voices understanding and denies any questions. Pt IV and telemetry removed; pt discharge home with daughter to transport off to disposition. Pt to pick up electronically sent prescriptions from preferred pharmacy on file. Pt home DME walker and 3 in 1 equipments delivered to pt at bedside. Pt transported off unit via wheelchair with belongings including equipments to the side. Delia Heady RN

## 2019-02-21 NOTE — Progress Notes (Signed)
Patient states that her entire body aches.  Patient given tylenol and muscle rub applied.  Patient moods and yells when touched, repositioning in bed.  Patient continues to request family members to pull her from lying position to sitting and standing position.

## 2019-02-21 NOTE — ED Triage Notes (Signed)
Pt BIB [facility] For eval of worsening AMS since she was d/c'd this afternoon. 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 Daughter in law reports that she was slightly altered on DC, however this evening she became acutely combative, much more altered. Rec'd 10mg  Versed IM w/ minimal effect.

## 2019-02-21 NOTE — ED Provider Notes (Signed)
[facility] Provider Note   CSN: [id number] Arrival date & time: 02/21/19  2300     History   Chief Complaint Chief Complaint  Patient presents with   Altered Mental Status   Level 5 caveat due to altered mental status HPI Jerre SimonKamini Dinning is a 58 y.o. female.     The history is provided by a relative. The history is limited by the condition of the patient.  Altered Mental Status Presenting symptoms: behavior changes, combativeness and confusion   Severity:  Severe Most recent episode:  Today Episode history:  Single Timing:  Constant Progression:  Worsening Chronicity:  New Associated symptoms: agitation   Patient with history of Rheumatoid Arthritis, diabetes, high blood pressure presents with altered mental status. Patient is non-English speaking, history provided by daughter-in-law Patient was just released from the hospital. Patient was found to have viral encephalitis She was improved earlier today, walking/taking PO and requested d/c home.  Soon after going home she became abruptly combative and agitated   Past Medical History:  Diagnosis Date   Arthritis    Diabetes (HCC)    High blood pressure     Patient Active Problem List   Diagnosis Date Noted   Acute respiratory insufficiency    Acute metabolic encephalopathy    Hyponatremia    Stroke (HCC) 02/15/2019   Generalized weakness 02/12/2019    Past Surgical History:  Procedure Laterality Date   IR ANGIO EXTERNAL CAROTID SEL EXT CAROTID UNI R MOD SED  02/16/2019   IR ANGIO INTRA EXTRACRAN SEL COM CAROTID INNOMINATE BILAT MOD SED  02/16/2019   IR ANGIO VERTEBRAL SEL VERTEBRAL BILAT MOD SED  02/16/2019   RADIOLOGY WITH ANESTHESIA N/A 02/15/2019   Procedure: IR WITH ANESTHESIA;  Surgeon: Julieanne Cottoneveshwar, Sanjeev, MD;  Location: [facility] OR;  Service: Radiology;  Laterality: N/A;     OB History   No obstetric history on file.      Home Medications    Prior to  Admission medications   Medication Sig Start Date End Date Taking? Authorizing Provider  acetaminophen (TYLENOL) 500 MG tablet Take 500 mg by mouth as needed for moderate pain or headache.     [provider]  amLODipine (NORVASC) 5 MG tablet Take 5 mg by mouth every evening.  12/31/18   [provider]  aspirin 325 MG tablet Take 1 tablet (325 mg total) by mouth daily. 02/22/19   Orpah CobbKadakia, Ajay, MD  clopidogrel (PLAVIX) 75 MG tablet Take 1 tablet (75 mg total) by mouth daily. 02/22/19   Orpah CobbKadakia, Ajay, MD  docusate sodium (COLACE) 100 MG capsule Take 1 capsule (100 mg total) by mouth 2 (two) times daily. 02/21/19   Orpah CobbKadakia, Ajay, MD  famotidine (PEPCID) 20 MG tablet Take 20 mg by mouth 2 (two) times daily.    [provider]  folic acid (FOLVITE) 1 MG tablet Take 2 mg by mouth daily.  02/02/19   [provider]  glucose blood (FREESTYLE LITE) test strip Use to test blood sugar twice daily Dx: E11.9 12/08/14   Sharon SellerEubanks, Jessica K, NP  Lancets (FREESTYLE) lancets Use to test blood sugar twice daily. Dx: E11.9 12/08/14   Sharon SellerEubanks, Jessica K, NP  levothyroxine (SYNTHROID) 75 MCG tablet Take 75 mcg by mouth daily. 12/31/18   [provider]  meclizine (ANTIVERT) 25 MG tablet Take 25 mg by mouth 3 (three) times daily as needed for dizziness.    [provider]  metFORMIN (GLUCOPHAGE) 1000 MG tablet  Take one tablet by mouth twice daily with meals to control blood sugar Patient taking differently: Take 1,000 mg by mouth 2 (two) times daily with a meal.  01/09/15   Sharon Seller, NP  methotrexate 2.5 MG tablet Take 8 tablets (20 mg total) by mouth every 7 (seven) days. 02/21/19   [name], MD  metoprolol succinate (TOPROL-XL) 50 MG 24 hr tablet Take 50 mg by mouth daily. 01/24/19   [provider]  Multiple Vitamin (MULTIVITAMIN WITH MINERALS) TABS tablet Take 1 tablet by mouth daily.    [provider]  NOVOLOG MIX 70/30 (70-30) 100 UNIT/ML  injection Inject 18 Units into the skin 2 (two) times a day. 01/24/19   [provider]  omeprazole (PRILOSEC) 20 MG capsule Take 20 mg by mouth 2 (two) times daily before a meal.    [provider]  polyethylene glycol (MIRALAX / GLYCOLAX) 17 g packet Take 17 g by mouth daily. 02/22/19   [name], MD  potassium chloride (K-DUR) 10 MEQ tablet Take 1 tablet (10 mEq total) by mouth daily. 02/21/19   [name], MD  telmisartan (MICARDIS) 20 MG tablet Take 20 mg by mouth daily.    [provider]    Family History Family History  Problem Relation Age of Onset   Hypertension Mother     Social History Social History   Tobacco Use   Smoking status: Never Smoker   Smokeless tobacco: Never Used  Substance Use Topics   Alcohol use: No    Alcohol/week: 0.0 standard drinks   Drug use: No     Allergies   Patient has no known allergies.   Review of Systems Review of Systems  Unable to perform ROS: Mental status change  Psychiatric/Behavioral: Positive for agitation and confusion.     Physical Exam Updated Vital Signs Ht 1.6 m (5\' 3" )    Wt 90.1 kg    SpO2 99%    BMI 35.19 kg/m   Physical Exam CONSTITUTIONAL: agitated and combative HEAD: Normocephalic/atraumatic EYES: EOMI/PERRL ENMT: Mucous membranes moist NECK: supple no meningeal signs CV: S1/S2 noted, no murmurs/rubs/gallops noted LUNGS: Lungs are clear to auscultation bilaterally, no apparent distress ABDOMEN: soft, nontender  NEURO: Pt is awake/alert, maex4, combative, screaming EXTREMITIES: pulses normal/equal, full ROM, no deformities SKIN: warm, color normal PSYCH: unable to assess   ED Treatments / Results  Labs (all labs ordered are listed, but only abnormal results are displayed) Labs Reviewed  COMPREHENSIVE METABOLIC PANEL - Abnormal; Notable for the following components:      Result Value   Sodium 133 (*)    Chloride 97 (*)    CO2 20 (*)    Glucose, Bld 184 (*)     Total Protein 8.8 (*)    AST 65 (*)    ALT 64 (*)    Anion gap 16 (*)    All other components within normal limits  CBC WITH DIFFERENTIAL/PLATELET - Abnormal; Notable for the following components:   WBC 10.9 (*)    Platelets 473 (*)    Neutro Abs 8.6 (*)    All other components within normal limits  POCT I-STAT EG7 - Abnormal; Notable for the following components:   pH, Ven 7.553 (*)    pCO2, Ven 25.5 (*)    pO2, Ven 53.0 (*)    Sodium 134 (*)    Calcium, Ion 1.08 (*)    All other components within normal limits  SARS CORONAVIRUS 2 (HOSPITAL ORDER, PERFORMED IN  [facility])  CULTURE, BLOOD (ROUTINE X 2)  CULTURE, BLOOD (ROUTINE X 2)  LACTIC ACID, PLASMA  PROTIME-INR  TSH  AMMONIA  LACTIC ACID, PLASMA  URINALYSIS, ROUTINE W REFLEX MICROSCOPIC  RAPID URINE DRUG SCREEN, HOSP PERFORMED  ETHANOL  SALICYLATE LEVEL  ACETAMINOPHEN LEVEL  CREATININE, SERUM  CBC  SEDIMENTATION RATE  C3 COMPLEMENT  C4 COMPLEMENT  VITAMIN B12  FOLATE RBC  I-STAT BETA HCG BLOOD, ED ([facility], [facility], [facility] ONLY)    EKG ED ECG REPORT   Date: 02/22/2019 0044  Rate: 134  Rhythm: sinus tachycardia  QRS Axis: normal  Intervals: normal  ST/T Wave abnormalities: nonspecific ST changes  Conduction Disutrbances:none   I have personally reviewed the EKG tracing and agree with the computerized printout as noted.   Radiology Ct Head Wo Contrast  Result Date: 02/22/2019 CLINICAL DATA:  Altered LOC EXAM: CT HEAD WITHOUT CONTRAST TECHNIQUE: Contiguous axial images were obtained from the base of the skull through the vertex without intravenous contrast. COMPARISON:  MRI 02/16/2019, CT brain 02/15/2019 FINDINGS: Brain: No acute hemorrhage or intracranial mass. Hypodensity within the right white matter, corresponding to the previously noted small focus of infarct on MRI 02/16/2019. Stable ventricle size. Scattered hypodensities in the white matter, presumably small-vessel ischemic change. Vascular: No  hyperdense vessels.  Carotid vascular calcification Skull: Normal. Negative for fracture or focal lesion. Sinuses/Orbits: Mucosal thickening in the ethmoid and maxillary sinus Other: None IMPRESSION: 1. Small hypodensity within the right white matter, corresponding to previous MRI demonstrated lacunar infarct. No other acute intracranial abnormality is seen. Electronically Signed   By: Jasmine Pang M.D.   On: 02/22/2019 01:18   Dg Chest Port 1 View  Result Date: 02/22/2019 CLINICAL DATA:  Altered mental status EXAM: PORTABLE CHEST 1 VIEW COMPARISON:  02/18/2019, 02/16/2019 FINDINGS: Low lung volumes. No focal airspace disease or effusion. Normal heart size. No pneumothorax. IMPRESSION: No active disease. Electronically Signed   By: Jasmine Pang M.D.   On: 02/22/2019 01:18    Procedures .Critical Care Performed by: Zadie Rhine, MD Authorized by: Zadie Rhine, MD   Critical care provider statement:    Critical care time (minutes):  90   Critical care start time:  02/21/2019 11:30 PM   Critical care end time:  02/22/2019 1:00 AM   Critical care time was exclusive of:  Separately billable procedures and treating other patients   Critical care was necessary to treat or prevent imminent or life-threatening deterioration of the following conditions:  CNS failure or compromise, sepsis, toxidrome and dehydration   Critical care was time spent personally by me on the following activities:  Evaluation of patient's response to treatment, examination of patient, re-evaluation of patient's condition, pulse oximetry, ordering and review of radiographic studies, ordering and review of laboratory studies, review of old charts and obtaining history from patient or surrogate   I assumed direction of critical care for this patient from another provider in my specialty: no       Medications Ordered in ED Medications  sodium chloride 0.9 % bolus 1,000 mL (1,000 mLs Intravenous New Bag/Given 02/22/19 0058)   heparin injection 5,000 Units (has no administration in time range)  0.9 % NaCl with KCl 20 mEq/ L  infusion (has no administration in time range)  sodium chloride 0.9 % bolus 1,000 mL (has no administration in time range)  haloperidol lactate (HALDOL) injection 5 mg (5 mg Intramuscular Given 02/21/19 2331)  LORazepam (ATIVAN) 2 MG/ML injection (2 mg  Given  02/21/19 2351)     Initial Impression / Assessment and Plan / ED Course  I have reviewed the triage vital signs and the nursing notes.  Pertinent labs & imaging results that were available during my care of the patient were reviewed by me and considered in my medical decision making (see chart for details).        11:36 PM Patient with recent admission for altered mental status was found to have viral encephalitis.  She was discharged earlier today, went home became acutely combative and agitated. Altered mental status work-up has been reinitiated 12:19 AM Patient required Haldol and Ativan, and is now improving.  Discussed with Dr. Doylene Canard who will come to see the patient 1:00 AM Patient resting comfortably.  Patient dehydrated, IV fluids ordered.  Altered mental status work-up will need to continue in the hospital.  Updated daughter-in-law with plan.  Dr. Doylene Canard at bedside 1:26 AM Dr Doylene Canard to admit Pt tachycardic, but other vitals appropriate IV fluids ordered CT head unchanged from prior CXR negative Final Clinical Impressions(s) / ED Diagnoses   Final diagnoses:  Encephalopathy acute    ED Discharge Orders    None       Ripley Fraise, MD 02/22/19 0127

## 2019-02-22 ENCOUNTER — Emergency Department (HOSPITAL_COMMUNITY): Payer: Medicaid Other

## 2019-02-22 ENCOUNTER — Other Ambulatory Visit: Payer: Self-pay

## 2019-02-22 DIAGNOSIS — Z7982 Long term (current) use of aspirin: Secondary | ICD-10-CM | POA: Diagnosis not present

## 2019-02-22 DIAGNOSIS — E860 Dehydration: Secondary | ICD-10-CM | POA: Diagnosis present

## 2019-02-22 DIAGNOSIS — Z79890 Hormone replacement therapy (postmenopausal): Secondary | ICD-10-CM | POA: Diagnosis not present

## 2019-02-22 DIAGNOSIS — Z20828 Contact with and (suspected) exposure to other viral communicable diseases: Secondary | ICD-10-CM | POA: Diagnosis present

## 2019-02-22 DIAGNOSIS — R451 Restlessness and agitation: Secondary | ICD-10-CM | POA: Diagnosis not present

## 2019-02-22 DIAGNOSIS — G9341 Metabolic encephalopathy: Secondary | ICD-10-CM | POA: Diagnosis present

## 2019-02-22 DIAGNOSIS — Z8249 Family history of ischemic heart disease and other diseases of the circulatory system: Secondary | ICD-10-CM | POA: Diagnosis not present

## 2019-02-22 DIAGNOSIS — T380X5A Adverse effect of glucocorticoids and synthetic analogues, initial encounter: Secondary | ICD-10-CM | POA: Diagnosis present

## 2019-02-22 DIAGNOSIS — G0481 Other encephalitis and encephalomyelitis: Secondary | ICD-10-CM | POA: Diagnosis present

## 2019-02-22 DIAGNOSIS — R443 Hallucinations, unspecified: Secondary | ICD-10-CM | POA: Diagnosis not present

## 2019-02-22 DIAGNOSIS — R4182 Altered mental status, unspecified: Secondary | ICD-10-CM | POA: Diagnosis not present

## 2019-02-22 DIAGNOSIS — Z8673 Personal history of transient ischemic attack (TIA), and cerebral infarction without residual deficits: Secondary | ICD-10-CM | POA: Diagnosis not present

## 2019-02-22 DIAGNOSIS — R441 Visual hallucinations: Secondary | ICD-10-CM | POA: Diagnosis present

## 2019-02-22 DIAGNOSIS — E1165 Type 2 diabetes mellitus with hyperglycemia: Secondary | ICD-10-CM | POA: Diagnosis present

## 2019-02-22 DIAGNOSIS — Z79899 Other long term (current) drug therapy: Secondary | ICD-10-CM | POA: Diagnosis not present

## 2019-02-22 DIAGNOSIS — E039 Hypothyroidism, unspecified: Secondary | ICD-10-CM | POA: Diagnosis present

## 2019-02-22 DIAGNOSIS — M069 Rheumatoid arthritis, unspecified: Secondary | ICD-10-CM | POA: Diagnosis present

## 2019-02-22 DIAGNOSIS — T394X5A Adverse effect of antirheumatics, not elsewhere classified, initial encounter: Secondary | ICD-10-CM | POA: Diagnosis present

## 2019-02-22 DIAGNOSIS — Z6835 Body mass index (BMI) 35.0-35.9, adult: Secondary | ICD-10-CM | POA: Diagnosis not present

## 2019-02-22 DIAGNOSIS — E11649 Type 2 diabetes mellitus with hypoglycemia without coma: Secondary | ICD-10-CM | POA: Diagnosis not present

## 2019-02-22 DIAGNOSIS — G4700 Insomnia, unspecified: Secondary | ICD-10-CM | POA: Diagnosis present

## 2019-02-22 DIAGNOSIS — F309 Manic episode, unspecified: Secondary | ICD-10-CM | POA: Diagnosis not present

## 2019-02-22 DIAGNOSIS — R7982 Elevated C-reactive protein (CRP): Secondary | ICD-10-CM | POA: Diagnosis present

## 2019-02-22 DIAGNOSIS — I10 Essential (primary) hypertension: Secondary | ICD-10-CM | POA: Diagnosis present

## 2019-02-22 DIAGNOSIS — Z7902 Long term (current) use of antithrombotics/antiplatelets: Secondary | ICD-10-CM | POA: Diagnosis not present

## 2019-02-22 DIAGNOSIS — G9340 Encephalopathy, unspecified: Secondary | ICD-10-CM | POA: Diagnosis present

## 2019-02-22 DIAGNOSIS — Z794 Long term (current) use of insulin: Secondary | ICD-10-CM | POA: Diagnosis not present

## 2019-02-22 LAB — URINALYSIS, ROUTINE W REFLEX MICROSCOPIC
Bacteria, UA: NONE SEEN
Bilirubin Urine: NEGATIVE
Glucose, UA: 50 mg/dL — AB
Ketones, ur: 5 mg/dL — AB
Leukocytes,Ua: NEGATIVE
Nitrite: NEGATIVE
Protein, ur: NEGATIVE mg/dL
Specific Gravity, Urine: 1.006 (ref 1.005–1.030)
pH: 8 (ref 5.0–8.0)

## 2019-02-22 LAB — COMPREHENSIVE METABOLIC PANEL
ALT: 64 U/L — ABNORMAL HIGH (ref 0–44)
AST: 65 U/L — ABNORMAL HIGH (ref 15–41)
Albumin: 3.7 g/dL (ref 3.5–5.0)
Alkaline Phosphatase: 103 U/L (ref 38–126)
Anion gap: 16 — ABNORMAL HIGH (ref 5–15)
BUN: 7 mg/dL (ref 6–20)
CO2: 20 mmol/L — ABNORMAL LOW (ref 22–32)
Calcium: 9.4 mg/dL (ref 8.9–10.3)
Chloride: 97 mmol/L — ABNORMAL LOW (ref 98–111)
Creatinine, Ser: 0.65 mg/dL (ref 0.44–1.00)
GFR calc Af Amer: >60 mL/min (ref >60)
GFR calc non Af Amer: >60 mL/min (ref >60)
Glucose, Bld: 184 mg/dL — ABNORMAL HIGH (ref 70–99)
Potassium: 3.8 mmol/L (ref 3.5–5.1)
Sodium: 133 mmol/L — ABNORMAL LOW (ref 135–145)
Total Bilirubin: 0.7 mg/dL (ref 0.3–1.2)
Total Protein: 8.8 g/dL — ABNORMAL HIGH (ref 6.5–8.1)

## 2019-02-22 LAB — CBC
HCT: 37.8 % (ref 36.0–46.0)
Hemoglobin: 12 g/dL (ref 12.0–15.0)
MCH: 27.1 pg (ref 26.0–34.0)
MCHC: 31.7 g/dL (ref 30.0–36.0)
MCV: 85.5 fL (ref 80.0–100.0)
Platelets: 400 10*3/uL (ref 150–400)
RBC: 4.42 MIL/uL (ref 3.87–5.11)
RDW: 15.6 % — ABNORMAL HIGH (ref 11.5–15.5)
WBC: 9.6 10*3/uL (ref 4.0–10.5)
nRBC: 0 % (ref 0.0–0.2)

## 2019-02-22 LAB — CBC WITH DIFFERENTIAL/PLATELET
Abs Immature Granulocytes: 0.05 10*3/uL (ref 0.00–0.07)
Basophils Absolute: 0 10*3/uL (ref 0.0–0.1)
Basophils Relative: 0 %
Eosinophils Absolute: 0 10*3/uL (ref 0.0–0.5)
Eosinophils Relative: 0 %
HCT: 40 % (ref 36.0–46.0)
Hemoglobin: 12.9 g/dL (ref 12.0–15.0)
Immature Granulocytes: 1 %
Lymphocytes Relative: 15 %
Lymphs Abs: 1.6 10*3/uL (ref 0.7–4.0)
MCH: 27.2 pg (ref 26.0–34.0)
MCHC: 32.3 g/dL (ref 30.0–36.0)
MCV: 84.4 fL (ref 80.0–100.0)
Monocytes Absolute: 0.6 10*3/uL (ref 0.1–1.0)
Monocytes Relative: 6 %
Neutro Abs: 8.6 10*3/uL — ABNORMAL HIGH (ref 1.7–7.7)
Neutrophils Relative %: 78 %
Platelets: 473 10*3/uL — ABNORMAL HIGH (ref 150–400)
RBC: 4.74 MIL/uL (ref 3.87–5.11)
RDW: 15.3 % (ref 11.5–15.5)
WBC: 10.9 10*3/uL — ABNORMAL HIGH (ref 4.0–10.5)
nRBC: 0 % (ref 0.0–0.2)

## 2019-02-22 LAB — VITAMIN B12: Vitamin B-12: 924 pg/mL — ABNORMAL HIGH (ref 180–914)

## 2019-02-22 LAB — POCT I-STAT EG7
Acid-Base Excess: 2 mmol/L (ref 0.0–2.0)
Bicarbonate: 22.4 mmol/L (ref 20.0–28.0)
Calcium, Ion: 1.08 mmol/L — ABNORMAL LOW (ref 1.15–1.40)
HCT: 42 % (ref 36.0–46.0)
Hemoglobin: 14.3 g/dL (ref 12.0–15.0)
O2 Saturation: 92 %
Potassium: 3.8 mmol/L (ref 3.5–5.1)
Sodium: 134 mmol/L — ABNORMAL LOW (ref 135–145)
TCO2: 23 mmol/L (ref 22–32)
pCO2, Ven: 25.5 mmHg — ABNORMAL LOW (ref 44.0–60.0)
pH, Ven: 7.553 — ABNORMAL HIGH (ref 7.250–7.430)
pO2, Ven: 53 mmHg — ABNORMAL HIGH (ref 32.0–45.0)

## 2019-02-22 LAB — TSH: TSH: 1.696 u[IU]/mL (ref 0.350–4.500)

## 2019-02-22 LAB — AMMONIA: Ammonia: 15 umol/L (ref 9–35)

## 2019-02-22 LAB — SEDIMENTATION RATE: Sed Rate: 42 mm/hr — ABNORMAL HIGH (ref 0–22)

## 2019-02-22 LAB — CREATININE, SERUM
Creatinine, Ser: 0.6 mg/dL (ref 0.44–1.00)
GFR calc Af Amer: >60 mL/min (ref >60)
GFR calc non Af Amer: >60 mL/min (ref >60)

## 2019-02-22 LAB — GLUCOSE, CAPILLARY
Glucose-Capillary: 176 mg/dL — ABNORMAL HIGH (ref 70–99)
Glucose-Capillary: 157 mg/dL — ABNORMAL HIGH (ref 70–99)
Glucose-Capillary: 129 mg/dL — ABNORMAL HIGH (ref 70–99)
Glucose-Capillary: 135 mg/dL — ABNORMAL HIGH (ref 70–99)

## 2019-02-22 LAB — SALICYLATE LEVEL: Salicylate Lvl: <7 mg/dL (ref 2.8–30.0)

## 2019-02-22 LAB — RAPID URINE DRUG SCREEN, HOSP PERFORMED
Amphetamines: NOT DETECTED
Barbiturates: NOT DETECTED
Benzodiazepines: POSITIVE — AB
Cocaine: NOT DETECTED
Opiates: NOT DETECTED
Tetrahydrocannabinol: NOT DETECTED

## 2019-02-22 LAB — C-REACTIVE PROTEIN: CRP: 2.3 mg/dL — ABNORMAL HIGH (ref <1.0)

## 2019-02-22 LAB — PROTIME-INR
INR: 1 (ref 0.8–1.2)
Prothrombin Time: 13.4 seconds (ref 11.4–15.2)

## 2019-02-22 LAB — ACETAMINOPHEN LEVEL: Acetaminophen (Tylenol), Serum: <10 ug/mL — ABNORMAL LOW (ref 10–30)

## 2019-02-22 LAB — LACTIC ACID, PLASMA
Lactic Acid, Venous: 1.6 mmol/L (ref 0.5–1.9)
Lactic Acid, Venous: 1.9 mmol/L (ref 0.5–1.9)

## 2019-02-22 LAB — I-STAT BETA HCG BLOOD, ED (MC, WL, AP ONLY): I-stat hCG, quantitative: <5 m[IU]/mL (ref <5)

## 2019-02-22 LAB — ETHANOL: Alcohol, Ethyl (B): <10 mg/dL (ref <10)

## 2019-02-22 LAB — SARS CORONAVIRUS 2 BY RT PCR (HOSPITAL ORDER, PERFORMED IN ~~LOC~~ HOSPITAL LAB): SARS Coronavirus 2: NEGATIVE

## 2019-02-22 MED ORDER — METHOTREXATE 2.5 MG PO TABS
20.0000 mg | ORAL_TABLET | Freq: Once | ORAL | Status: AC
  Administered 2019-02-22: 20 mg via ORAL
  Filled 2019-02-22: qty 8

## 2019-02-22 MED ORDER — SODIUM CHLORIDE 0.9 % IV BOLUS (SEPSIS)
1000.0000 mL | Freq: Once | INTRAVENOUS | Status: AC
  Administered 2019-02-22: 1000 mL via INTRAVENOUS

## 2019-02-22 MED ORDER — METOPROLOL TARTRATE 5 MG/5ML IV SOLN
5.0000 mg | Freq: Once | INTRAVENOUS | Status: AC
  Administered 2019-02-22: 5 mg via INTRAVENOUS
  Filled 2019-02-22: qty 5

## 2019-02-22 MED ORDER — LORAZEPAM 2 MG/ML IJ SOLN
1.0000 mg | Freq: Four times a day (QID) | INTRAMUSCULAR | Status: DC | PRN
  Administered 2019-02-22 – 2019-03-01 (×5): 1 mg via INTRAVENOUS
  Filled 2019-02-22 (×5): qty 1

## 2019-02-22 MED ORDER — LEVOTHYROXINE SODIUM 75 MCG PO TABS
75.0000 ug | ORAL_TABLET | Freq: Every day | ORAL | Status: DC
  Administered 2019-02-23 – 2019-03-04 (×10): 75 ug via ORAL
  Filled 2019-02-22 (×11): qty 1

## 2019-02-22 MED ORDER — INSULIN ASPART 100 UNIT/ML ~~LOC~~ SOLN
0.0000 [IU] | Freq: Three times a day (TID) | SUBCUTANEOUS | Status: DC
  Administered 2019-02-22 (×2): 4 [IU] via SUBCUTANEOUS
  Administered 2019-02-22 – 2019-02-23 (×3): 3 [IU] via SUBCUTANEOUS
  Administered 2019-02-23: 11 [IU] via SUBCUTANEOUS
  Administered 2019-02-24: 7 [IU] via SUBCUTANEOUS

## 2019-02-22 MED ORDER — POTASSIUM CHLORIDE IN NACL 20-0.9 MEQ/L-% IV SOLN
INTRAVENOUS | Status: DC
  Administered 2019-02-22 – 2019-03-01 (×7): via INTRAVENOUS
  Filled 2019-02-22 (×7): qty 1000

## 2019-02-22 MED ORDER — METOPROLOL TARTRATE 5 MG/5ML IV SOLN
INTRAVENOUS | Status: AC
  Filled 2019-02-22: qty 5

## 2019-02-22 MED ORDER — METOPROLOL TARTRATE 5 MG/5ML IV SOLN
5.0000 mg | Freq: Once | INTRAVENOUS | Status: AC
  Administered 2019-02-22: 5 mg via INTRAVENOUS

## 2019-02-22 MED ORDER — BOOST / RESOURCE BREEZE PO LIQD CUSTOM
1.0000 | Freq: Two times a day (BID) | ORAL | Status: DC
  Administered 2019-02-22 – 2019-03-03 (×17): 1 via ORAL

## 2019-02-22 MED ORDER — ACETAMINOPHEN 500 MG PO TABS: 500.0000 mg | ORAL_TABLET | ORAL | Status: DC | PRN

## 2019-02-22 MED ORDER — POTASSIUM CHLORIDE CRYS ER 10 MEQ PO TBCR
10.0000 meq | EXTENDED_RELEASE_TABLET | Freq: Every day | ORAL | Status: DC
  Administered 2019-02-22 – 2019-03-01 (×8): 10 meq via ORAL
  Filled 2019-02-22 (×8): qty 1

## 2019-02-22 MED ORDER — METOPROLOL SUCCINATE ER 50 MG PO TB24
50.0000 mg | ORAL_TABLET | Freq: Every day | ORAL | Status: DC
  Administered 2019-02-22 – 2019-03-04 (×11): 50 mg via ORAL
  Filled 2019-02-22 (×11): qty 1

## 2019-02-22 MED ORDER — FAMOTIDINE 20 MG PO TABS
20.0000 mg | ORAL_TABLET | Freq: Two times a day (BID) | ORAL | Status: DC
  Administered 2019-02-22 – 2019-03-01 (×14): 20 mg via ORAL
  Filled 2019-02-22 (×16): qty 1

## 2019-02-22 MED ORDER — ADULT MULTIVITAMIN W/MINERALS CH
1.0000 | ORAL_TABLET | Freq: Every day | ORAL | Status: DC
  Administered 2019-02-22 – 2019-03-04 (×11): 1 via ORAL
  Filled 2019-02-22 (×11): qty 1

## 2019-02-22 MED ORDER — HALOPERIDOL LACTATE 5 MG/ML IJ SOLN
1.0000 mg | Freq: Four times a day (QID) | INTRAMUSCULAR | Status: DC | PRN
  Administered 2019-02-24 – 2019-02-25 (×3): 1 mg via INTRAVENOUS
  Filled 2019-02-22 (×3): qty 1

## 2019-02-22 MED ORDER — PANTOPRAZOLE SODIUM 40 MG PO TBEC
40.0000 mg | DELAYED_RELEASE_TABLET | Freq: Every day | ORAL | Status: DC
  Administered 2019-02-22 – 2019-03-04 (×11): 40 mg via ORAL
  Filled 2019-02-22 (×12): qty 1

## 2019-02-22 MED ORDER — ASPIRIN 325 MG PO TABS
325.0000 mg | ORAL_TABLET | Freq: Every day | ORAL | Status: DC
  Administered 2019-02-22 – 2019-03-04 (×11): 325 mg via ORAL
  Filled 2019-02-22 (×11): qty 1

## 2019-02-22 MED ORDER — QUETIAPINE FUMARATE 25 MG PO TABS
25.0000 mg | ORAL_TABLET | Freq: Two times a day (BID) | ORAL | Status: DC
  Administered 2019-02-22 – 2019-02-23 (×3): 25 mg via ORAL
  Filled 2019-02-22 (×3): qty 1

## 2019-02-22 MED ORDER — IRBESARTAN 300 MG PO TABS
150.0000 mg | ORAL_TABLET | Freq: Every day | ORAL | Status: DC
  Administered 2019-02-22 – 2019-03-04 (×11): 150 mg via ORAL
  Filled 2019-02-22 (×11): qty 1

## 2019-02-22 MED ORDER — AMLODIPINE BESYLATE 5 MG PO TABS
5.0000 mg | ORAL_TABLET | Freq: Every evening | ORAL | Status: DC
  Administered 2019-02-22 – 2019-03-01 (×8): 5 mg via ORAL
  Filled 2019-02-22 (×8): qty 1

## 2019-02-22 MED ORDER — OXYCODONE HCL 5 MG PO TABS
5.0000 mg | ORAL_TABLET | Freq: Once | ORAL | Status: DC
  Filled 2019-02-22 (×4): qty 1

## 2019-02-22 MED ORDER — POLYETHYLENE GLYCOL 3350 17 G PO PACK
17.0000 g | PACK | Freq: Every day | ORAL | Status: DC
  Administered 2019-02-22 – 2019-03-02 (×9): 17 g via ORAL
  Filled 2019-02-22 (×10): qty 1

## 2019-02-22 MED ORDER — DOCUSATE SODIUM 100 MG PO CAPS
100.0000 mg | ORAL_CAPSULE | Freq: Two times a day (BID) | ORAL | Status: DC
  Administered 2019-02-22 – 2019-03-04 (×20): 100 mg via ORAL
  Filled 2019-02-22 (×22): qty 1

## 2019-02-22 MED ORDER — HEPARIN SODIUM (PORCINE) 5000 UNIT/ML IJ SOLN
5000.0000 [IU] | Freq: Three times a day (TID) | INTRAMUSCULAR | Status: DC
  Administered 2019-02-22 – 2019-03-03 (×30): 5000 [IU] via SUBCUTANEOUS
  Filled 2019-02-22 (×30): qty 1

## 2019-02-22 MED ORDER — GUAIFENESIN-DM 100-10 MG/5ML PO SYRP
5.0000 mL | ORAL_SOLUTION | ORAL | Status: DC | PRN
  Administered 2019-02-22: 5 mL via ORAL
  Filled 2019-02-22: qty 5

## 2019-02-22 MED ORDER — FOLIC ACID 1 MG PO TABS
2.0000 mg | ORAL_TABLET | Freq: Every day | ORAL | Status: DC
  Administered 2019-02-22 – 2019-03-04 (×11): 2 mg via ORAL
  Filled 2019-02-22 (×11): qty 2

## 2019-02-22 MED ORDER — METFORMIN HCL 500 MG PO TABS
1000.0000 mg | ORAL_TABLET | Freq: Two times a day (BID) | ORAL | Status: DC
  Administered 2019-02-22 – 2019-02-28 (×13): 1000 mg via ORAL
  Filled 2019-02-22 (×13): qty 2

## 2019-02-22 MED ORDER — MECLIZINE HCL 25 MG PO TABS: 25.0000 mg | ORAL_TABLET | Freq: Three times a day (TID) | ORAL | Status: DC | PRN

## 2019-02-22 MED ORDER — CLOPIDOGREL BISULFATE 75 MG PO TABS
75.0000 mg | ORAL_TABLET | Freq: Every day | ORAL | Status: DC
  Administered 2019-02-22 – 2019-03-04 (×11): 75 mg via ORAL
  Filled 2019-02-22 (×11): qty 1

## 2019-02-22 NOTE — Progress Notes (Signed)
Pt arrived to 5w35. Transferred from stretcher to bed and identified appropriately. Pt speaks Gujarati, and daughter in law at the bedside. Pt has difficulty focusing, cannot fallow command or look at the translator screen. Lethargic at this time. Her daughter in law needs to ask question multiple times in order for pt to answer. No signs of distress, VS stable, pt very irritable with care. Alert and oriented to self only. Cardiac monitor in place and CCMD notified. No belongings at the bedside. Daughter in law instructed to use call bell for assistance and call bell left with in reach. Bed alarm in place.

## 2019-02-22 NOTE — ED Notes (Signed)
Dr Kadakia at bedside 

## 2019-02-22 NOTE — H&P (Signed)
Referring Physician:  [name] is an 58 y.o. female.                       Chief Complaint: Altered mental status  HPI: 58 year old female with recent admission for altered mental status and possible viral encephalitis, recent lacunar infarct of right corona radiata, had behavior changes with severe combativeness and confusion. She has Rheumatoid arthritis, type 2 DM, hypertension, obesity and hypothyroidism, She was gradually tapered off of steroids over 3 months when she was placed on Humira injections q 2 weeks. Post Haldol and lorazepam she is sedated and sleepy but periodically extends arms to catch something so she can reposition her body. Prior to discharge home today after 9 days in hospital and work up by neurology and infectious disease she had ambulated well, fed herself but had some confusion at night time during hospital stay. She was scheduled to go to inpatient rehab for weakness and had changed her mind over weekend to go home.  Past Medical History:  Diagnosis Date  . Arthritis   . Diabetes (HCC)   . High blood pressure       Past Surgical History:  Procedure Laterality Date  . IR ANGIO EXTERNAL CAROTID SEL EXT CAROTID UNI R MOD SED  02/16/2019  . IR ANGIO INTRA EXTRACRAN SEL COM CAROTID INNOMINATE BILAT MOD SED  02/16/2019  . IR ANGIO VERTEBRAL SEL VERTEBRAL BILAT MOD SED  02/16/2019  . RADIOLOGY WITH ANESTHESIA N/A 02/15/2019   Procedure: IR WITH ANESTHESIA;  Surgeon: Julieanne Cotton, MD;  Location: [facility] OR;  Service: Radiology;  Laterality: N/A;    Family History  Problem Relation Age of Onset  . Hypertension Mother    Social History:  reports that she has never smoked. She has never used smokeless tobacco. She reports that she does not drink alcohol or use drugs.  Allergies: No Known Allergies  (Not in a hospital admission)   Results for orders placed or performed during the hospital encounter of 02/21/19 (from the past 48 hour(s))  TSH     Status: None   Collection Time: 02/21/19 11:27 PM  Result Value Ref Range   TSH 1.696 0.350 - 4.500 uIU/mL    Comment: Performed by a 3rd Generation assay with a functional sensitivity of <=0.01 uIU/mL. Performed at [facility], [address], [place], [place] [address]   Comprehensive metabolic panel     Status: Abnormal   Collection Time: 02/21/19 11:45 PM  Result Value Ref Range   Sodium 133 (L) 135 - 145 mmol/L   Potassium 3.8 3.5 - 5.1 mmol/L   Chloride 97 (L) 98 - 111 mmol/L   CO2 20 (L) 22 - 32 mmol/L   Glucose, Bld 184 (H) 70 - 99 mg/dL   BUN 7 6 - 20 mg/dL   Creatinine, Ser 6.04 0.44 - 1.00 mg/dL   Calcium 9.4 8.9 - 54.0 mg/dL   Total Protein 8.8 (H) 6.5 - 8.1 g/dL   Albumin 3.7 3.5 - 5.0 g/dL   AST 65 (H) 15 - 41 U/L   ALT 64 (H) 0 - 44 U/L   Alkaline Phosphatase 103 38 - 126 U/L   Total Bilirubin 0.7 0.3 - 1.2 mg/dL   GFR calc non Af Amer >60 >60 mL/min   GFR calc Af Amer >60 >60 mL/min   Anion gap 16 (H) 5 - 15    Comment: Performed at [facility], [address], [place], [place] [address]  CBC with Differential     Status: Abnormal   Collection Time: 02/21/19 11:45 PM  Result Value Ref Range   WBC 10.9 (H) 4.0 - 10.5 K/uL   RBC 4.74 3.87 - 5.11 MIL/uL   Hemoglobin 12.9 12.0 - 15.0 g/dL   HCT 40.0 36.0 - 46.0 %   MCV 84.4 80.0 - 100.0 fL   MCH 27.2 26.0 - 34.0 pg   MCHC 32.3 30.0 - 36.0 g/dL   RDW 15.3 11.5 - 15.5 %   Platelets 473 (H) 150 - 400 K/uL   nRBC 0.0 0.0 - 0.2 %   Neutrophils Relative % 78 %   Neutro Abs 8.6 (H) 1.7 - 7.7 K/uL   Lymphocytes Relative 15 %   Lymphs Abs 1.6 0.7 - 4.0 K/uL   Monocytes Relative 6 %   Monocytes Absolute 0.6 0.1 - 1.0 K/uL   Eosinophils Relative 0 %   Eosinophils Absolute 0.0 0.0 - 0.5 K/uL   Basophils Relative 0 %   Basophils Absolute 0.0 0.0 - 0.1 K/uL   Immature Granulocytes 1 %   Abs Immature Granulocytes 0.05 0.00 - 0.07 K/uL    Comment: Performed at [facility], [address], [place], [place]  [address]  Protime-INR     Status: None   Collection Time: 02/21/19 11:45 PM  Result Value Ref Range   Prothrombin Time 13.4 11.4 - 15.2 seconds   INR 1.0 0.8 - 1.2    Comment: (NOTE) INR goal varies based on device and disease states. Performed at [facility], [place] [address] [place], [place] [address]   Lactic acid, plasma     Status: None   Collection Time: 02/21/19 11:46 PM  Result Value Ref Range   Lactic Acid, Venous 1.9 0.5 - 1.9 mmol/L    Comment: Performed at [facility] [address] [place], [place] [address]  Ammonia     Status: None   Collection Time: 02/21/19 11:46 PM  Result Value Ref Range   Ammonia 15 9 - 35 umol/L    Comment: Performed at [facility], [place] [address], [place], [place] [address]  SARS Coronavirus 2 (CEPHEID - Performed in [place] hospital lab), Hosp Order     Status: None   Collection Time: 02/21/19 11:50 PM   Specimen: Nasopharyngeal Swab  Result Value Ref Range   SARS Coronavirus 2 NEGATIVE NEGATIVE    Comment: (NOTE) If result is NEGATIVE SARS-CoV-2 target nucleic acids are NOT DETECTED. The SARS-CoV-2 RNA is generally detectable in upper and lower  respiratory specimens during the acute phase of infection. The lowest  concentration of SARS-CoV-2 viral copies this assay can detect is 250  copies / mL. A negative result does not preclude SARS-CoV-2 infection  and should not be used as the sole basis for treatment or other  patient management decisions.  A negative result may occur with  improper specimen collection / handling, submission of specimen other  than nasopharyngeal swab, presence of viral mutation(s) within the  areas targeted by this assay, and inadequate number of viral copies  (<250 copies / mL). A negative result must be combined with clinical  observations, patient history, and epidemiological information. If result is POSITIVE SARS-CoV-2 target nucleic acids are DETECTED. The SARS-CoV-2 RNA is  generally detectable in upper and lower  respiratory specimens dur ing the acute phase of infection.  Positive  results are indicative of active infection with  SARS-CoV-2.  Clinical  correlation with patient history and other diagnostic information is  necessary to determine patient infection status.  Positive results do  not rule out bacterial infection or co-infection with other viruses. If result is PRESUMPTIVE POSTIVE SARS-CoV-2 nucleic acids MAY BE PRESENT.   A presumptive positive result was obtained on the submitted specimen  and confirmed on repeat testing.  While 2019 novel coronavirus  (SARS-CoV-2) nucleic acids may be present in the submitted sample  additional confirmatory testing may be necessary for epidemiological  and / or clinical management purposes  to differentiate between  SARS-CoV-2 and other Sarbecovirus currently known to infect humans.  If clinically indicated additional testing with an alternate test  methodology [phone]([id number]) is advised. The SARS-CoV-2 RNA is generally  detectable in upper and lower respiratory sp ecimens during the acute  phase of infection. The expected result is Negative. Fact Sheet for Patients:  BoilerBrush.com.cyhttps://www.fda.gov/media/136312/download Fact Sheet for Healthcare Providers: https://pope.com/https://www.fda.gov/media/136313/download This test is not yet approved or cleared by the [place] FDA and has been authorized for detection and/or diagnosis of SARS-CoV-2 by FDA under an Emergency Use Authorization (EUA).  This EUA will remain in effect (meaning this test can be used) for the duration of the COVID-19 declaration under Section 564(b)(1) of the Act, 21 U.S.C. section 360bbb-3(b)(1), unless the authorization is terminated or revoked sooner. Performed at [facility], [address] [place], [place] [address]   I-Stat beta hCG blood, ED     Status: None   Collection Time: 02/21/19 11:52 PM  Result Value Ref Range   I-stat hCG, quantitative  <5.0 <5 mIU/mL   Comment 3            Comment:   GEST. AGE      CONC.  (mIU/mL)   <=1 WEEK        5 - 50     2 WEEKS       50 - 500     3 WEEKS       100 - 10,000     4 WEEKS     1,000 - 30,000        FEMALE AND NON-PREGNANT FEMALE:     LESS THAN 5 mIU/mL   POCT I-Stat EG7     Status: Abnormal   Collection Time: 02/21/19 11:54 PM  Result Value Ref Range   pH, Ven 7.553 (H) 7.250 - 7.430   pCO2, Ven 25.5 (L) 44.0 - 60.0 mmHg   pO2, Ven 53.0 (H) 32.0 - 45.0 mmHg   Bicarbonate 22.4 20.0 - 28.0 mmol/L   TCO2 23 22 - 32 mmol/L   O2 Saturation 92.0 %   Acid-Base Excess 2.0 0.0 - 2.0 mmol/L   Sodium 134 (L) 135 - 145 mmol/L   Potassium 3.8 3.5 - 5.1 mmol/L   Calcium, Ion 1.08 (L) 1.15 - 1.40 mmol/L   HCT 42.0 36.0 - 46.0 %   Hemoglobin 14.3 12.0 - 15.0 g/dL   Patient temperature HIDE    Sample type VENOUS    Ct Head Wo Contrast  Result Date: 02/22/2019 CLINICAL DATA:  Altered LOC EXAM: CT HEAD WITHOUT CONTRAST TECHNIQUE: Contiguous axial images were obtained from the base of the skull through the vertex without intravenous contrast. COMPARISON:  MRI 02/16/2019, CT brain 02/15/2019 FINDINGS: Brain: No acute hemorrhage or intracranial mass. Hypodensity within the right white matter, corresponding to the previously noted small focus of infarct on MRI 02/16/2019. Stable ventricle size. Scattered hypodensities in the white  matter, presumably small-vessel ischemic change. Vascular: No hyperdense vessels.  Carotid vascular calcification Skull: Normal. Negative for fracture or focal lesion. Sinuses/Orbits: Mucosal thickening in the ethmoid and maxillary sinus Other: None IMPRESSION: 1. Small hypodensity within the right white matter, corresponding to previous MRI demonstrated lacunar infarct. No other acute intracranial abnormality is seen. Electronically Signed   By: Jasmine PangKim  Fujinaga M.D.   On: 02/22/2019 01:18   Dg Chest Port 1 View  Result Date: 02/22/2019 CLINICAL DATA:  Altered mental status  EXAM: PORTABLE CHEST 1 VIEW COMPARISON:  02/18/2019, 02/16/2019 FINDINGS: Low lung volumes. No focal airspace disease or effusion. Normal heart size. No pneumothorax. IMPRESSION: No active disease. Electronically Signed   By: Jasmine PangKim  Fujinaga M.D.   On: 02/22/2019 01:18    Review Of Systems Constitutional: Positive fever, no chills, weight loss or gain. Eyes: No vision change, wears glasses. No discharge or pain. Ears: No hearing loss, No tinnitus. Respiratory: No asthma, COPD, pneumonias. No shortness of breath. No hemoptysis. Cardiovascular: No chest pain, palpitation, leg edema. Gastrointestinal: No nausea, vomiting, diarrhea, positive constipation. No GI bleed. No hepatitis. Genitourinary: No dysuria, hematuria, kidney stone. No incontinance. Neurological: Positive headache, stroke, no seizures.  Psychiatry: No psych facility admission for anxiety, depression, suicide. No detox. Skin: No rash. Musculoskeletal: Positive joint pain, fibromyalgia. Positive neck pain, back pain. Lymphadenopathy: No lymphadenopathy. Hematology: No anemia or easy bruising.   Blood pressure (!) 152/107, pulse (!) 159, temperature 99.1 F (37.3 C), temperature source Rectal, resp. rate (!) 25, height 5\' 3"  (1.6 m), weight 90.1 kg, SpO2 100 %. Body mass index is 35.19 kg/m. General appearance: sedated, cooperative, appears stated age and mildly agitated. Head: Normocephalic, atraumatic. Eyes: Brown eyes, pink conjunctiva, corneas clear.  Neck: No adenopathy, no carotid bruit, no JVD, supple, symmetrical, trachea midline and thyroid not enlarged. Resp: Clear to auscultation bilaterally. Cardio: Tachycardic, Regular rate and rhythm, S1, S2 normal, II/VI systolic murmur, no click, rub or gallop GI: Soft, bowel sounds normal; no organomegaly. Extremities: No edema, cyanosis or clubbing. Skin: Warm and dry.  Neurologic: Alert and oriented X 0, normal strength.   Assessment/Plan Altered mental status Generalized  weakness Rheumatoid arthritis Recent stroke Recent metabolic encephalopathy Hypertension Type 2 DM Obesity Hypothyroidism  Admit. Re-consult neurology, infectious disease and psychiatrist in AM. IV lorazepam as needed.  Time spent: Review of old records, Lab, x-rays, EKG, other cardiac tests, examination, discussion with patient over 70 minutes.  Ricki RodriguezAjay S Curlie Macken, MD  02/22/2019, 1:30 AM

## 2019-02-22 NOTE — Progress Notes (Signed)
Ref: Dixie Dials, MD   Subjective:  Agitated but sedated with pain medication. VS improving with HR in 90-110 bpm. 98 % oxygen saturation.  Elevated CRP. Normal B-12 level. ESR and folate level pending. Acetaminophen level is low.  Objective:  Vital Signs in the last 24 hours: Temp:  [97.4 F (36.3 C)-99.1 F (37.3 C)] 98.5 F (36.9 C) (07/28 0948) Pulse Rate:  [67-159] 96 (07/28 0948) Cardiac Rhythm: Sinus tachycardia (07/28 0900) Resp:  [16-30] 20 (07/28 0602) BP: (146-177)/(68-136) 177/78 (07/28 0948) SpO2:  [96 %-100 %] 98 % (07/28 0948) Weight:  [82.4 kg-90.1 kg] 82.4 kg (07/28 0417)  Physical Exam: BP Readings from Last 1 Encounters:  02/22/19 (!) 177/78     Wt Readings from Last 1 Encounters:  02/22/19 82.4 kg    Weight change:  Body mass index is 32.18 kg/m. HEENT: [place]/AT, Eyes-Brown, PERL, EOMI, Conjunctiva-Pink, Sclera-Non-icteric Neck: No JVD, No bruit, Trachea midline. Lungs:  Clear, Bilateral. Cardiac:  Regular rhythm, normal S1 and S2, no S3. II/VI systolic murmur. Abdomen:  Soft, non-tender. BS present. Extremities:  No edema present. No cyanosis. No clubbing. CNS: AxOx1, Cranial nerves grossly intact, moves all 4 extremities.  Skin: Warm and dry.   Intake/Output from previous day: 07/27 0701 - 07/28 0700 In: 2000 [IV Piggyback:2000] Out: 300 [Urine:300]    Lab Results: BMET    Component Value Date/Time   NA 134 (L) 02/21/2019 2354   NA 133 (L) 02/21/2019 2345   NA 135 02/21/2019 0548   NA 139 11/30/2014 1001   K 3.8 02/21/2019 2354   K 3.8 02/21/2019 2345   K 3.6 02/21/2019 0548   CL 97 (L) 02/21/2019 2345   CL 102 02/21/2019 0548   CL 95 (L) 02/20/2019 1120   CO2 20 (L) 02/21/2019 2345   CO2 24 02/21/2019 0548   CO2 25 02/20/2019 1120   GLUCOSE 184 (H) 02/21/2019 2345   GLUCOSE 152 (H) 02/21/2019 0548   GLUCOSE 162 (H) 02/20/2019 1120   BUN 7 02/21/2019 2345   BUN 6 02/21/2019 0548   BUN 5 (L) 02/20/2019 1120   BUN 11 11/30/2014  1001   CREATININE 0.60 02/22/2019 0825   CREATININE 0.65 02/21/2019 2345   CREATININE 0.55 02/21/2019 0548   CALCIUM 9.4 02/21/2019 2345   CALCIUM 8.9 02/21/2019 0548   CALCIUM 8.9 02/20/2019 1120   GFRNONAA >60 02/22/2019 0825   GFRNONAA >60 02/21/2019 2345   GFRNONAA >60 02/21/2019 0548   GFRAA >60 02/22/2019 0825   GFRAA >60 02/21/2019 2345   GFRAA >60 02/21/2019 0548   CBC    Component Value Date/Time   WBC 9.6 02/22/2019 0825   RBC 4.42 02/22/2019 0825   HGB 12.0 02/22/2019 0825   HGB 12.5 11/30/2014 1001   HCT 37.8 02/22/2019 0825   HCT 38.6 11/30/2014 1001   PLT 400 02/22/2019 0825   PLT 369 11/30/2014 1001   MCV 85.5 02/22/2019 0825   MCV 83 11/30/2014 1001   MCH 27.1 02/22/2019 0825   MCHC 31.7 02/22/2019 0825   RDW 15.6 (H) 02/22/2019 0825   RDW 14.1 11/30/2014 1001   LYMPHSABS 1.6 02/21/2019 2345   LYMPHSABS 2.2 11/30/2014 1001   MONOABS 0.6 02/21/2019 2345   EOSABS 0.0 02/21/2019 2345   EOSABS 0.2 11/30/2014 1001   BASOSABS 0.0 02/21/2019 2345   BASOSABS 0.0 11/30/2014 1001   HEPATIC Function Panel Recent Labs    02/12/19 1857 02/18/19 0459 02/21/19 2345  PROT 8.1 6.7 8.8*   HEMOGLOBIN A1C  No components found for: HGA1C,  MPG CARDIAC ENZYMES Lab Results  Component Value Date   CKTOTAL 18 (L) 02/14/2019   CKMB 0.9 02/14/2019   BNP No results for input(s): PROBNP in the last 8760 hours. TSH Recent Labs    02/13/19 0629 02/21/19 2327  TSH 0.367 1.696   CHOLESTEROL Recent Labs    02/16/19 0639  CHOL 167    Scheduled Meds: . amLODipine  5 mg Oral QPM  . aspirin  325 mg Oral Daily  . clopidogrel  75 mg Oral Daily  . docusate sodium  100 mg Oral BID  . famotidine  20 mg Oral BID  . folic acid  2 mg Oral Daily  . heparin  5,000 Units Subcutaneous Q8H  . insulin aspart  0-20 Units Subcutaneous TID WC  . irbesartan  150 mg Oral Daily  . [START ON 02/23/2019] levothyroxine  75 mcg Oral Daily  . metFORMIN  1,000 mg Oral BID WC  .  metoprolol succinate  50 mg Oral Daily  . multivitamin with minerals  1 tablet Oral Daily  . oxyCODONE  5 mg Oral Once  . pantoprazole  40 mg Oral Daily  . polyethylene glycol  17 g Oral Daily  . potassium chloride  10 mEq Oral Daily   Continuous Infusions: . 0.9 % NaCl with KCl 20 mEq / L 50 mL/hr at 02/22/19 0532   PRN Meds:.acetaminophen, haloperidol lactate, LORazepam, meclizine  Assessment/Plan: Altered mental status Generalized weakness Rheumatoid arthritis  Recent Corona radiata lacunar infarct Possible viral encephalitis Recent metabolic encephalopathy Hypertension Type 2 DM Obesity Hypothyroidism  Consult rheumatology.   LOS: 0 days   Time spent including chart review, lab review, examination, discussion with patient : 40 min   Dixie Dials  MD  02/22/2019, 9:55 AM

## 2019-02-22 NOTE — ED Notes (Signed)
ED TO INPATIENT HANDOFF REPORT  ED Nurse Name and Phone #: [phone]  S Name/Age/Gender Victoria Cox 58 y.o. female Room/Bed: 032C/032C  Code Status   Code Status: Full Code  Home/SNF/Other Home {Patient oriented to none  Is this baseline? No   Triage Complete: Triage complete  Chief Complaint AMS  Triage Note  Pt BIB [facility] For eval of worsening AMS since she was d/c'd this afternoon. 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 Daughter in law reports that she was slightly altered on DC, however this evening she became acutely combative, much more altered. Rec'd 10mg  Versed IM w/ minimal effect.    Allergies No Known Allergies  Level of Care/Admitting Diagnosis ED Disposition    ED Disposition Condition Comment   Admit  [facility]ospital [facility] [[id number]]  Level of Care: Telemetry Medical [104]  Covid Evaluation: Confirmed COVID Negative  Diagnosis: Altered mental status [780.97.ICD-9-CM]  Admitting Physician: Orpah CobbKADAKIA, AJAY [[id number]]  Attending Physician: Orpah CobbKADAKIA, AJAY [[id number]]  Estimated length of stay: past midnight tomorrow  Certification:: I certify this patient will need inpatient services for at least 2 midnights  PT Class (Do Not Modify): Inpatient [101]  PT Acc Code (Do Not Modify): Private [1]       B Medical/Surgery History Past Medical History:  Diagnosis Date  . Arthritis   . Diabetes (HCC)   . High blood pressure    Past Surgical History:  Procedure Laterality Date  . IR ANGIO EXTERNAL CAROTID SEL EXT CAROTID UNI R MOD SED  02/16/2019  . IR ANGIO INTRA EXTRACRAN SEL COM CAROTID INNOMINATE BILAT MOD SED  02/16/2019  . IR ANGIO VERTEBRAL SEL VERTEBRAL BILAT MOD SED  02/16/2019  . RADIOLOGY WITH ANESTHESIA N/A 02/15/2019   Procedure: IR WITH ANESTHESIA;  Surgeon: Julieanne Cottoneveshwar, Sanjeev, MD;  Location: [facility] OR;  Service: Radiology;  Laterality: N/A;     A IV Location/Drains/Wounds Patient Lines/Drains/Airways Status   Active  Line/Drains/Airways    Name:   Placement date:   Placement time:   Site:   Days:   Peripheral IV 02/21/19 Left Forearm   02/21/19    2354    Forearm   1   Incision (Closed) 02/16/19 Groin Right   02/16/19    0800     6   Wound / Incision (Open or Dehisced) 02/17/19 Other (Comment) Arm Left   02/17/19    -    Arm   5          Intake/Output Last 24 hours No intake or output data in the 24 hours ending 02/22/19 0211  Labs/Imaging Results for orders placed or performed during the hospital encounter of 02/21/19 (from the past 48 hour(s))  TSH     Status: None   Collection Time: 02/21/19 11:27 PM  Result Value Ref Range   TSH 1.696 0.350 - 4.500 uIU/mL    Comment: Performed by a 3rd Generation assay with a functional sensitivity of <=0.01 uIU/mL. Performed at [facility], [address] [place], [place] [address]   Comprehensive metabolic panel     Status: Abnormal   Collection Time: 02/21/19 11:45 PM  Result Value Ref Range   Sodium 133 (L) 135 - 145 mmol/L   Potassium 3.8 3.5 - 5.1 mmol/L   Chloride 97 (L) 98 - 111 mmol/L   CO2 20 (L) 22 - 32 mmol/L   Glucose, Bld 184 (H) 70 - 99 mg/dL   BUN 7 6 - 20 mg/dL  Creatinine, Ser 0.65 0.44 - 1.00 mg/dL   Calcium 9.4 8.9 - 33.2 mg/dL   Total Protein 8.8 (H) 6.5 - 8.1 g/dL   Albumin 3.7 3.5 - 5.0 g/dL   AST 65 (H) 15 - 41 U/L   ALT 64 (H) 0 - 44 U/L   Alkaline Phosphatase 103 38 - 126 U/L   Total Bilirubin 0.7 0.3 - 1.2 mg/dL   GFR calc non Af Amer >60 >60 mL/min   GFR calc Af Amer >60 >60 mL/min   Anion gap 16 (H) 5 - 15    Comment: Performed at [facility], [address] [place], [place] [address]  CBC with Differential     Status: Abnormal   Collection Time: 02/21/19 11:45 PM  Result Value Ref Range   WBC 10.9 (H) 4.0 - 10.5 K/uL   RBC 4.74 3.87 - 5.11 MIL/uL   Hemoglobin 12.9 12.0 - 15.0 g/dL   HCT 41.6 60.6 - 30.1 %   MCV 84.4 80.0 - 100.0 fL   MCH 27.2 26.0 - 34.0 pg   MCHC 32.3 30.0 - 36.0 g/dL   RDW  60.1 09.3 - 23.5 %   Platelets 473 (H) 150 - 400 K/uL   nRBC 0.0 0.0 - 0.2 %   Neutrophils Relative % 78 %   Neutro Abs 8.6 (H) 1.7 - 7.7 K/uL   Lymphocytes Relative 15 %   Lymphs Abs 1.6 0.7 - 4.0 K/uL   Monocytes Relative 6 %   Monocytes Absolute 0.6 0.1 - 1.0 K/uL   Eosinophils Relative 0 %   Eosinophils Absolute 0.0 0.0 - 0.5 K/uL   Basophils Relative 0 %   Basophils Absolute 0.0 0.0 - 0.1 K/uL   Immature Granulocytes 1 %   Abs Immature Granulocytes 0.05 0.00 - 0.07 K/uL    Comment: Performed at [facility], [address], [place], [place] [address]  Protime-INR     Status: None   Collection Time: 02/21/19 11:45 PM  Result Value Ref Range   Prothrombin Time 13.4 11.4 - 15.2 seconds   INR 1.0 0.8 - 1.2    Comment: (NOTE) INR goal varies based on device and disease states. Performed at [facility], [address], [place], [place] [address]   Lactic acid, plasma     Status: None   Collection Time: 02/21/19 11:46 PM  Result Value Ref Range   Lactic Acid, Venous 1.9 0.5 - 1.9 mmol/L    Comment: Performed at [facility], [address] [place], [place] [address]  Ammonia     Status: None   Collection Time: 02/21/19 11:46 PM  Result Value Ref Range   Ammonia 15 9 - 35 umol/L    Comment: Performed at [facility], [address], [place], [place] [address]  SARS Coronavirus 2 (CEPHEID - Performed in [facility] hospital lab), Hosp Order     Status: None   Collection Time: 02/21/19 11:50 PM   Specimen: Nasopharyngeal Swab  Result Value Ref Range   SARS Coronavirus 2 NEGATIVE NEGATIVE    Comment: (NOTE) If result is NEGATIVE SARS-CoV-2 target nucleic acids are NOT DETECTED. The SARS-CoV-2 RNA is generally detectable in upper and lower  respiratory specimens during the acute phase of infection. The lowest  concentration of SARS-CoV-2 viral copies this assay can detect is 250  copies / mL. A negative result does not preclude SARS-CoV-2 infection   and should not be used as  the sole basis for treatment or other  patient management decisions.  A negative result may occur with  improper specimen collection / handling, submission of specimen other  than nasopharyngeal swab, presence of viral mutation(s) within the  areas targeted by this assay, and inadequate number of viral copies  (<250 copies / mL). A negative result must be combined with clinical  observations, patient history, and epidemiological information. If result is POSITIVE SARS-CoV-2 target nucleic acids are DETECTED. The SARS-CoV-2 RNA is generally detectable in upper and lower  respiratory specimens dur ing the acute phase of infection.  Positive  results are indicative of active infection with SARS-CoV-2.  Clinical  correlation with patient history and other diagnostic information is  necessary to determine patient infection status.  Positive results do  not rule out bacterial infection or co-infection with other viruses. If result is PRESUMPTIVE POSTIVE SARS-CoV-2 nucleic acids MAY BE PRESENT.   A presumptive positive result was obtained on the submitted specimen  and confirmed on repeat testing.  While 2019 novel coronavirus  (SARS-CoV-2) nucleic acids may be present in the submitted sample  additional confirmatory testing may be necessary for epidemiological  and / or clinical management purposes  to differentiate between  SARS-CoV-2 and other Sarbecovirus currently known to infect humans.  If clinically indicated additional testing with an alternate test  methodology [phone]) is advised. The SARS-CoV-2 RNA is generally  detectable in upper and lower respiratory sp ecimens during the acute  phase of infection. The expected result is Negative. Fact Sheet for Patients:  StrictlyIdeas.no Fact Sheet for Healthcare Providers: BankingDealers.co.za This test is not yet approved or cleared by the [place] FDA  and has been authorized for detection and/or diagnosis of SARS-CoV-2 by FDA under an Emergency Use Authorization (EUA).  This EUA will remain in effect (meaning this test can be used) for the duration of the COVID-19 declaration under Section 564(b)(1) of the Act, 21 U.S.C. section 360bbb-3(b)(1), unless the authorization is terminated or revoked sooner. Performed at [facility], [place] [address], [place], [place] [address]   I-Stat beta hCG blood, ED     Status: None   Collection Time: 02/21/19 11:52 PM  Result Value Ref Range   I-stat hCG, quantitative <5.0 <5 mIU/mL   Comment 3            Comment:   GEST. AGE      CONC.  (mIU/mL)   <=1 WEEK        5 - 50     2 WEEKS       50 - 500     3 WEEKS       100 - 10,000     4 WEEKS     1,000 - 30,000        FEMALE AND NON-PREGNANT FEMALE:     LESS THAN 5 mIU/mL   POCT I-Stat EG7     Status: Abnormal   Collection Time: 02/21/19 11:54 PM  Result Value Ref Range   pH, Ven 7.553 (H) 7.250 - 7.430   pCO2, Ven 25.5 (L) 44.0 - 60.0 mmHg   pO2, Ven 53.0 (H) 32.0 - 45.0 mmHg   Bicarbonate 22.4 20.0 - 28.0 mmol/L   TCO2 23 22 - 32 mmol/L   O2 Saturation 92.0 %   Acid-Base Excess 2.0 0.0 - 2.0 mmol/L   Sodium 134 (L) 135 - 145 mmol/L   Potassium 3.8 3.5 - 5.1 mmol/L   Calcium, Ion 1.08 (L) 1.15 -  1.40 mmol/L   HCT 42.0 36.0 - 46.0 %   Hemoglobin 14.3 12.0 - 15.0 g/dL   Patient temperature HIDE    Sample type VENOUS    Ct Head Wo Contrast  Result Date: 02/22/2019 CLINICAL DATA:  Altered LOC EXAM: CT HEAD WITHOUT CONTRAST TECHNIQUE: Contiguous axial images were obtained from the base of the skull through the vertex without intravenous contrast. COMPARISON:  MRI 02/16/2019, CT brain 02/15/2019 FINDINGS: Brain: No acute hemorrhage or intracranial mass. Hypodensity within the right white matter, corresponding to the previously noted small focus of infarct on MRI 02/16/2019. Stable ventricle size. Scattered hypodensities in the white matter,  presumably small-vessel ischemic change. Vascular: No hyperdense vessels.  Carotid vascular calcification Skull: Normal. Negative for fracture or focal lesion. Sinuses/Orbits: Mucosal thickening in the ethmoid and maxillary sinus Other: None IMPRESSION: 1. Small hypodensity within the right white matter, corresponding to previous MRI demonstrated lacunar infarct. No other acute intracranial abnormality is seen. Electronically Signed   By: Jasmine Pang M.D.   On: 02/22/2019 01:18   Dg Chest Port 1 View  Result Date: 02/22/2019 CLINICAL DATA:  Altered mental status EXAM: PORTABLE CHEST 1 VIEW COMPARISON:  02/18/2019, 02/16/2019 FINDINGS: Low lung volumes. No focal airspace disease or effusion. Normal heart size. No pneumothorax. IMPRESSION: No active disease. Electronically Signed   By: Jasmine Pang M.D.   On: 02/22/2019 01:18    Pending Labs Unresulted Labs (From admission, onward)    Start     Ordered   02/22/19 0500  CBC  Tomorrow morning,   R     02/22/19 0122   02/22/19 0126  C3 complement  Add-on,   AD     02/22/19 0125   02/22/19 0126  C4 complement  Add-on,   AD     02/22/19 0125   02/22/19 0126  Vitamin B12  Add-on,   AD     02/22/19 0125   02/22/19 0125  Folate RBC  Add-on,   AD     02/22/19 0125   02/22/19 0123  Sedimentation rate  Add-on,   AD     02/22/19 0122   02/22/19 0116  Creatinine, serum  (heparin)  Once,   STAT    Comments: Baseline for heparin therapy IF NOT ALREADY DRAWN.    02/22/19 0122   02/21/19 2340  Rapid urine drug screen (hospital performed)  ONCE - STAT,   STAT     02/21/19 2339   02/21/19 2340  Ethanol  ONCE - STAT,   STAT     02/21/19 2339   02/21/19 2340  Salicylate level  ONCE - STAT,   STAT     02/21/19 2339   02/21/19 2340  Acetaminophen level  ONCE - STAT,   STAT     02/21/19 2339   02/21/19 2308  Lactic acid, plasma  Now then every 2 hours,   STAT     02/21/19 2308   02/21/19 2308  Culture, blood (Routine x 2)  BLOOD CULTURE X 2,   STAT      02/21/19 2308   02/21/19 2308  Urinalysis, Routine w reflex microscopic  ONCE - STAT,   STAT     02/21/19 2308          Vitals/Pain Today's Vitals   02/21/19 2345 02/21/19 2354 02/22/19 0015 02/22/19 0206  BP: (!) 152/107   (!) 165/133  Pulse: (!) 159  (!) 127 (!) 138  Resp: (!) 25  16 (!) 23  Temp:  99.1 F (37.3 C)    TempSrc:  Rectal    SpO2: 100%  98% 100%  Weight:      Height:        Isolation Precautions No active isolations  Medications Medications  heparin injection 5,000 Units (has no administration in time range)  0.9 % NaCl with KCl 20 mEq/ L  infusion (has no administration in time range)  sodium chloride 0.9 % bolus 1,000 mL (has no administration in time range)  LORazepam (ATIVAN) injection 1 mg (has no administration in time range)  haloperidol lactate (HALDOL) injection 1 mg (has no administration in time range)  insulin aspart (novoLOG) injection 0-20 Units (has no administration in time range)  haloperidol lactate (HALDOL) injection 5 mg (5 mg Intramuscular Given 02/21/19 2331)  LORazepam (ATIVAN) 2 MG/ML injection (2 mg  Given 02/21/19 2351)  sodium chloride 0.9 % bolus 1,000 mL (1,000 mLs Intravenous New Bag/Given 02/22/19 0058)    Mobility walks High fall risk   Focused Assessments Neuro Assessment Handoff:  Swallow screen pass? n/a         Neuro Assessment:   Neuro Checks:      Last Documented NIHSS Modified Score:   Has TPA been given? No If patient is a Neuro Trauma and patient is going to OR before floor call report to 4N Charge nurse: [phone] or [phone]     R Recommendations: See Admitting Provider Note  Report given to:   Additional Notes:

## 2019-02-22 NOTE — ED Notes (Signed)
Full linen change d/t bedpan spill in bed, pt continues to be resistive to care measures.

## 2019-02-22 NOTE — Progress Notes (Signed)
Pt HR elevated at 130-140. Doylene Canard, MD notified, Metoprolol 5mg  IV ordered.

## 2019-02-22 NOTE — Progress Notes (Signed)
Initial Nutrition Assessment  RD working remotely.  DOCUMENTATION CODES:   Obesity unspecified  INTERVENTION:   -MVI with minerals daily -Boost Breeze po BID, each supplement provides 250 kcal and 9 grams of protein -Magic cup TID with meals, each supplement provides 290 kcal and 9 grams of protein  NUTRITION DIAGNOSIS:   Predicted suboptimal nutrient intake related to lethargy/confusion as evidenced by per patient/family report.  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 She has Rheumatoid arthritis, type 2 DM, hypertension, obesity and hypothyroidism, She was gradually tapered off of steroids over 3 months when she was placed on Humira injections q 2 weeks. Post Haldol and lorazepam she is sedated and sleepy but periodically extends arms to catch something so she can reposition her body. Prior to discharge home today after 9 days in hospital and work up by neurology and infectious disease she had ambulated well, fed herself but had some confusion at night time during hospital stay. She was scheduled to go to inpatient rehab for weakness and had changed her mind over weekend to go home.  Pt admitted with AMS.   Reviewed I/O's: +1.7 L x 24 hours  UOP: 300 ml x 24 hours  Per chart review, pt is oriented only to self. Pt is agitated and highly distractible per RN notes.   Per RD notes from prior admission earlier this month, pt consumes a low sugar, low fat diet PTA. Intake has been poor over the past month per family report on prior admission. No meal completion data currently available,  however, suspect poor oral intake secondary to AMS. Pt does not like Ensure Enlive supplements.   Per review of wt hx, UBW is around 193-198#. Noted pt has experienced a 8.5% wt loss over the past weeks, however, RD questions accuracy of most recent wt.   IVFS: 0.9% NaCl with KCl 20 mEq/L @ 50 ml/hr  Lab Results  Component Value Date   HGBA1C 7.2 (H) 02/16/2019   PTA DM medications are 18 units novolog 70/30 BID and 1000 mg metformin BID.   Labs reviewed: CBGS: 157- 176 (inpatient orders for glycemic control are 0-20 units insulin aspart TID with meals and 1000 mg metformin BID ).   NUTRITION - FOCUSED PHYSICAL EXAM:    Most Recent Value  Orbital Region  Unable to assess  Upper Arm Region  Unable to assess  Thoracic and Lumbar Region  Unable to assess  Buccal Region  Unable to assess  Temple Region  Unable to assess  Clavicle Bone Region  Unable to assess  Clavicle and Acromion Bone Region  Unable to assess  Scapular Bone Region  Unable to assess  Dorsal Hand  Unable to assess  Patellar Region  Unable to assess  Anterior Thigh Region  Unable to assess  Posterior Calf Region  Unable to assess  Edema (RD Assessment)  Unable to assess  Hair  Unable to assess  Eyes  Unable to assess  Mouth  Unable to assess  Skin  Unable to assess  Nails  Unable to assess       Diet Order:  Diet Order            Diet vegetarian Room service appropriate? Yes; Fluid consistency: Thin  Diet effective now              EDUCATION NEEDS:   No education needs have been identified at this time  Skin:  Skin Integrity Issues:: Incisions, Other (Comment) Incisions: closed rt groin Other: open lt arm wound  Last BM:  02/20/19  Height:   Ht Readings from Last 1 Encounters:  02/22/19 5\' 3"  (1.6 m)    Weight:   Wt Readings from Last 1 Encounters:  02/22/19 82.4 kg    Ideal Body Weight:  52.3 kg  BMI:  Body mass index is 32.18 kg/m.  Estimated Nutritional Needs:   Kcal:   1600-1800  Protein:  80-95 grams  Fluid:  1.6-1.8 L    Leahna Hewson A. Jimmye Norman, RD, LDN, [place] Registered Dietitian II Certified Diabetes Care and Education Specialist Pager: [phone] After hours Pager: [phone]

## 2019-02-22 NOTE — Consult Note (Signed)
Telepsych Consultation   Reason for Consult:  Agitation, hallucination, confusion  Referring Physician:  Dr. [name] Location of Patient: [facility] Location of Provider: [facility]  Patient Identification: Victoria Cox MRN:  [id number] Principal Diagnosis: Altered mental status Diagnosis:  Active Problems:   Altered mental status   Total Time spent with patient: 1 hour  Subjective:   Victoria Cox is a 58 y.o. female patient admitted with altered mental status.  HPI:   Per chart review, patient was admitted with altered mental status following a recent admission for possible viral encephalitis. She initially presented to the hospital with myalgias, headache, mouth sores and generalized weakness. Her workup was unremarkable except MRI of brain showed recent or remote lacunar infarct of right corona radiate. Her daughter-in-law reports that she was slightly altered yesterday evening after she was discharged. She became acutely combative so she was brought back to the hospital. She was reportedly confused at night during her prior hospital admission. She has a history of rheumatoid arthritis. She was gradually tapered off steroids over 3 months and started on Humira injections every 2 weeks. She is [place] speaking only. Her family has been present at bedside to assist with interview because the interpreter services used have been unsuccessful with translating her language.   Victoria Cox is unable to participate in interview. She does wave at this notewriter but otherwise does not engage in interview. Her son is at bedside and provides collateral. He reports that his mother was fully functioning prior to last hospitalization. She was independent of her ADLs. She took care of her 59 y/o and 73 y/o grandchildren while her son and daughter-in-law worked. She has been disoriented to person and place. She has not recognized her family at times. He has not noticed her responding to internal  stimuli. She has no prior psychiatric history. Her husband passed away a year ago and she feels lonely at times.   Past Psychiatric History:  Denies   Risk to Self:  None per son  Risk to Others:  None per son  Prior Inpatient Therapy:  None per son  Prior Outpatient Therapy:  None per son   Past Medical History:  Past Medical History:  Diagnosis Date  . Arthritis   . Diabetes (HCC)   . High blood pressure     Past Surgical History:  Procedure Laterality Date  . IR ANGIO EXTERNAL CAROTID SEL EXT CAROTID UNI R MOD SED  02/16/2019  . IR ANGIO INTRA EXTRACRAN SEL COM CAROTID INNOMINATE BILAT MOD SED  02/16/2019  . IR ANGIO VERTEBRAL SEL VERTEBRAL BILAT MOD SED  02/16/2019  . RADIOLOGY WITH ANESTHESIA N/A 02/15/2019   Procedure: IR WITH ANESTHESIA;  Surgeon: Julieanne Cotton, MD;  Location: [facility] OR;  Service: Radiology;  Laterality: N/A;   Family History:  Family History  Problem Relation Age of Onset  . Hypertension Mother    Family Psychiatric  History: Denies per son Social History:  Social History   Substance and Sexual Activity  Alcohol Use No  . Alcohol/week: 0.0 standard drinks     Social History   Substance and Sexual Activity  Drug Use No    Social History   Socioeconomic History  . Marital status: Married    Spouse name: Not on file  . Number of children: Not on file  . Years of education: Not on file  . Highest education level: Not on file  Occupational History  . Not on file  Social Needs  .  Financial resource strain: Not on file  . Food insecurity    Worry: Not on file    Inability: Not on file  . Transportation needs    Medical: Not on file    Non-medical: Not on file  Tobacco Use  . Smoking status: Never Smoker  . Smokeless tobacco: Never Used  Substance and Sexual Activity  . Alcohol use: No    Alcohol/week: 0.0 standard drinks  . Drug use: No  . Sexual activity: Yes    Birth control/protection: Post-menopausal  Lifestyle  . Physical  activity    Days per week: Not on file    Minutes per session: Not on file  . Stress: Not on file  Relationships  . Social Musician on phone: Not on file    Gets together: Not on file    Attends religious service: Not on file    Active member of club or organization: Not on file    Attends meetings of clubs or organizations: Not on file    Relationship status: Not on file  Other Topics Concern  . Not on file  Social History Narrative   Diet, Vegetarian diet   Caffeine, coffee   Married, yes in [address], yes, 2 stories, 4 persons   Pets, no    Current/Past profession, store employer   Exercise, yes walking   Living Will, No    DNR, No    POA/HPOA, No       Additional Social History: She is a widow. She has two adult children. She does not use alcohol or illicit substances.     Allergies:  No Known Allergies  Labs:  Results for orders placed or performed during the hospital encounter of 02/21/19 (from the past 48 hour(s))  TSH     Status: None   Collection Time: 02/21/19 11:27 PM  Result Value Ref Range   TSH 1.696 0.350 - 4.500 uIU/mL    Comment: Performed by a 3rd Generation assay with a functional sensitivity of <=0.01 uIU/mL. Performed at [facility], [address] [place], [place] [address]   Comprehensive metabolic panel     Status: Abnormal   Collection Time: 02/21/19 11:45 PM  Result Value Ref Range   Sodium 133 (L) 135 - 145 mmol/L   Potassium 3.8 3.5 - 5.1 mmol/L   Chloride 97 (L) 98 - 111 mmol/L   CO2 20 (L) 22 - 32 mmol/L   Glucose, Bld 184 (H) 70 - 99 mg/dL   BUN 7 6 - 20 mg/dL   Creatinine, Ser 1.91 0.44 - 1.00 mg/dL   Calcium 9.4 8.9 - 47.8 mg/dL   Total Protein 8.8 (H) 6.5 - 8.1 g/dL   Albumin 3.7 3.5 - 5.0 g/dL   AST 65 (H) 15 - 41 U/L   ALT 64 (H) 0 - 44 U/L   Alkaline Phosphatase 103 38 - 126 U/L   Total Bilirubin 0.7 0.3 - 1.2 mg/dL   GFR calc non Af Amer >60 >60 mL/min   GFR calc Af Amer >60 >60 mL/min   Anion gap  16 (H) 5 - 15    Comment: Performed at [facility], [address] [place], [place] [address]  CBC with Differential     Status: Abnormal   Collection Time: 02/21/19 11:45 PM  Result Value Ref Range   WBC 10.9 (H) 4.0 - 10.5 K/uL   RBC 4.74 3.87 - 5.11 MIL/uL   Hemoglobin  12.9 12.0 - 15.0 g/dL   HCT 16.140.0 09.636.0 - 04.546.0 %   MCV 84.4 80.0 - 100.0 fL   MCH 27.2 26.0 - 34.0 pg   MCHC 32.3 30.0 - 36.0 g/dL   RDW 40.915.3 81.111.5 - 91.415.5 %   Platelets 473 (H) 150 - 400 K/uL   nRBC 0.0 0.0 - 0.2 %   Neutrophils Relative % 78 %   Neutro Abs 8.6 (H) 1.7 - 7.7 K/uL   Lymphocytes Relative 15 %   Lymphs Abs 1.6 0.7 - 4.0 K/uL   Monocytes Relative 6 %   Monocytes Absolute 0.6 0.1 - 1.0 K/uL   Eosinophils Relative 0 %   Eosinophils Absolute 0.0 0.0 - 0.5 K/uL   Basophils Relative 0 %   Basophils Absolute 0.0 0.0 - 0.1 K/uL   Immature Granulocytes 1 %   Abs Immature Granulocytes 0.05 0.00 - 0.07 K/uL    Comment: Performed at [facility], [address] [place], [place] [address]  Protime-INR     Status: None   Collection Time: 02/21/19 11:45 PM  Result Value Ref Range   Prothrombin Time 13.4 11.4 - 15.2 seconds   INR 1.0 0.8 - 1.2    Comment: (NOTE) INR goal varies based on device and disease states. Performed at [facility], [address] [place], [place] [address]   Lactic acid, plasma     Status: None   Collection Time: 02/21/19 11:46 PM  Result Value Ref Range   Lactic Acid, Venous 1.9 0.5 - 1.9 mmol/L    Comment: Performed at [facility], [address] [place], [place] [address]  Ammonia     Status: None   Collection Time: 02/21/19 11:46 PM  Result Value Ref Range   Ammonia 15 9 - 35 umol/L    Comment: Performed at [facility], [address] [place], [place] [address]  SARS Coronavirus 2 (CEPHEID - Performed in [facility] hospital lab), Hosp Order     Status: None   Collection Time: 02/21/19 11:50 PM   Specimen: Nasopharyngeal Swab  Result  Value Ref Range   SARS Coronavirus 2 NEGATIVE NEGATIVE    Comment: (NOTE) If result is NEGATIVE SARS-CoV-2 target nucleic acids are NOT DETECTED. The SARS-CoV-2 RNA is generally detectable in upper and lower  respiratory specimens during the acute phase of infection. The lowest  concentration of SARS-CoV-2 viral copies this assay can detect is 250  copies / mL. A negative result does not preclude SARS-CoV-2 infection  and should not be used as the sole basis for treatment or other  patient management decisions.  A negative result may occur with  improper specimen collection / handling, submission of specimen other  than nasopharyngeal swab, presence of viral mutation(s) within the  areas targeted by this assay, and inadequate number of viral copies  (<250 copies / mL). A negative result must be combined with clinical  observations, patient history, and epidemiological information. If result is POSITIVE SARS-CoV-2 target nucleic acids are DETECTED. The SARS-CoV-2 RNA is generally detectable in upper and lower  respiratory specimens dur ing the acute phase of infection.  Positive  results are indicative of active infection with SARS-CoV-2.  Clinical  correlation with patient history and other diagnostic information is  necessary to determine patient infection status.  Positive results do  not rule out bacterial infection or co-infection with other viruses. If result is PRESUMPTIVE POSTIVE SARS-CoV-2 nucleic acids MAY BE PRESENT.  A presumptive positive result was obtained on the submitted specimen  and confirmed on repeat testing.  While 2019 novel coronavirus  (SARS-CoV-2) nucleic acids may be present in the submitted sample  additional confirmatory testing may be necessary for epidemiological  and / or clinical management purposes  to differentiate between  SARS-CoV-2 and other Sarbecovirus currently known to infect humans.  If clinically indicated additional testing with an  alternate test  methodology [id number]) is advised. The SARS-CoV-2 RNA is generally  detectable in upper and lower respiratory sp ecimens during the acute  phase of infection. The expected result is Negative. Fact Sheet for Patients:  BoilerBrush.com.cy Fact Sheet for Healthcare Providers: https://pope.com/ This test is not yet approved or cleared by the [place] FDA and has been authorized for detection and/or diagnosis of SARS-CoV-2 by FDA under an Emergency Use Authorization (EUA).  This EUA will remain in effect (meaning this test can be used) for the duration of the COVID-19 declaration under Section 564(b)(1) of the Act, 21 U.S.C. section 360bbb-3(b)(1), unless the authorization is terminated or revoked sooner. Performed at [facility], [address] [place], [place] [address]   I-Stat beta hCG blood, ED     Status: None   Collection Time: 02/21/19 11:52 PM  Result Value Ref Range   I-stat hCG, quantitative <5.0 <5 mIU/mL   Comment 3            Comment:   GEST. AGE      CONC.  (mIU/mL)   <=1 WEEK        5 - 50     2 WEEKS       50 - 500     3 WEEKS       100 - 10,000     4 WEEKS     1,000 - 30,000        FEMALE AND NON-PREGNANT FEMALE:     LESS THAN 5 mIU/mL   POCT I-Stat EG7     Status: Abnormal   Collection Time: 02/21/19 11:54 PM  Result Value Ref Range   pH, Ven 7.553 (H) 7.250 - 7.430   pCO2, Ven 25.5 (L) 44.0 - 60.0 mmHg   pO2, Ven 53.0 (H) 32.0 - 45.0 mmHg   Bicarbonate 22.4 20.0 - 28.0 mmol/L   TCO2 23 22 - 32 mmol/L   O2 Saturation 92.0 %   Acid-Base Excess 2.0 0.0 - 2.0 mmol/L   Sodium 134 (L) 135 - 145 mmol/L   Potassium 3.8 3.5 - 5.1 mmol/L   Calcium, Ion 1.08 (L) 1.15 - 1.40 mmol/L   HCT 42.0 36.0 - 46.0 %   Hemoglobin 14.3 12.0 - 15.0 g/dL   Patient temperature HIDE    Sample type VENOUS   Urinalysis, Routine w reflex microscopic     Status: Abnormal   Collection Time: 02/22/19  4:32 AM   Result Value Ref Range   Color, Urine STRAW (A) YELLOW   APPearance CLEAR CLEAR   Specific Gravity, Urine 1.006 1.005 - 1.030   pH 8.0 5.0 - 8.0   Glucose, UA 50 (A) NEGATIVE mg/dL   Hgb urine dipstick SMALL (A) NEGATIVE   Bilirubin Urine NEGATIVE NEGATIVE   Ketones, ur 5 (A) NEGATIVE mg/dL   Protein, ur NEGATIVE NEGATIVE mg/dL   Nitrite NEGATIVE NEGATIVE   Leukocytes,Ua NEGATIVE NEGATIVE   RBC / HPF 0-5 0 - 5 RBC/hpf   Bacteria, UA NONE SEEN NONE SEEN   Squamous Epithelial / LPF 0-5 0 -  5   Mucus PRESENT     Comment: Performed at [facility], [address] [place], [place] [address]  Rapid urine drug screen (hospital performed)     Status: Abnormal   Collection Time: 02/22/19  4:32 AM  Result Value Ref Range   Opiates NONE DETECTED NONE DETECTED   Cocaine NONE DETECTED NONE DETECTED   Benzodiazepines POSITIVE (A) NONE DETECTED   Amphetamines NONE DETECTED NONE DETECTED   Tetrahydrocannabinol NONE DETECTED NONE DETECTED   Barbiturates NONE DETECTED NONE DETECTED    Comment: (NOTE) DRUG SCREEN FOR MEDICAL PURPOSES ONLY.  IF CONFIRMATION IS NEEDED FOR ANY PURPOSE, NOTIFY LAB WITHIN 5 DAYS. LOWEST DETECTABLE LIMITS FOR URINE DRUG SCREEN Drug Class                     Cutoff (ng/mL) Amphetamine and metabolites    1000 Barbiturate and metabolites    200 Benzodiazepine                 200 Tricyclics and metabolites     300 Opiates and metabolites        300 Cocaine and metabolites        300 THC                            50 Performed at [facility], [address], [place], [place] [address]   Glucose, capillary     Status: Abnormal   Collection Time: 02/22/19  8:03 AM  Result Value Ref Range   Glucose-Capillary 176 (H) 70 - 99 mg/dL  Lactic acid, plasma     Status: None   Collection Time: 02/22/19  8:25 AM  Result Value Ref Range   Lactic Acid, Venous 1.6 0.5 - 1.9 mmol/L    Comment: Performed at [facility], [address], [place],  [place] [address]  Ethanol     Status: None   Collection Time: 02/22/19  8:25 AM  Result Value Ref Range   Alcohol, Ethyl (B) <10 <10 mg/dL    Comment: (NOTE) Lowest detectable limit for serum alcohol is 10 mg/dL. For medical purposes only. Performed at [facility], [address], [place], [place] [address]   Salicylate level     Status: None   Collection Time: 02/22/19  8:25 AM  Result Value Ref Range   Salicylate Lvl <7.0 2.8 - 30.0 mg/dL    Comment: Performed at [facility], [address] [place], [place] [address]  Acetaminophen level     Status: Abnormal   Collection Time: 02/22/19  8:25 AM  Result Value Ref Range   Acetaminophen (Tylenol), Serum <10 (L) 10 - 30 ug/mL    Comment: (NOTE) Therapeutic concentrations vary significantly. A range of 10-30 ug/mL  may be an effective concentration for many patients. However, some  are best treated at concentrations outside of this range. Acetaminophen concentrations >150 ug/mL at 4 hours after ingestion  and >50 ug/mL at 12 hours after ingestion are often associated with  toxic reactions. Performed at [facility], [address] [place], [place] [address]   Creatinine, serum     Status: None   Collection Time: 02/22/19  8:25 AM  Result Value Ref Range   Creatinine, Ser 0.60 0.44 - 1.00 mg/dL   GFR calc non Af Amer >60 >60 mL/min   GFR calc Af Amer >60 >60 mL/min  Comment: Performed at [facility], [address], [place], [place] [address]  CBC     Status: Abnormal   Collection Time: 02/22/19  8:25 AM  Result Value Ref Range   WBC 9.6 4.0 - 10.5 K/uL   RBC 4.42 3.87 - 5.11 MIL/uL   Hemoglobin 12.0 12.0 - 15.0 g/dL   HCT 76.5 46.5 - 03.5 %   MCV 85.5 80.0 - 100.0 fL   MCH 27.1 26.0 - 34.0 pg   MCHC 31.7 30.0 - 36.0 g/dL   RDW 46.5 (H) 68.1 - 27.5 %   Platelets 400 150 - 400 K/uL   nRBC 0.0 0.0 - 0.2 %    Comment: Performed at [facility], [address], [place], [place] [address]   Sedimentation rate     Status: Abnormal   Collection Time: 02/22/19  8:25 AM  Result Value Ref Range   Sed Rate 42 (H) 0 - 22 mm/hr    Comment: Performed at [facility], [address] [place], [place] [address]  Vitamin B12     Status: Abnormal   Collection Time: 02/22/19  8:25 AM  Result Value Ref Range   Vitamin B-12 924 (H) 180 - 914 pg/mL    Comment: (NOTE) This assay is not validated for testing neonatal or myeloproliferative syndrome specimens for Vitamin B12 levels. Performed at [facility], [address] [place], [place] [address]   C-reactive protein     Status: Abnormal   Collection Time: 02/22/19  8:25 AM  Result Value Ref Range   CRP 2.3 (H) <1.0 mg/dL    Comment: Performed at [facility], [address], [place], [place] [address]    Medications:  Current Facility-Administered Medications  Medication Dose Route Frequency Provider Last Rate Last Dose  . 0.9 % NaCl with KCl 20 mEq/ L  infusion   Intravenous Continuous [name], MD 50 mL/hr at 02/22/19 0532    . acetaminophen (TYLENOL) tablet 500 mg  500 mg Oral PRN [name], MD      . amLODipine (NORVASC) tablet 5 mg  5 mg Oral QPM [name], MD      . aspirin tablet 325 mg  325 mg Oral Daily [name], MD   325 mg at 02/22/19 0950  . clopidogrel (PLAVIX) tablet 75 mg  75 mg Oral Daily [name], MD   75 mg at 02/22/19 0950  . docusate sodium (COLACE) capsule 100 mg  100 mg Oral BID [name], MD   100 mg at 02/22/19 0950  . famotidine (PEPCID) tablet 20 mg  20 mg Oral BID [name], MD   20 mg at 02/22/19 0950  . folic acid (FOLVITE) tablet 2 mg  2 mg Oral Daily [name], MD   2 mg at 02/22/19 0949  . haloperidol lactate (HALDOL) injection 1 mg  1 mg Intravenous Q6H PRN [name], MD      . heparin injection 5,000 Units  5,000 Units Subcutaneous Q8H [name], MD   5,000 Units at 02/22/19 0548  . insulin aspart (novoLOG) injection 0-20 Units  0-20  Units Subcutaneous TID WC [name], MD   4 Units at 02/22/19 6659  . irbesartan (AVAPRO) tablet 150 mg  150 mg Oral Daily [name], MD   150 mg at 02/22/19 0949  . [START ON 02/23/2019] levothyroxine (SYNTHROID) tablet 75 mcg  75 mcg Oral Daily [name], MD      .  LORazepam (ATIVAN) injection 1 mg  1 mg Intravenous Q6H PRN Orpah CobbKadakia, Ajay, MD   1 mg at 02/22/19 0251  . meclizine (ANTIVERT) tablet 25 mg  25 mg Oral TID PRN Orpah CobbKadakia, Ajay, MD      . metFORMIN (GLUCOPHAGE) tablet 1,000 mg  1,000 mg Oral BID WC Orpah CobbKadakia, Ajay, MD      . metoprolol succinate (TOPROL-XL) 24 hr tablet 50 mg  50 mg Oral Daily Orpah CobbKadakia, Ajay, MD   50 mg at 02/22/19 0949  . multivitamin with minerals tablet 1 tablet  1 tablet Oral Daily Orpah CobbKadakia, Ajay, MD   1 tablet at 02/22/19 0950  . oxyCODONE (Oxy IR/ROXICODONE) immediate release tablet 5 mg  5 mg Oral Once Orpah CobbKadakia, Ajay, MD      . pantoprazole (PROTONIX) EC tablet 40 mg  40 mg Oral Daily Orpah CobbKadakia, Ajay, MD   40 mg at 02/22/19 0950  . polyethylene glycol (MIRALAX / GLYCOLAX) packet 17 g  17 g Oral Daily Orpah CobbKadakia, Ajay, MD   17 g at 02/22/19 0950  . potassium chloride (K-DUR) CR tablet 10 mEq  10 mEq Oral Daily Orpah CobbKadakia, Ajay, MD   10 mEq at 02/22/19 [id number]    Musculoskeletal: Strength & Muscle Tone: No atrophy noted. Gait & Station: UTA since patient is lying in bed. Patient leans: N/A  Psychiatric Specialty Exam: Physical Exam  Nursing note and vitals reviewed. Constitutional: She appears well-developed and well-nourished.  HENT:  Head: Normocephalic and atraumatic.  Neck: Normal range of motion.  Respiratory: Effort normal.  Musculoskeletal: Normal range of motion.  Neurological: She is alert.  Psychiatric: Cognition and memory are impaired.  Patient will not engage in interview due to AMS.     Review of Systems  Unable to perform ROS: Mental status change    Blood pressure (!) 177/78, pulse 96, temperature 98.5 F (36.9 C), temperature source Oral,  resp. rate 20, height 5\' 3"  (1.6 m), weight 82.4 kg, SpO2 98 %.Body mass index is 32.18 kg/m.  General Appearance: Fairly Groomed, middle aged, Asian female, wearing a hospital gown who is lying in bed. NAD.   Eye Contact:  Good  Speech:  Patient will not participate in interview.   Volume:  Patient will not participate in interview.   Mood:  Patient will not participate in interview.   Affect:  Constricted  Thought Process:  NA  Orientation:  NA  Thought Content:  NA  Suicidal Thoughts:  Patient will not participate in interview.   Homicidal Thoughts:  Patient will not participate in interview.   Memory:  NA  Judgement:  NA  Insight:  NA  Psychomotor Activity:  Decreased  Concentration:  Concentration: NA and Attention Span: NA  Recall:  NA  Fund of Knowledge:  NA  Language:  NA  Akathisia:  NA  Handed:  Right  AIMS (if indicated):   N/A  Assets:  Housing Resilience Social Support  ADL's:  Impaired  Cognition:  WNL  Sleep:   N/A   Assessment:  Victoria Cox is a 58 y.o. female who was admitted with altered mental status following a recent admission for possible viral encephalitis. She is unable to participate in interview and history is provided by her son at bedside. He reports an acute decline in her mental status since her recent admission for possible viral encephalitis. She has been disoriented. She does not appear to be responding to internal stimuli. She does not have a prior psychiatric history. Her presentation is likely secondary to delirium  in the setting of current medical condition. Recommend Seroquel as needed for agitation.   Treatment Plan Summary: -Recommend Seroquel 25 mg BID PRN for agitation secondary to AMS. Can increase to 50 mg BID PRN for symptom management.  -EKG reviewed and QTc 495. Please closely monitor when starting or increasing QTc prolonging agents.  -Psychiatry will sign off on patient at this time. Please consult psychiatry again as  needed.   Disposition: Patient does not meet criteria for psychiatric inpatient admission.  This service was provided via telemedicine using a 2-way, interactive audio and video technology.  Names of all persons participating in this telemedicine service and their role in this encounter. Name: Buford Dresser, DO Role: Psychiatrist   Name: Victoria Cox Role: Patient   Name: Lauro Regulus Role: Patient's son    Faythe Dingwall, DO 02/22/2019 12:19 PM

## 2019-02-23 ENCOUNTER — Encounter (HOSPITAL_COMMUNITY): Payer: Self-pay | Admitting: Interventional Radiology

## 2019-02-23 DIAGNOSIS — G9340 Encephalopathy, unspecified: Secondary | ICD-10-CM

## 2019-02-23 DIAGNOSIS — R4182 Altered mental status, unspecified: Secondary | ICD-10-CM

## 2019-02-23 LAB — GLUCOSE, CAPILLARY
Glucose-Capillary: 145 mg/dL — ABNORMAL HIGH (ref 70–99)
Glucose-Capillary: 142 mg/dL — ABNORMAL HIGH (ref 70–99)
Glucose-Capillary: 253 mg/dL — ABNORMAL HIGH (ref 70–99)
Glucose-Capillary: 276 mg/dL — ABNORMAL HIGH (ref 70–99)

## 2019-02-23 LAB — FOLATE RBC
Folate, Hemolysate: 482 ng/mL
Folate, RBC: 1331 ng/mL (ref >498)
Hematocrit: 36.2 % (ref 34.0–46.6)

## 2019-02-23 LAB — C3 COMPLEMENT: C3 Complement: 161 mg/dL (ref 82–167)

## 2019-02-23 LAB — ANTI-DNA ANTIBODY, DOUBLE-STRANDED: ds DNA Ab: 1 IU/mL (ref 0–9)

## 2019-02-23 LAB — ANTINUCLEAR ANTIBODIES, IFA: ANA Ab, IFA: POSITIVE — AB

## 2019-02-23 LAB — ANTI-SMITH ANTIBODY: ENA SM Ab Ser-aCnc: <0.2 AI (ref 0.0–0.9)

## 2019-02-23 LAB — FANA STAINING PATTERNS: Homogeneous Pattern: 1:160 {titer} — ABNORMAL HIGH

## 2019-02-23 LAB — C4 COMPLEMENT: Complement C4, Body Fluid: 44 mg/dL (ref 14–44)

## 2019-02-23 MED ORDER — SODIUM CHLORIDE 0.9 % IV SOLN
1000.0000 mg | INTRAVENOUS | Status: AC
  Administered 2019-02-23 – 2019-02-27 (×5): 1000 mg via INTRAVENOUS
  Filled 2019-02-23 (×5): qty 8

## 2019-02-23 MED ORDER — INSULIN GLARGINE 100 UNIT/ML ~~LOC~~ SOLN
10.0000 [IU] | Freq: Every day | SUBCUTANEOUS | Status: DC
  Administered 2019-02-23: 10 [IU] via SUBCUTANEOUS
  Filled 2019-02-23 (×2): qty 0.1

## 2019-02-23 NOTE — Progress Notes (Signed)
Ref: Victoria Dials, MD   Subjective:  Feeling little better with antidepressant. Afebrile. Now on IV solumedrol high dose for possible autoimmune encephalitis post discussion with family and neurology. VS stable. Flat affect per patient.  ANA- positive more than 1:160, ? Drug induced SLE. Anti-DNA antibody, double stranded is negative at 1 IU/mL. Anti-Smith antibody less than 0.2 AI C-reactive protein is 2.3 mg/dL. Folate, RBC 1,331 ng/mL. B-12: 924 pg/mL. Sed Rate: 42 Complement C3 normal, C4 high normal.  Objective:  Vital Signs in the last 24 hours: Temp:  [98.2 F (36.8 C)-98.7 F (37.1 C)] 98.2 F (36.8 C) (07/29 1408) Pulse Rate:  [92-108] 97 (07/29 1408) Cardiac Rhythm: Normal sinus rhythm (07/29 0925) Resp:  [16] 16 (07/29 1408) BP: (137-162)/(73-98) 137/75 (07/29 1705) SpO2:  [96 %-100 %] 98 % (07/29 1408)  Physical Exam: BP Readings from Last 1 Encounters:  02/23/19 137/75     Wt Readings from Last 1 Encounters:  02/22/19 82.4 kg    Weight change:  Body mass index is 32.18 kg/m. HEENT: [place]/AT, Eyes-Brown, PERL, EOMI, Conjunctiva-Pink, Sclera-Non-icteric Neck: No JVD, No bruit, Trachea midline. Back of neck mildly tender. Lungs:  Clear, Bilateral. Cardiac:  Regular rhythm, normal S1 and S2, no S3. II/VI systolic murmur. Abdomen:  Soft, non-tender. BS present. Extremities:  No edema present. No cyanosis. No clubbing. CNS: AxOx2, Cranial nerves grossly intact, moves all 4 extremities slowly.  Skin: Warm and dry.   Intake/Output from previous day: 07/28 0701 - 07/29 0700 In: 376.4 [I.V.:376.4] Out: 1000 [Urine:1000]    Lab Results: BMET    Component Value Date/Time   NA 134 (L) 02/21/2019 2354   NA 133 (L) 02/21/2019 2345   NA 135 02/21/2019 0548   NA 139 11/30/2014 1001   K 3.8 02/21/2019 2354   K 3.8 02/21/2019 2345   K 3.6 02/21/2019 0548   CL 97 (L) 02/21/2019 2345   CL 102 02/21/2019 0548   CL 95 (L) 02/20/2019 1120   CO2 20 (L) 02/21/2019  2345   CO2 24 02/21/2019 0548   CO2 25 02/20/2019 1120   GLUCOSE 184 (H) 02/21/2019 2345   GLUCOSE 152 (H) 02/21/2019 0548   GLUCOSE 162 (H) 02/20/2019 1120   BUN 7 02/21/2019 2345   BUN 6 02/21/2019 0548   BUN 5 (L) 02/20/2019 1120   BUN 11 11/30/2014 1001   CREATININE 0.60 02/22/2019 0825   CREATININE 0.65 02/21/2019 2345   CREATININE 0.55 02/21/2019 0548   CALCIUM 9.4 02/21/2019 2345   CALCIUM 8.9 02/21/2019 0548   CALCIUM 8.9 02/20/2019 1120   GFRNONAA >60 02/22/2019 0825   GFRNONAA >60 02/21/2019 2345   GFRNONAA >60 02/21/2019 0548   GFRAA >60 02/22/2019 0825   GFRAA >60 02/21/2019 2345   GFRAA >60 02/21/2019 0548   CBC    Component Value Date/Time   WBC 9.6 02/22/2019 0825   RBC 4.42 02/22/2019 0825   HGB 12.0 02/22/2019 0825   HGB 12.5 11/30/2014 1001   HCT 37.8 02/22/2019 0825   HCT 36.2 02/22/2019 0825   PLT 400 02/22/2019 0825   PLT 369 11/30/2014 1001   MCV 85.5 02/22/2019 0825   MCV 83 11/30/2014 1001   MCH 27.1 02/22/2019 0825   MCHC 31.7 02/22/2019 0825   RDW 15.6 (H) 02/22/2019 0825   RDW 14.1 11/30/2014 1001   LYMPHSABS 1.6 02/21/2019 2345   LYMPHSABS 2.2 11/30/2014 1001   MONOABS 0.6 02/21/2019 2345   EOSABS 0.0 02/21/2019 2345   EOSABS 0.2 11/30/2014  1001   BASOSABS 0.0 02/21/2019 2345   BASOSABS 0.0 11/30/2014 1001   HEPATIC Function Panel Recent Labs    02/12/19 1857 02/18/19 0459 02/21/19 2345  PROT 8.1 6.7 8.8*   HEMOGLOBIN A1C No components found for: HGA1C,  MPG CARDIAC ENZYMES Lab Results  Component Value Date   CKTOTAL 18 (L) 02/14/2019   CKMB 0.9 02/14/2019   BNP No results for input(s): PROBNP in the last 8760 hours. TSH Recent Labs    02/13/19 0629 02/21/19 2327  TSH 0.367 1.696   CHOLESTEROL Recent Labs    02/16/19 0639  CHOL 167    Scheduled Meds: . amLODipine  5 mg Oral QPM  . aspirin  325 mg Oral Daily  . clopidogrel  75 mg Oral Daily  . docusate sodium  100 mg Oral BID  . famotidine  20 mg Oral BID   . feeding supplement  1 Container Oral BID BM  . folic acid  2 mg Oral Daily  . heparin  5,000 Units Subcutaneous Q8H  . insulin aspart  0-20 Units Subcutaneous TID WC  . irbesartan  150 mg Oral Daily  . levothyroxine  75 mcg Oral Daily  . metFORMIN  1,000 mg Oral BID WC  . metoprolol succinate  50 mg Oral Daily  . multivitamin with minerals  1 tablet Oral Daily  . oxyCODONE  5 mg Oral Once  . pantoprazole  40 mg Oral Daily  . polyethylene glycol  17 g Oral Daily  . potassium chloride  10 mEq Oral Daily  . QUEtiapine  25 mg Oral BID   Continuous Infusions: . 0.9 % NaCl with KCl 20 mEq / L 50 mL/hr at 02/23/19 1605  . methylPREDNISolone (SOLU-MEDROL) injection Stopped (02/23/19 1604)   PRN Meds:.acetaminophen, guaiFENesin-dextromethorphan, haloperidol lactate, LORazepam, meclizine  Assessment/Plan: Altered mental status, imptoving Generalized weakness Rheumatoid arthritis Recent Corona Radiata lacunar infarct Possible autoimmune encephalitis Viral encephalitis, less likely Hypertension Type 2 DM Obesity Hypothyroidism Possible major depression  Appreciate Neurology consult. IV solumedrol. Start SQ Lantus for sugar control. CBC, BMET in AM   LOS: 1 day   Time spent including chart review, lab review, examination, discussion with patient : 40 min   Victoria Cobb  MD  02/23/2019, 5:19 PM

## 2019-02-23 NOTE — Consult Note (Signed)
NEURO HOSPITALIST CONSULT NOTE   Requestig physician: Dr. Algie Coffer  Reason for Consult: Recurrence of AMS  History obtained from:  Son  HPI:                                                                                                                                          Victoria Cox is an 58 y.o. female requiring Gujarati interpreter, who re-presents from home with AMS, one day after being discharged. She had an extensive toxic/metabolic/infectious work up last admission, which did not reveal an etiology for her AMS, including normal ammonia, normal blood cultures x 2, essentially unremarkable Chem7 and normal LFTs.  Neurological work up last admission for possible encephalitis or vasculitis, with LP showing markedly elevated WBC of 425 (predominantly monocytic) with elevated protein of 103; glucose was "normal" at 46, but this was in the context of stable serum glucoses of 131-133 on Chem7 during the 3 day period overlapping the day when the LP was obtained - the CSF glucose was therefore abnormally low, being less than 60% of the serum value. She had been on acyclovir, but this was stopped when HSV PCR came back negative. VZV and CMV PCR tests were also negative. Cryptococcus Ag was negative. RMSF negative. HIV negative. West Nile virus had been sent out, which is still pending as of today (7/29). CSF culture was negative. CSF AFB was negative.  MRI on 7/22 showed a small focus of acute ischemia within the right corona radiata without hemorrhage or mass effect. Mild white matter changes of chronic small vessel ischemia were also noted.  CTA showed suspicion of left M2 superior segment occlusion, but this was not present on subsequent 4-vessel angiogram.   TTE showed normal systolic function and no mural thrombus.     4 vessel angiogram by Dr. Corliss Skains on 7/23 showed patent vasculature with no evidence for vasculitis.   Vitamin B12 level this admission is normal at  924. CRP elevated at 2.3. Lactate normal.   Her PMHx includes rheumatoid arthritis on Humira, DM2, HTN, obesity and hypothyroidism. For her arthritis, she was gradually tapered off of steroids over 3 months when she was placed on Humira injections.   Past Medical History:  Diagnosis Date  . Arthritis   . Diabetes (HCC)   . High blood pressure     Past Surgical History:  Procedure Laterality Date  . IR ANGIO EXTERNAL CAROTID SEL EXT CAROTID UNI R MOD SED  02/16/2019  . IR ANGIO INTRA EXTRACRAN SEL COM CAROTID INNOMINATE BILAT MOD SED  02/16/2019  . IR ANGIO VERTEBRAL SEL VERTEBRAL BILAT MOD SED  02/16/2019  . RADIOLOGY WITH ANESTHESIA N/A 02/15/2019   Procedure: IR WITH ANESTHESIA;  Surgeon: Julieanne Cotton, MD;  Location: [facility] OR;  Service: Radiology;  Laterality:  N/A;    Family History  Problem Relation Age of Onset  . Hypertension Mother               Social History:  reports that she has never smoked. She has never used smokeless tobacco. She reports that she does not drink alcohol or use drugs.  No Known Allergies  MEDICATIONS:                                                                                                                     Scheduled: . amLODipine  5 mg Oral QPM  . aspirin  325 mg Oral Daily  . clopidogrel  75 mg Oral Daily  . docusate sodium  100 mg Oral BID  . famotidine  20 mg Oral BID  . feeding supplement  1 Container Oral BID BM  . folic acid  2 mg Oral Daily  . heparin  5,000 Units Subcutaneous Q8H  . insulin aspart  0-20 Units Subcutaneous TID WC  . irbesartan  150 mg Oral Daily  . levothyroxine  75 mcg Oral Daily  . metFORMIN  1,000 mg Oral BID WC  . metoprolol succinate  50 mg Oral Daily  . multivitamin with minerals  1 tablet Oral Daily  . oxyCODONE  5 mg Oral Once  . pantoprazole  40 mg Oral Daily  . polyethylene glycol  17 g Oral Daily  . potassium chloride  10 mEq Oral Daily  . QUEtiapine  25 mg Oral BID   Continuous: . 0.9 % NaCl  with KCl 20 mEq / L 50 mL/hr at 02/23/19 0355     ROS:                                                                                                                                       Unable to obtain due to AMS.  Blood pressure (!) 141/77, pulse 100, temperature 98.6 F (37 C), resp. rate 16, height 5\' 3"  (1.6 m), weight 82.4 kg, SpO2 99 %.   General Examination:  Physical Exam  HEENT-  [place]/AT   Lungs- Respirations unlabored Extremities- Warm and well perfused Skin- No rash on distal upper extremities, distal lower extremities or face  Neurological Examination Mental Status: Alert, flat affect, knows she is at hospital but not which one, knows year and month, able to follow 2 step commands. Increased latency of verbal responses. Per son, she has been having formed visual hallucinations. Non-agitated. With son providing interpretation during exam, she has difficulty comprehending/following some motor commands Cranial Nerves: ZO:XWRUEAI:Visual fields grossly normal, fixates normally on visual stimuli. PERRL III,IV, VI: ptosis not present, extra-ocular motions intact bilaterally  V,VII: smile symmetric, facial light touch sensation normal bilaterally VIII: hearing intact to conversation IX,X: Palate rises symmetrically XI: Symmetric XII: midline tongue extension Motor: Right : Upper extremity   5/5    Left:     Upper extremity   5/5  Lower extremity   5/5     Lower extremity   5/5 Normal tone throughout; no atrophy noted Sensory: Pinprick and light touch intact throughout, bilaterally Deep Tendon Reflexes: 2+ and symmetric throughout Plantars: Right: downgoing   Left: downgoing Cerebellar: normal finger-to-nose    Lab Results: Basic Metabolic Panel: Recent Labs  Lab 02/18/19 0459 02/19/19 0336 02/20/19 1120 02/21/19 0548 02/21/19 2345 02/21/19 2354 02/22/19 0825  NA 131*   --  130* 135 133* 134*  --   K 3.3*  --  3.0* 3.6 3.8 3.8  --   CL 100  --  95* 102 97*  --   --   CO2 19*  --  25 24 20*  --   --   GLUCOSE 155*  --  162* 152* 184*  --   --   BUN <5*  --  5* 6 7  --   --   CREATININE 0.74 0.60 0.58 0.55 0.65  --  0.60  CALCIUM 8.4*  --  8.9 8.9 9.4  --   --     CBC: Recent Labs  Lab 02/18/19 0459 02/20/19 1120 02/21/19 2345 02/21/19 2354 02/22/19 0825  WBC 7.0 7.6 10.9*  --  9.6  NEUTROABS 3.1 5.4 8.6*  --   --   HGB 11.7* 10.8* 12.9 14.3 12.0  HCT 36.7 33.2* 40.0 42.0 37.8  MCV 85.9 83.6 84.4  --  85.5  PLT 242 355 473*  --  400    Cardiac Enzymes: No results for input(s): CKTOTAL, CKMB, CKMBINDEX, TROPONINI in the last 168 hours.  Lipid Panel: No results for input(s): CHOL, TRIG, HDL, CHOLHDL, VLDL, LDLCALC in the last 168 hours.  Imaging: Ct Head Wo Contrast  Result Date: 02/22/2019 CLINICAL DATA:  Altered LOC EXAM IMPRESSION: 1. Small hypodensity within the right white matter, corresponding to previous MRI demonstrated lacunar infarct. No other acute intracranial abnormality is seen. Electronically Signed   By: Jasmine PangKim  Fujinaga M.D.   On: 02/22/2019 01:18   Dg Chest Port 1 View  Result Date: 02/22/2019 CLINICAL DATA:  Altered mental status EXAM: PORTABLE CHEST 1 VIEW COMPARISON:  02/18/2019, 02/16/2019 FINDINGS: Low lung volumes. No focal airspace disease or effusion. Normal heart size. No pneumothorax. IMPRESSION: No active disease. Electronically Signed   By: Jasmine PangKim  Fujinaga M.D.   On: 02/22/2019 01:18    Assessment: 58 year old female re-presenting with persistent confusion 1. There is a waxing-waning component per her son. She at times is disoriented, at other times relatively well functioning, but history is not consistent with her ever fully returning to cognitive baseline after initial  presentation to the hospital last admission.  2. Although her MRI last admission was read as normal, on my review of the images there appears to be  subtle increased DWI and FLAIR signal throughout her cerebral cortices as well as subtle increased basal ganglia signal. These findings are suggestive of a diffuse CNS inflammatory process affecting the grey matter.  3. Her persistent AMS, MRI findings suggestive of diffuse cortical edema, markedly elevated WBC in conjunction with elevated protein in the absence of fever or meningismus, as well as monocytic predominance of the CSF WBCs is most consistent with an inflammatory process. 3. Also in support of an autoimmune encephalopathy, she has a pre-existing autoimmune condition (RA) which predisposes to additional autoimmune conditions.   Recommendations: 1. Starting IV Solumedrol 1000 mg qd x 5 days. Discussed with her son, who consented to proceed with treatment.  2. Will monitor for possible improvement.    Electronically signed: Dr. Caryl PinaEric Trisa Cranor 02/23/2019, 8:47 AM

## 2019-02-24 LAB — GLUCOSE, CAPILLARY
Glucose-Capillary: 218 mg/dL — ABNORMAL HIGH (ref 70–99)
Glucose-Capillary: 183 mg/dL — ABNORMAL HIGH (ref 70–99)
Glucose-Capillary: 190 mg/dL — ABNORMAL HIGH (ref 70–99)
Glucose-Capillary: 191 mg/dL — ABNORMAL HIGH (ref 70–99)

## 2019-02-24 MED ORDER — QUETIAPINE FUMARATE 25 MG PO TABS
50.0000 mg | ORAL_TABLET | Freq: Two times a day (BID) | ORAL | Status: DC
  Administered 2019-02-24 – 2019-03-04 (×15): 50 mg via ORAL
  Filled 2019-02-24 (×16): qty 2

## 2019-02-24 MED ORDER — INSULIN GLARGINE 100 UNIT/ML ~~LOC~~ SOLN
20.0000 [IU] | Freq: Every day | SUBCUTANEOUS | Status: DC
  Administered 2019-02-24: 20 [IU] via SUBCUTANEOUS
  Filled 2019-02-24 (×2): qty 0.2

## 2019-02-24 MED ORDER — INSULIN ASPART 100 UNIT/ML ~~LOC~~ SOLN
0.0000 [IU] | Freq: Every day | SUBCUTANEOUS | Status: DC
  Administered 2019-02-25: 2 [IU] via SUBCUTANEOUS
  Administered 2019-02-26: 3 [IU] via SUBCUTANEOUS
  Administered 2019-02-27: 22:00:00 2 [IU] via SUBCUTANEOUS
  Administered 2019-03-01 – 2019-03-03 (×3): 3 [IU] via SUBCUTANEOUS

## 2019-02-24 MED ORDER — INSULIN ASPART 100 UNIT/ML ~~LOC~~ SOLN
0.0000 [IU] | Freq: Three times a day (TID) | SUBCUTANEOUS | Status: DC
  Administered 2019-02-24 (×2): 4 [IU] via SUBCUTANEOUS
  Administered 2019-02-25: 7 [IU] via SUBCUTANEOUS
  Administered 2019-02-25: 11 [IU] via SUBCUTANEOUS
  Administered 2019-02-26: 4 [IU] via SUBCUTANEOUS
  Administered 2019-02-26: 11 [IU] via SUBCUTANEOUS
  Administered 2019-02-26 – 2019-02-27 (×2): 7 [IU] via SUBCUTANEOUS
  Administered 2019-02-27 – 2019-02-28 (×5): 4 [IU] via SUBCUTANEOUS
  Administered 2019-03-01: 7 [IU] via SUBCUTANEOUS
  Administered 2019-03-01: 1 [IU] via SUBCUTANEOUS
  Administered 2019-03-02: 11 [IU] via SUBCUTANEOUS
  Administered 2019-03-02: 3 [IU] via SUBCUTANEOUS
  Administered 2019-03-02: 15 [IU] via SUBCUTANEOUS
  Administered 2019-03-03: 7 [IU] via SUBCUTANEOUS
  Administered 2019-03-03: 3 [IU] via SUBCUTANEOUS
  Administered 2019-03-03: 20 [IU] via SUBCUTANEOUS

## 2019-02-24 NOTE — Progress Notes (Signed)
Daughter in law, Carver,  at bedside.  States pt is hallucinating, talking about people who have died in present tense and is being uncooperative.

## 2019-02-24 NOTE — Progress Notes (Signed)
Inpatient Diabetes Program Recommendations  AACE/ADA: New Consensus Statement on Inpatient Glycemic Control (2015)  Target Ranges:  Prepandial:   less than 140 mg/dL      Peak postprandial:   less than 180 mg/dL (1-2 hours)      Critically ill patients:  140 - 180 mg/dL   Results for FIDELIS, LOTH (MRN [id number]) as of 02/24/2019 09:28  Ref. Range 02/23/2019 08:18 02/23/2019 12:01 02/23/2019 16:51 02/23/2019 21:21 02/24/2019 08:06  Glucose-Capillary Latest Ref Range: 70 - 99 mg/dL 145 (H) 142 (H) 253 (H) 276 (H) 218 (H)   Review of Glycemic Control  Diabetes history: DM 2 Outpatient Diabetes medications: Metformin 1000 mg bid, 70/30 18 units bid Current orders for Inpatient glycemic control:   A1c 7.1% on 7/18  Inpatient Diabetes Program Recommendations:     IV solumedrol 1000 mg Q24. Glucose trends 200's. Patient is on more insulin at home. Consider Lantus 16 units and Novolog 0-5 units qhs.  Thanks,  Tama Headings RN, MSN, BC-ADM Inpatient Diabetes Coordinator Team Pager [phone] (8a-5p)

## 2019-02-24 NOTE — Progress Notes (Signed)
Pt was hallucinating during the night, asking about the washing machine in the room, bees flying in her hair, a goat and hundreds of people in the room.  Per son's report.

## 2019-02-25 LAB — CBC WITH DIFFERENTIAL/PLATELET
Abs Immature Granulocytes: 0.04 10*3/uL (ref 0.00–0.07)
Basophils Absolute: 0 10*3/uL (ref 0.0–0.1)
Basophils Relative: 0 %
Eosinophils Absolute: 0 10*3/uL (ref 0.0–0.5)
Eosinophils Relative: 0 %
HCT: 35 % — ABNORMAL LOW (ref 36.0–46.0)
Hemoglobin: 11.1 g/dL — ABNORMAL LOW (ref 12.0–15.0)
Immature Granulocytes: 0 %
Lymphocytes Relative: 4 %
Lymphs Abs: 0.4 10*3/uL — ABNORMAL LOW (ref 0.7–4.0)
MCH: 27.4 pg (ref 26.0–34.0)
MCHC: 31.7 g/dL (ref 30.0–36.0)
MCV: 86.4 fL (ref 80.0–100.0)
Monocytes Absolute: 0.2 10*3/uL (ref 0.1–1.0)
Monocytes Relative: 2 %
Neutro Abs: 9.4 10*3/uL — ABNORMAL HIGH (ref 1.7–7.7)
Neutrophils Relative %: 94 %
Platelets: 462 10*3/uL — ABNORMAL HIGH (ref 150–400)
RBC: 4.05 MIL/uL (ref 3.87–5.11)
RDW: 16 % — ABNORMAL HIGH (ref 11.5–15.5)
WBC: 10.1 10*3/uL (ref 4.0–10.5)
nRBC: 0 % (ref 0.0–0.2)

## 2019-02-25 LAB — COMPREHENSIVE METABOLIC PANEL
ALT: 54 U/L — ABNORMAL HIGH (ref 0–44)
AST: 41 U/L (ref 15–41)
Albumin: 3.4 g/dL — ABNORMAL LOW (ref 3.5–5.0)
Alkaline Phosphatase: 81 U/L (ref 38–126)
Anion gap: 16 — ABNORMAL HIGH (ref 5–15)
BUN: 13 mg/dL (ref 6–20)
CO2: 16 mmol/L — ABNORMAL LOW (ref 22–32)
Calcium: 9.5 mg/dL (ref 8.9–10.3)
Chloride: 105 mmol/L (ref 98–111)
Creatinine, Ser: 0.64 mg/dL (ref 0.44–1.00)
GFR calc Af Amer: >60 mL/min (ref >60)
GFR calc non Af Amer: >60 mL/min (ref >60)
Glucose, Bld: 295 mg/dL — ABNORMAL HIGH (ref 70–99)
Potassium: 3.9 mmol/L (ref 3.5–5.1)
Sodium: 137 mmol/L (ref 135–145)
Total Bilirubin: 0.5 mg/dL (ref 0.3–1.2)
Total Protein: 7.5 g/dL (ref 6.5–8.1)

## 2019-02-25 LAB — GLUCOSE, CAPILLARY
Glucose-Capillary: 284 mg/dL — ABNORMAL HIGH (ref 70–99)
Glucose-Capillary: 289 mg/dL — ABNORMAL HIGH (ref 70–99)
Glucose-Capillary: 205 mg/dL — ABNORMAL HIGH (ref 70–99)
Glucose-Capillary: 223 mg/dL — ABNORMAL HIGH (ref 70–99)

## 2019-02-25 MED ORDER — INSULIN GLARGINE 100 UNIT/ML ~~LOC~~ SOLN
25.0000 [IU] | Freq: Every day | SUBCUTANEOUS | Status: DC
  Administered 2019-02-25 – 2019-02-28 (×4): 25 [IU] via SUBCUTANEOUS
  Filled 2019-02-25 (×5): qty 0.25

## 2019-02-25 NOTE — TOC Initial Note (Signed)
Transition of Care [facility]) - Initial/Assessment Note    Patient Details  Name: Victoria Cox MRN: [id number] Date of Birth: [birth year]  Transition of Care [facility]) CM/SW Contact:    Benard Halsted, LCSW Phone Number: 02/25/2019, 1:04 PM  Clinical Narrative:                 Patient is active with [facility] and will resume services at discharge. She received DME upon last admission.   Expected Discharge Plan: [place] Barriers to Discharge: Continued Medical Work up   Patient Goals and CMS Choice Patient states their goals for this hospitalization and ongoing recovery are:: Return home per daughter in law CMS Medicare.gov Compare Post Acute Care list provided to:: Patient Represenative (must comment) Choice offered to / list presented to : NA  Expected Discharge Plan and Services Expected Discharge Plan: [place] In-house Referral: Interpreting Services Discharge Planning Services: CM Consult Post Acute Care Choice: [place] arrangements for the past 2 months: Single Family Home                 DME Arranged: N/A(Received last admission) DME Agency: NA       HH Arranged: RN, PT, Nurse's Aide [place] Agency: [facility] [employer] Date [place]: 02/25/19 Time [place]: [place] Representative spoke with at [place]: [place]  Prior Living Arrangements/Services Living arrangements for the past 2 months: [place] with:: Self, Adult Children Patient language and need for interpreter reviewed:: Yes(Gujarati) Do you feel safe going back to the place where you live?: Yes      Need for Family Participation in Patient Care: Yes (Comment) Care giver support system in place?: Yes (comment) Current home services: Home PT, Home RN Criminal Activity/Legal Involvement Pertinent to Current Situation/Hospitalization: No - Comment as needed  Activities of Daily Living Home Assistive  Devices/Equipment: None ADL Screening (condition at time of admission) Patient's cognitive ability adequate to safely complete daily activities?: No Is the patient deaf or have difficulty hearing?: No Does the patient have difficulty seeing, even when wearing glasses/contacts?: No Does the patient have difficulty concentrating, remembering, or making decisions?: Yes Patient able to express need for assistance with ADLs?: No Does the patient have difficulty dressing or bathing?: Yes Independently performs ADLs?: No Communication: Needs assistance Is this a change from baseline?: Change from baseline, expected to last >3 days Dressing (OT): Needs assistance Is this a change from baseline?: Change from baseline, expected to last >3 days Grooming: Needs assistance Is this a change from baseline?: Change from baseline, expected to last >3 days Feeding: Needs assistance Is this a change from baseline?: Change from baseline, expected to last >3 days Bathing: Needs assistance Is this a change from baseline?: Change from baseline, expected to last >3 days Toileting: Needs assistance Is this a change from baseline?: Change from baseline, expected to last >3days In/Out Bed: Needs assistance Is this a change from baseline?: Change from baseline, expected to last >3 days Walks in Home: Independent Does the patient have difficulty walking or climbing stairs?: Yes Weakness of Legs: Both Weakness of Arms/Hands: Both  Permission Sought/Granted Permission sought to share information with : Facility Sport and exercise psychologist, Family Supports    Share Information with NAME: Hiral  Permission granted to share info w AGENCY: [facility]        Emotional Assessment Appearance:: Appears stated age Attitude/Demeanor/Rapport: Unable to Assess Affect (typically  observed): Unable to Assess Orientation: : Oriented to Self Alcohol / Substance Use: Not Applicable Psych Involvement: Yes (comment)(Seen for  confusion and cleared)  Admission diagnosis:  Encephalopathy acute [G93.40] Patient Active Problem List   Diagnosis Date Noted  . Altered mental status 02/22/2019  . Acute respiratory insufficiency   . Acute metabolic encephalopathy   . Hyponatremia   . Stroke (HCC) 02/15/2019  . Generalized weakness 02/12/2019   PCP:  [name], MD Pharmacy:   [facility] [address], [place] - [address] [place] [address] Phone: [phone] Fax: [phone]     Social Determinants of Health (SDOH) Interventions    Readmission Risk Interventions Readmission Risk Prevention Plan 02/25/2019  Transportation Screening Complete  PCP or Specialist Appt within 3-5 Days Complete  HRI or Home Care Consult Complete  Social Work Consult for Recovery Care Planning/Counseling Complete  Palliative Care Screening Not Applicable  Medication Review Oceanographer) Complete  Some recent data might be hidden

## 2019-02-25 NOTE — Progress Notes (Signed)
Ref: [name], MD   Subjective:  Small improvement in aggressive behavior but hallucinations continue. Improving electrolytes and stable CBC with borderline low Hgb and mildly elevated platelets count on blood work today. Discussed care with daughter in law today and yesterday and with son yesterday.  Objective:  Vital Signs in the last 24 hours: Temp:  [97.6 F (36.4 C)-98.7 F (37.1 C)] 97.6 F (36.4 C) (07/31 1231) Pulse Rate:  [106-108] 106 (07/31 1231) Cardiac Rhythm: Normal sinus rhythm (07/30 2010) Resp:  [18-20] 20 (07/31 1231) BP: (134)/(83-96) 134/96 (07/31 1231) SpO2:  [97 %-100 %] 100 % (07/31 1231)  Physical Exam: BP Readings from Last 1 Encounters:  02/25/19 (!) 134/96     Wt Readings from Last 1 Encounters:  02/22/19 82.4 kg    Weight change:  Body mass index is 32.18 kg/m. HEENT: [place]/AT, Eyes-Brown, Conjunctiva-Pink, Sclera-Non-icteric Neck: No JVD, No bruit, Trachea midline. Lungs:  Clear, Bilateral. Cardiac:  Regular rhythm, normal S1 and S2, no S3. II/VI systolic murmur. Abdomen:  Soft, non-tender. BS present. Extremities:  No edema present. No cyanosis. No clubbing. Right forearm IV is out and site is non-tender CNS: AxOx1, Cranial nerves grossly intact, moves all 4 extremities.  Skin: Warm and dry.   Intake/Output from previous day: 07/30 0701 - 07/31 0700 In: 1321.1 [P.O.:480; I.V.:786; IV Piggyback:55.1] Out: -     Lab Results: BMET    Component Value Date/Time   NA 137 02/25/2019 0438   NA 134 (L) 02/21/2019 2354   NA 133 (L) 02/21/2019 2345   NA 139 11/30/2014 1001   K 3.9 02/25/2019 0438   K 3.8 02/21/2019 2354   K 3.8 02/21/2019 2345   CL 105 02/25/2019 0438   CL 97 (L) 02/21/2019 2345   CL 102 02/21/2019 0548   CO2 16 (L) 02/25/2019 0438   CO2 20 (L) 02/21/2019 2345   CO2 24 02/21/2019 0548   GLUCOSE 295 (H) 02/25/2019 0438   GLUCOSE 184 (H) 02/21/2019 2345   GLUCOSE 152 (H) 02/21/2019 0548   BUN 13 02/25/2019 0438   BUN 7 02/21/2019 2345   BUN 6 02/21/2019 0548   BUN 11 11/30/2014 1001   CREATININE 0.64 02/25/2019 0438   CREATININE 0.60 02/22/2019 0825   CREATININE 0.65 02/21/2019 2345   CALCIUM 9.5 02/25/2019 0438   CALCIUM 9.4 02/21/2019 2345   CALCIUM 8.9 02/21/2019 0548   GFRNONAA >60 02/25/2019 0438   GFRNONAA >60 02/22/2019 0825   GFRNONAA >60 02/21/2019 2345   GFRAA >60 02/25/2019 0438   GFRAA >60 02/22/2019 0825   GFRAA >60 02/21/2019 2345   CBC    Component Value Date/Time   WBC 10.1 02/25/2019 0438   RBC 4.05 02/25/2019 0438   HGB 11.1 (L) 02/25/2019 0438   HGB 12.5 11/30/2014 1001   HCT 35.0 (L) 02/25/2019 0438   HCT 36.2 02/22/2019 0825   PLT 462 (H) 02/25/2019 0438   PLT 369 11/30/2014 1001   MCV 86.4 02/25/2019 0438   MCV 83 11/30/2014 1001   MCH 27.4 02/25/2019 0438   MCHC 31.7 02/25/2019 0438   RDW 16.0 (H) 02/25/2019 0438   RDW 14.1 11/30/2014 1001   LYMPHSABS 0.4 (L) 02/25/2019 0438   LYMPHSABS 2.2 11/30/2014 1001   MONOABS 0.2 02/25/2019 0438   EOSABS 0.0 02/25/2019 0438   EOSABS 0.2 11/30/2014 1001   BASOSABS 0.0 02/25/2019 0438   BASOSABS 0.0 11/30/2014 1001   HEPATIC Function Panel Recent Labs    02/18/19 0459 02/21/19 2345 02/25/19  0438  PROT 6.7 8.8* 7.5   HEMOGLOBIN A1C No components found for: HGA1C,  MPG CARDIAC ENZYMES Lab Results  Component Value Date   CKTOTAL 18 (L) 02/14/2019   CKMB 0.9 02/14/2019   BNP No results for input(s): PROBNP in the last 8760 hours. TSH Recent Labs    02/13/19 0629 02/21/19 2327  TSH 0.367 1.696   CHOLESTEROL Recent Labs    02/16/19 0639  CHOL 167    Scheduled Meds: . amLODipine  5 mg Oral QPM  . aspirin  325 mg Oral Daily  . clopidogrel  75 mg Oral Daily  . docusate sodium  100 mg Oral BID  . famotidine  20 mg Oral BID  . feeding supplement  1 Container Oral BID BM  . folic acid  2 mg Oral Daily  . heparin  5,000 Units Subcutaneous Q8H  . insulin aspart  0-20 Units Subcutaneous TID WC  .  insulin aspart  0-5 Units Subcutaneous QHS  . insulin glargine  25 Units Subcutaneous QHS  . irbesartan  150 mg Oral Daily  . levothyroxine  75 mcg Oral Daily  . metFORMIN  1,000 mg Oral BID WC  . metoprolol succinate  50 mg Oral Daily  . multivitamin with minerals  1 tablet Oral Daily  . oxyCODONE  5 mg Oral Once  . pantoprazole  40 mg Oral Daily  . polyethylene glycol  17 g Oral Daily  . potassium chloride  10 mEq Oral Daily  . QUEtiapine  50 mg Oral BID   Continuous Infusions: . 0.9 % NaCl with KCl 20 mEq / L 20 mL/hr at 02/25/19 1530  . 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 Encourage activity and feeding. Sitter for safety.    LOS: 3 days   Time spent including chart review, lab review, examination, discussion with patient : 40  min   Dixie Dials  MD  02/25/2019, 5:54 PM

## 2019-02-25 NOTE — Progress Notes (Signed)
Pt without safety sitter for majority of day. Family has been trying to stay with patient. Assured family we would keep patient safe using mits and bed alarms if patient's sitter is unavailable.

## 2019-02-25 NOTE — Care Management (Signed)
Nurse Tech was walking patient to the restroom when patient began to urinate on herself. Patient was already agitated with having to walk withy her IV Pole. Ounce in the restroom the patient finished using the restroom and I proceeded to clean her up. Patient did not want to wear a gown and no clothes were left by the family. Patient does have a shirt on and gown wrapped around her. Will contact family to bring clothes.

## 2019-02-25 NOTE — Progress Notes (Signed)
Late entry Ref: [name], MD   Subjective:  Increasing confusion and visual hallucination. VS stable. Good BP control and mildly tacycardic. Oral intake is variable per nurse.  Objective:  Vital Signs in the last 24 hours: Temp:  [97.6 F (36.4 C)-98.7 F (37.1 C)] 97.6 F (36.4 C) (07/31 1231) Pulse Rate:  [106-108] 106 (07/31 1231) Cardiac Rhythm: Normal sinus rhythm (07/30 2010) Resp:  [18-20] 20 (07/31 1231) BP: (134)/(83-96) 134/96 (07/31 1231) SpO2:  [97 %-100 %] 100 % (07/31 1231)  Physical Exam: BP Readings from Last 1 Encounters:  02/25/19 (!) 134/96     Wt Readings from Last 1 Encounters:  02/22/19 82.4 kg    Weight change:  Body mass index is 32.18 kg/m. HEENT: [place]/AT, Eyes-Brown, PERL, EOMI, Conjunctiva-Pink, Sclera-Non-icteric Neck: No JVD, No bruit, Trachea midline. Lungs:  Clear, Bilateral. Cardiac:  Regular rhythm, normal S1 and S2, no S3. II/VI systolic murmur. Abdomen:  Soft, non-tender. BS present. Extremities:  No edema present. No cyanosis. No clubbing. Mild right forearm swelling and tenderness but IV flushes OK. CNS: AxOx1, Cranial nerves grossly intact, moves all 4 extremities.  Skin: Warm and dry.   Intake/Output from previous day: 07/30 0701 - 07/31 0700 In: 1321.1 [P.O.:480; I.V.:786; IV Piggyback:55.1] Out: -     Lab Results: BMET    Component Value Date/Time   NA 137 02/25/2019 0438   NA 134 (L) 02/21/2019 2354   NA 133 (L) 02/21/2019 2345   NA 139 11/30/2014 1001   K 3.9 02/25/2019 0438   K 3.8 02/21/2019 2354   K 3.8 02/21/2019 2345   CL 105 02/25/2019 0438   CL 97 (L) 02/21/2019 2345   CL 102 02/21/2019 0548   CO2 16 (L) 02/25/2019 0438   CO2 20 (L) 02/21/2019 2345   CO2 24 02/21/2019 0548   GLUCOSE 295 (H) 02/25/2019 0438   GLUCOSE 184 (H) 02/21/2019 2345   GLUCOSE 152 (H) 02/21/2019 0548   BUN 13 02/25/2019 0438   BUN 7 02/21/2019 2345   BUN 6 02/21/2019 0548   BUN 11 11/30/2014 1001   CREATININE 0.64  02/25/2019 0438   CREATININE 0.60 02/22/2019 0825   CREATININE 0.65 02/21/2019 2345   CALCIUM 9.5 02/25/2019 0438   CALCIUM 9.4 02/21/2019 2345   CALCIUM 8.9 02/21/2019 0548   GFRNONAA >60 02/25/2019 0438   GFRNONAA >60 02/22/2019 0825   GFRNONAA >60 02/21/2019 2345   GFRAA >60 02/25/2019 0438   GFRAA >60 02/22/2019 0825   GFRAA >60 02/21/2019 2345   CBC    Component Value Date/Time   WBC 10.1 02/25/2019 0438   RBC 4.05 02/25/2019 0438   HGB 11.1 (L) 02/25/2019 0438   HGB 12.5 11/30/2014 1001   HCT 35.0 (L) 02/25/2019 0438   HCT 36.2 02/22/2019 0825   PLT 462 (H) 02/25/2019 0438   PLT 369 11/30/2014 1001   MCV 86.4 02/25/2019 0438   MCV 83 11/30/2014 1001   MCH 27.4 02/25/2019 0438   MCHC 31.7 02/25/2019 0438   RDW 16.0 (H) 02/25/2019 0438   RDW 14.1 11/30/2014 1001   LYMPHSABS 0.4 (L) 02/25/2019 0438   LYMPHSABS 2.2 11/30/2014 1001   MONOABS 0.2 02/25/2019 0438   EOSABS 0.0 02/25/2019 0438   EOSABS 0.2 11/30/2014 1001   BASOSABS 0.0 02/25/2019 0438   BASOSABS 0.0 11/30/2014 1001   HEPATIC Function Panel Recent Labs    02/18/19 0459 02/21/19 2345 02/25/19 0438  PROT 6.7 8.8* 7.5   HEMOGLOBIN A1C No components found for:  HGA1C,  MPG CARDIAC ENZYMES Lab Results  Component Value Date   CKTOTAL 18 (L) 02/14/2019   CKMB 0.9 02/14/2019   BNP No results for input(s): PROBNP in the last 8760 hours. TSH Recent Labs    02/13/19 0629 02/21/19 2327  TSH 0.367 1.696   CHOLESTEROL Recent Labs    02/16/19 0639  CHOL 167    Scheduled Meds: . amLODipine  5 mg Oral QPM  . aspirin  325 mg Oral Daily  . clopidogrel  75 mg Oral Daily  . docusate sodium  100 mg Oral BID  . famotidine  20 mg Oral BID  . feeding supplement  1 Container Oral BID BM  . folic acid  2 mg Oral Daily  . heparin  5,000 Units Subcutaneous Q8H  . insulin aspart  0-20 Units Subcutaneous TID WC  . insulin aspart  0-5 Units Subcutaneous QHS  . insulin glargine  25 Units Subcutaneous QHS   . irbesartan  150 mg Oral Daily  . levothyroxine  75 mcg Oral Daily  . metFORMIN  1,000 mg Oral BID WC  . metoprolol succinate  50 mg Oral Daily  . multivitamin with minerals  1 tablet Oral Daily  . oxyCODONE  5 mg Oral Once  . pantoprazole  40 mg Oral Daily  . polyethylene glycol  17 g Oral Daily  . potassium chloride  10 mEq Oral Daily  . QUEtiapine  50 mg Oral BID   Continuous Infusions: . 0.9 % NaCl with KCl 20 mEq / L 20 mL/hr at 02/25/19 1530  . methylPREDNISolone (SOLU-MEDROL) injection 1,000 mg (02/25/19 1535)   PRN Meds:.acetaminophen, guaiFENesin-dextromethorphan, haloperidol lactate, LORazepam, meclizine  Assessment/Plan: Altered mental status Visual hallucinations Generalized weakness, improving Rheumatoid arthritis Recent corona radiata lacunar infarct Viral encephalitis, less likely Auto immune encephalitis, more likely Possible adverse effect of Humira Hypertension Type 2 DM, uncontrolled secondary to steroid use Obesity Hypothyroidism Possible major depression with manic episodes  Increase Quetiapine dose Continue high dose methyl prednisone. Increase Lantus insulin dose   LOS: 2 days   Time spent including chart review, lab review, examination, discussion with patient :40  min   Dixie Dials  MD  02/25/2019, 5:45 PM

## 2019-02-26 LAB — GLUCOSE, CAPILLARY
Glucose-Capillary: 212 mg/dL — ABNORMAL HIGH (ref 70–99)
Glucose-Capillary: 190 mg/dL — ABNORMAL HIGH (ref 70–99)
Glucose-Capillary: 189 mg/dL — ABNORMAL HIGH (ref 70–99)
Glucose-Capillary: 268 mg/dL — ABNORMAL HIGH (ref 70–99)
Glucose-Capillary: 267 mg/dL — ABNORMAL HIGH (ref 70–99)

## 2019-02-26 NOTE — Progress Notes (Signed)
Subjective:  More appropriate today as per son.  Patient denies any chest pain or shortness of breath.  Denies any headaches.  Denies fever chills.  Objective:  Vital Signs in the last 24 hours: Temp:  [97.5 F (36.4 C)-97.6 F (36.4 C)] 97.5 F (36.4 C) (08/01 0500) Pulse Rate:  [92-106] 92 (08/01 0500) Resp:  [12-20] 12 (08/01 0500) BP: (134-145)/(92-107) 139/92 (08/01 0500) SpO2:  [97 %-100 %] 99 % (08/01 0500)  Intake/Output from previous day: 07/31 0701 - 08/01 0700 In: 120 [P.O.:120] Out: -  Intake/Output from this shift: No intake/output data recorded.  Physical Exam: Neck: no adenopathy, no carotid bruit, no JVD and supple, symmetrical, trachea midline Lungs: clear to auscultation bilaterally Heart: regular rate and rhythm, S1, S2 normal and 2/6 systolic murmur noted Abdomen: soft, non-tender; bowel sounds normal; no masses,  no organomegaly Extremities: extremities normal, atraumatic, no cyanosis or edema  Lab Results: Recent Labs    02/25/19 0438  WBC 10.1  HGB 11.1*  PLT 462*   Recent Labs    02/25/19 0438  NA 137  K 3.9  CL 105  CO2 16*  GLUCOSE 295*  BUN 13  CREATININE 0.64   No results for input(s): TROPONINI in the last 72 hours.  Invalid input(s): CK, MB Hepatic Function Panel Recent Labs    02/25/19 0438  PROT 7.5  ALBUMIN 3.4*  AST 41  ALT 54*  ALKPHOS 81  BILITOT 0.5   No results for input(s): CHOL in the last 72 hours. No results for input(s): PROTIME in the last 72 hours.  Imaging: Imaging results have been reviewed and No results found.  Cardiac Studies:  Assessment/Plan:  Resolving encephalitis Status post delirium Generalized weakness Rheumatoid arthritis Status post recent coronary radiator lacunar infarct Hypertension Diabetes mellitus Obesity Hypothyroidism Plan Increase ambulation as tolerated with assistance. Patient and family now willing for rehab.   LOS: 4 days    Charolette Forward 02/26/2019, 10:20  AM

## 2019-02-27 LAB — GLUCOSE, CAPILLARY
Glucose-Capillary: 179 mg/dL — ABNORMAL HIGH (ref 70–99)
Glucose-Capillary: 230 mg/dL — ABNORMAL HIGH (ref 70–99)
Glucose-Capillary: 157 mg/dL — ABNORMAL HIGH (ref 70–99)
Glucose-Capillary: 221 mg/dL — ABNORMAL HIGH (ref 70–99)

## 2019-02-27 LAB — CULTURE, BLOOD (ROUTINE X 2)
Culture: NO GROWTH
Culture: NO GROWTH
Special Requests: ADEQUATE

## 2019-02-27 NOTE — Progress Notes (Signed)
Subjective:  Victoria Cox alert and awake today and denies any chest pain or shortness of breath.  Objective:  Vital Signs in the last 24 hours: Temp:  [97.6 F (36.4 C)-97.7 F (36.5 C)] 97.7 F (36.5 C) (08/02 0640) Pulse Rate:  [91-105] 99 (08/02 0640) Resp:  [14-18] 18 (08/02 0640) BP: (122-150)/(59-84) 150/84 (08/02 0640) SpO2:  [96 %-98 %] 97 % (08/02 0640)  Intake/Output from previous day: 08/01 0701 - 08/02 0700 In: 1218.2 [P.O.:540; I.V.:678.2] Out: -  Intake/Output from this shift: No intake/output data recorded.  Physical Exam: Neck: no adenopathy, no carotid bruit, no JVD and supple, symmetrical, trachea midline Lungs: clear to auscultation bilaterally Heart: regular rate and rhythm, S1, S2 normal and 2/6 systolic murmur noted Abdomen: soft, non-tender; bowel sounds normal; no masses,  no organomegaly Extremities: extremities normal, atraumatic, no cyanosis or edema  Lab Results: Recent Labs    02/25/19 0438  WBC 10.1  HGB 11.1*  PLT 462*   Recent Labs    02/25/19 0438  NA 137  K 3.9  CL 105  CO2 16*  GLUCOSE 295*  BUN 13  CREATININE 0.64   No results for input(s): TROPONINI in the last 72 hours.  Invalid input(s): CK, MB Hepatic Function Panel Recent Labs    02/25/19 0438  PROT 7.5  ALBUMIN 3.4*  AST 41  ALT 54*  ALKPHOS 81  BILITOT 0.5   No results for input(s): CHOL in the last 72 hours. No results for input(s): PROTIME in the last 72 hours.  Imaging: Imaging results have been reviewed and No results found.  Cardiac Studies:  Assessment/Plan:  Resolving encephalitis Status post delirium Generalized weakness Rheumatoid arthritis Status post recent coronary radiator lacunar infarct Hypertension Diabetes mellitus Obesity Hypothyroidism Plan Continue present management. Awaiting rehabilitation  LOS: 5 days    Charolette Forward 02/27/2019, 10:03 AM

## 2019-02-27 NOTE — Progress Notes (Signed)
Subjective: Patient is sitting up in chair comfortably. Able to answer some questions despite language barrier. Feels that she is better since admission. Somewhat slow to respond but unclear if that is due to her neuro status or language barrier.  Objective: Current vital signs: BP (!) 150/84 (BP Location: Right Arm)   Pulse 99   Temp 97.7 F (36.5 C) (Oral)   Resp 18   Ht 5\' 3"  (1.6 m)   Wt 82.4 kg   SpO2 97%   BMI 32.18 kg/m  Vital signs in last 24 hours: Temp:  [97.6 F (36.4 C)-97.7 F (36.5 C)] 97.7 F (36.5 C) (08/02 0640) Pulse Rate:  [91-105] 99 (08/02 0640) Resp:  [14-18] 18 (08/02 0640) BP: (122-150)/(59-84) 150/84 (08/02 0640) SpO2:  [96 %-98 %] 97 % (08/02 0640)  Exam: GEN: NAD, pleasant, cooperative CVS: RRR, no carotid bruit CHEST: No signs of resp distress, on room air ABD: Soft, NTTP  NEURO:  MENTAL STATUS: Awake, alert, oriented to name, place. Mildly delayed but seems to be improving. LANG/SPEECH: no dysarthria or dysphasia noted. CRANIAL NERVES:  II: Pupils equal and reactive, no RAPD, normal visual field and fundus  III, IV, VI: EOM intact, no gaze preference or deviation  V: normal  VII: no facial asymmetry  VIII: normal hearing to speech  MOTOR: 5/5 in both upper and lower extremities  REFLEXES: 2/4 throughout, bilateral flexor planters  SENSORY: Normal to touch, temperature & pin prick in all extremiteis  COORD: Normal finger to nose and heel to shin, no tremor, no dysmetria  Lab Results: Results for orders placed or performed during the hospital encounter of 02/21/19 (from the past 48 hour(s))  Glucose, capillary     Status: Abnormal   Collection Time: 02/25/19  9:07 AM  Result Value Ref Range   Glucose-Capillary 284 (H) 70 - 99 mg/dL  Glucose, capillary     Status: Abnormal   Collection Time: 02/25/19 12:29 PM  Result Value Ref Range   Glucose-Capillary 289 (H) 70 - 99 mg/dL  Glucose, capillary     Status: Abnormal   Collection Time:  02/25/19  6:39 PM  Result Value Ref Range   Glucose-Capillary 205 (H) 70 - 99 mg/dL  Glucose, capillary     Status: Abnormal   Collection Time: 02/25/19  9:15 PM  Result Value Ref Range   Glucose-Capillary 223 (H) 70 - 99 mg/dL  Glucose, capillary     Status: Abnormal   Collection Time: 02/26/19  6:30 AM  Result Value Ref Range   Glucose-Capillary 212 (H) 70 - 99 mg/dL   Comment 1 Notify RN    Comment 2 Document in Chart   Glucose, capillary     Status: Abnormal   Collection Time: 02/26/19  8:26 AM  Result Value Ref Range   Glucose-Capillary 190 (H) 70 - 99 mg/dL  Glucose, capillary     Status: Abnormal   Collection Time: 02/26/19  1:09 PM  Result Value Ref Range   Glucose-Capillary 189 (H) 70 - 99 mg/dL  Glucose, capillary     Status: Abnormal   Collection Time: 02/26/19  5:25 PM  Result Value Ref Range   Glucose-Capillary 268 (H) 70 - 99 mg/dL  Glucose, capillary     Status: Abnormal   Collection Time: 02/26/19  9:37 PM  Result Value Ref Range   Glucose-Capillary 267 (H) 70 - 99 mg/dL    Recent Results (from the past 240 hour(s))  Culture, blood (Routine x 2)  Status: None   Collection Time: 02/21/19 11:45 PM   Specimen: BLOOD LEFT ARM  Result Value Ref Range Status   Specimen Description BLOOD LEFT ARM  Final   Special Requests   Final    BOTTLES DRAWN AEROBIC AND ANAEROBIC Blood Culture adequate volume   Culture   Final    NO GROWTH 5 DAYS Performed at [facility], [address] [place], [place] [address]    Report Status 02/27/2019 FINAL  Final  Culture, blood (Routine x 2)     Status: None   Collection Time: 02/22/19  8:25 AM   Specimen: BLOOD RIGHT HAND  Result Value Ref Range Status   Specimen Description BLOOD RIGHT HAND  Final   Special Requests   Final    BOTTLES DRAWN AEROBIC ONLY Blood Culture results may not be optimal due to an inadequate volume of blood received in culture bottles   Culture   Final    NO GROWTH 5 DAYS Performed at [facility], [facility] [address] [place], [place] [address]    Report Status 02/27/2019 FINAL  Final   Medications:  Scheduled: . amLODipine  5 mg Oral QPM  . aspirin  325 mg Oral Daily  . clopidogrel  75 mg Oral Daily  . docusate sodium  100 mg Oral BID  . famotidine  20 mg Oral BID  . feeding supplement  1 Container Oral BID BM  . folic acid  2 mg Oral Daily  . heparin  5,000 Units Subcutaneous Q8H  . insulin aspart  0-20 Units Subcutaneous TID WC  . insulin aspart  0-5 Units Subcutaneous QHS  . insulin glargine  25 Units Subcutaneous QHS  . irbesartan  150 mg Oral Daily  . levothyroxine  75 mcg Oral Daily  . metFORMIN  1,000 mg Oral BID WC  . metoprolol succinate  50 mg Oral Daily  . multivitamin with minerals  1 tablet Oral Daily  . oxyCODONE  5 mg Oral Once  . pantoprazole  40 mg Oral Daily  . polyethylene glycol  17 g Oral Daily  . potassium chloride  10 mEq Oral Daily  . QUEtiapine  50 mg Oral BID   Continuous: . 0.9 % NaCl with KCl 20 mEq / L 20 mL/hr at 02/27/19 0105  . methylPREDNISolone (SOLU-MEDROL) injection 1,000 mg (02/26/19 1338)   Assessment: 58 year old female re-presenting with persistent confusion, with concern for autoimmune encephalitis  1. There is a waxing-waning component per her son. She at times is disoriented, at other times relatively well functioning, but history is not consistent with her ever fully returning to cognitive baseline after initial presentation to the hospital last admission.  2. MRI brain findings are suggestive of a diffuse CNS inflammatory process affecting the grey matter.  3. Her persistent AMS, MRI findings suggestive of diffuse cortical edema, markedly elevated WBC in conjunction with elevated protein in the absence of fever or meningismus, as well as monocytic predominance of the CSF WBCs is most consistent with an inflammatory process. 4. Also in support of an autoimmune encephalopathy, she has a pre-existing autoimmune condition  (RA) which predisposes to additional autoimmune conditions.   Recommendations: 1. Continue IV Solumedrol 1000 mg qd x 5 days, currently on day 4/5.  2. Appears to be improving. 3. Neurology will continue to follow.     LOS: 5 days    Electronically signed: Dr. Kerney Elbe

## 2019-02-27 NOTE — Progress Notes (Signed)
Patient had a witnessed telesitter fall at [place]. Patient was sitting in chair and tried to get up on her own. The telesitter was not telling the patient to sit down or not to get up, as witnessed by staff on the unit. Telesitter was calling me as I was running down the hall to check on patient, as I could hear the alarm. That was the only call to notify me. Chair alarm was in place. Patient did not have socks on, as she had refused them and requested to wear her flip flops when getting up. Patient was found sitting on the floor next to the recliner chair. Patient denied pain and VS were stable. Patient did not allow writer to assess her for injuries. Telesitter stated that she fell to the ground but did not hit her head. MD Harwani notified. Attempted to call daughter-in-law with no answer at this time (voicemail box was full), so will try again shortly. Placed mats next to bedsides and ordered a low bed.

## 2019-02-28 LAB — GLUCOSE, CAPILLARY
Glucose-Capillary: 185 mg/dL — ABNORMAL HIGH (ref 70–99)
Glucose-Capillary: 171 mg/dL — ABNORMAL HIGH (ref 70–99)
Glucose-Capillary: 174 mg/dL — ABNORMAL HIGH (ref 70–99)
Glucose-Capillary: 156 mg/dL — ABNORMAL HIGH (ref 70–99)

## 2019-02-28 NOTE — Progress Notes (Signed)
Pt daughter in law was questioning possible IV steroid tapering. According to MD notes, pt received 5 days course of IV steroids. Rory Percy, MD confirmed what was previously stated in physician notes and did not think pt needs to be tapered. Will fallow with rheumatologist outpatient where further treatment will be established. Daughter in law informed.  She was also very concerned about not having safety sitter in pt room. RN explained to her that confused pt not necessarily have to have person sitting in their room, due to staffing shortage. Tele monitor ordered and placed in pt room, bed alarm in place and working appropriately, pt on low bed and floor mats in place.  Pt alert and oriented to self and place, fallow commands, not attempting to get out of bed since beginning of the shift.  Will continue to monitor pt closely.

## 2019-02-28 NOTE — Progress Notes (Signed)
Ref: Victoria Cobb, MD   Subjective:  Awake and feeding self but do not like hospital food. Afebrile. Somconfusion and weakness persist.  Objective:  Vital Signs in the last 24 hours: Temp:  [97.7 F (36.5 C)-98 F (36.7 C)] 97.9 F (36.6 C) (08/03 0944) Pulse Rate:  [89-109] 109 (08/03 0944) Resp:  [18-19] 18 (08/03 0944) BP: (131-146)/(75-86) 131/79 (08/03 0944) SpO2:  [97 %-98 %] 97 % (08/03 0944)  Physical Exam: BP Readings from Last 1 Encounters:  02/28/19 131/79     Wt Readings from Last 1 Encounters:  02/22/19 82.4 kg    Weight change:  Body mass index is 32.18 kg/m. HEENT: [place]/AT, Eyes-Brown, PERL, EOMI, Conjunctiva-Pink, Sclera-Non-icteric Neck: No JVD, No bruit, Trachea midline. Lungs:  Clear, Bilateral. Cardiac:  Regular rhythm, normal S1 and S2, no S3. II/VI systolic murmur. Abdomen:  Soft, non-tender. BS present. Extremities:  No edema present. No cyanosis. No clubbing. CNS: AxOx2, Cranial nerves grossly intact, moves all 4 extremities.  Skin: Warm and dry.   Intake/Output from previous day: 08/02 0701 - 08/03 0700 In: 317.6 [I.V.:201.6; IV Piggyback:116] Out: -     Lab Results: BMET    Component Value Date/Time   NA 137 02/25/2019 0438   NA 134 (L) 02/21/2019 2354   NA 133 (L) 02/21/2019 2345   NA 139 11/30/2014 1001   K 3.9 02/25/2019 0438   K 3.8 02/21/2019 2354   K 3.8 02/21/2019 2345   CL 105 02/25/2019 0438   CL 97 (L) 02/21/2019 2345   CL 102 02/21/2019 0548   CO2 16 (L) 02/25/2019 0438   CO2 20 (L) 02/21/2019 2345   CO2 24 02/21/2019 0548   GLUCOSE 295 (H) 02/25/2019 0438   GLUCOSE 184 (H) 02/21/2019 2345   GLUCOSE 152 (H) 02/21/2019 0548   BUN 13 02/25/2019 0438   BUN 7 02/21/2019 2345   BUN 6 02/21/2019 0548   BUN 11 11/30/2014 1001   CREATININE 0.64 02/25/2019 0438   CREATININE 0.60 02/22/2019 0825   CREATININE 0.65 02/21/2019 2345   CALCIUM 9.5 02/25/2019 0438   CALCIUM 9.4 02/21/2019 2345   CALCIUM 8.9 02/21/2019 0548   GFRNONAA >60 02/25/2019 0438   GFRNONAA >60 02/22/2019 0825   GFRNONAA >60 02/21/2019 2345   GFRAA >60 02/25/2019 0438   GFRAA >60 02/22/2019 0825   GFRAA >60 02/21/2019 2345   CBC    Component Value Date/Time   WBC 10.1 02/25/2019 0438   RBC 4.05 02/25/2019 0438   HGB 11.1 (L) 02/25/2019 0438   HGB 12.5 11/30/2014 1001   HCT 35.0 (L) 02/25/2019 0438   HCT 36.2 02/22/2019 0825   PLT 462 (H) 02/25/2019 0438   PLT 369 11/30/2014 1001   MCV 86.4 02/25/2019 0438   MCV 83 11/30/2014 1001   MCH 27.4 02/25/2019 0438   MCHC 31.7 02/25/2019 0438   RDW 16.0 (H) 02/25/2019 0438   RDW 14.1 11/30/2014 1001   LYMPHSABS 0.4 (L) 02/25/2019 0438   LYMPHSABS 2.2 11/30/2014 1001   MONOABS 0.2 02/25/2019 0438   EOSABS 0.0 02/25/2019 0438   EOSABS 0.2 11/30/2014 1001   BASOSABS 0.0 02/25/2019 0438   BASOSABS 0.0 11/30/2014 1001   HEPATIC Function Panel Recent Labs    02/18/19 0459 02/21/19 2345 02/25/19 0438  PROT 6.7 8.8* 7.5   HEMOGLOBIN A1C No components found for: HGA1C,  MPG CARDIAC ENZYMES Lab Results  Component Value Date   CKTOTAL 18 (L) 02/14/2019   CKMB 0.9 02/14/2019   BNP No  results for input(s): PROBNP in the last 8760 hours. TSH Recent Labs    02/13/19 0629 02/21/19 2327  TSH 0.367 1.696   CHOLESTEROL Recent Labs    02/16/19 0639  CHOL 167    Scheduled Meds: . amLODipine  5 mg Oral QPM  . aspirin  325 mg Oral Daily  . clopidogrel  75 mg Oral Daily  . docusate sodium  100 mg Oral BID  . famotidine  20 mg Oral BID  . feeding supplement  1 Container Oral BID BM  . folic acid  2 mg Oral Daily  . heparin  5,000 Units Subcutaneous Q8H  . insulin aspart  0-20 Units Subcutaneous TID WC  . insulin aspart  0-5 Units Subcutaneous QHS  . insulin glargine  25 Units Subcutaneous QHS  . irbesartan  150 mg Oral Daily  . levothyroxine  75 mcg Oral Daily  . metFORMIN  1,000 mg Oral BID WC  . metoprolol succinate  50 mg Oral Daily  . multivitamin with minerals  1  tablet Oral Daily  . oxyCODONE  5 mg Oral Once  . pantoprazole  40 mg Oral Daily  . polyethylene glycol  17 g Oral Daily  . potassium chloride  10 mEq Oral Daily  . QUEtiapine  50 mg Oral BID   Continuous Infusions: . 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 Appreciate neurology consult.   LOS: 6 days   Time spent including chart review, lab review, examination, discussion with patient : 30 min   Dixie Dials  MD  02/28/2019, 12:26 PM

## 2019-02-28 NOTE — Progress Notes (Signed)
Subjective: Family reports significant improvement, close to normal.   She denies headache.  Exam: Vitals:   02/28/19 0611 02/28/19 0944  BP: (!) 146/86 131/79  Pulse: 89 (!) 109  Resp: 19 18  Temp: 98 F (36.7 C) 97.9 F (36.6 C)  SpO2: 97% 97%   Gen: In bed, NAD Resp: non-labored breathing, no acute distress Abd: soft, nt  Neuro: Exam is performed with the assistance of her son translating MS: awake, alert.  She is able to tell me the month, but does not answer when I asked her. CN: Visual fields are full, extraocular movements are full face is symmetric Motor: She has good strength throughout Sensory: She endorses symmetric sensation to light touch   Pertinent Labs: From 7/28:  C3, C4, anti-DNA, anti-Smith. ANA is positive with a 1-1 60 titer and homogeneous pattern ESR 42 CRP 2.3 Antiphospholipid antibody-pending  CSF from 7/22: WBC 425, 88% monocytes RBC 17 Protein 103 Glucose 46 Crypto antigen, CSF culture, HSV by PCR, fungal stain, AFB stain were all negative.  Impression: 58 year old female with a history of seronegative rheumatoid arthritis who recently weaned steroids and then presented with meningoencephalitis.  The WBC is very high for autoimmune meningoencephalitis, but given that the cultures were all negative and she seems to have responded well to steroids, I do think that this needs to be considered.  Recommendations: 1) complete 5 days of Solu-Medrol 2) she likely needs close follow-up with her rheumatologist 3) I will call him tomorrow to see if we have enough to send a CSF autoimmune panel as well as CSF rheumatoid factor 4) serum rheumatoid factor 5) Will follow.   Roland Rack, MD [facility] [phone]  If 7pm- 7am, please page neurology on call as listed in [place].

## 2019-03-01 ENCOUNTER — Inpatient Hospital Stay (HOSPITAL_COMMUNITY): Payer: Medicaid Other

## 2019-03-01 LAB — GLUCOSE, CAPILLARY
Glucose-Capillary: 63 mg/dL — ABNORMAL LOW (ref 70–99)
Glucose-Capillary: 90 mg/dL (ref 70–99)
Glucose-Capillary: 141 mg/dL — ABNORMAL HIGH (ref 70–99)
Glucose-Capillary: 220 mg/dL — ABNORMAL HIGH (ref 70–99)
Glucose-Capillary: 276 mg/dL — ABNORMAL HIGH (ref 70–99)

## 2019-03-01 LAB — ANTIPHOSPHOLIPID SYNDROME EVAL, BLD
Anticardiolipin IgA: <9 APL U/mL (ref 0–11)
Anticardiolipin IgG: <9 GPL U/mL (ref 0–14)
Anticardiolipin IgM: <9 MPL U/mL (ref 0–12)
DRVVT: 38.3 s (ref 0.0–47.0)
PTT Lupus Anticoagulant: 39.7 s (ref 0.0–51.9)
Phosphatydalserine, IgA: 3 APS IgA (ref 0–20)
Phosphatydalserine, IgG: 5 GPS IgG (ref 0–11)
Phosphatydalserine, IgM: 12 MPS IgM (ref 0–25)

## 2019-03-01 MED ORDER — INSULIN GLARGINE 100 UNIT/ML ~~LOC~~ SOLN
15.0000 [IU] | Freq: Every day | SUBCUTANEOUS | Status: DC
  Administered 2019-03-01 – 2019-03-03 (×3): 15 [IU] via SUBCUTANEOUS
  Filled 2019-03-01 (×4): qty 0.15

## 2019-03-01 MED ORDER — ATORVASTATIN CALCIUM 10 MG PO TABS
20.0000 mg | ORAL_TABLET | Freq: Every day | ORAL | Status: DC
  Administered 2019-03-01 – 2019-03-03 (×3): 20 mg via ORAL
  Filled 2019-03-01 (×3): qty 2

## 2019-03-01 MED ORDER — DEXTROSE-NACL 5-0.45 % IV SOLN
INTRAVENOUS | Status: DC
  Administered 2019-03-01: 20 mL/h via INTRAVENOUS

## 2019-03-01 MED ORDER — METHOTREXATE 2.5 MG PO TABS
20.0000 mg | ORAL_TABLET | Freq: Once | ORAL | Status: AC
  Administered 2019-03-02: 20 mg via ORAL
  Filled 2019-03-01: qty 8

## 2019-03-01 MED ORDER — INSULIN GLARGINE 100 UNIT/ML ~~LOC~~ SOLN: 20.0000 [IU] | Freq: Every day | SUBCUTANEOUS | Status: DC

## 2019-03-01 MED ORDER — PREDNISONE 20 MG PO TABS
40.0000 mg | ORAL_TABLET | Freq: Every day | ORAL | Status: DC
  Administered 2019-03-01 – 2019-03-04 (×4): 40 mg via ORAL
  Filled 2019-03-01 (×4): qty 2

## 2019-03-01 NOTE — Progress Notes (Signed)
OT Cancellation Note  Patient Details Name: Victoria Cox MRN: [id number] DOB: Oct 29, 1960   Cancelled Treatment:    Reason Eval/Treat Not Completed: Other (comment) Pt needing family to interpret due to dialect no available with in-person services or Ipad stratus teleinterpreter. Per nsg, son has left building for lunch. Will re-attempt as schedule allows and pt is appropriate.   Dorinda Hill OTR/L Acute Rehabilitation Services Office: [place] 03/01/2019, 1:38 PM

## 2019-03-01 NOTE — Progress Notes (Signed)
Nutrition Follow-up  DOCUMENTATION CODES:   Obesity unspecified  INTERVENTION:   -Continue Magic cup TID with meals, each supplement provides 290 kcal and 9 grams of protein -Continue Boost Breeze po BID, each supplement provides 250 kcal and 9 grams of protein -Continue MVI with minerals daily  NUTRITION DIAGNOSIS:   Predicted suboptimal nutrient intake related to lethargy/confusion as evidenced by per patient/family report.  Progressing   GOAL:   Patient will meet greater than or equal to 90% of their needs  Progressing   MONITOR:   PO intake, Supplement acceptance, Labs, Weight trends, Skin, I & O's  REASON FOR ASSESSMENT:   Malnutrition Screening Tool    ASSESSMENT:   58 year old female with recent admission for altered mental status and possible viral encephalitis, recent lacunar infarct of right corona radiata, had behavior changes with severe combativeness and confusion. She has Rheumatoid arthritis, type 2 DM, hypertension, obesity and hypothyroidism, She was gradually tapered off of steroids over 3 months when she was placed on Humira injections q 2 weeks. Post Haldol and lorazepam she is sedated and sleepy but periodically extends arms to catch something so she can reposition her body. Prior to discharge home today after 9 days in hospital and work up by neurology and infectious disease she had ambulated well, fed herself but had some confusion at night time during hospital stay. She was scheduled to go to inpatient rehab for weakness and had changed her mind over weekend to go home.  Reviewed I/O's: -540 ml x 24 hours and +4.4 L since admission  UOP: 900 ml x 24 hours  Per MD notes, pt's mental status is improved.   Pt able to self-feed, however, does not like the taste of hospital food. Oral intake has improved since admission; noted documented meal intake 50-100%. Pt is accepting Boost Breeze supplements.   Pt family agreeable to CIR vs SNF; plan for therapy  evaluations to determine most appropriate discharge disposition.   Medications reviewed and include 0.9% NaCl with KCl mE1/L @ 20 ml/hr.   Labs reviewed: CBGS: 63-174 (inpatient orders for glycemic control are 0-20 units insulin aspart TID with meals and 0-5 units insulin aspart q HS).   Diet Order:   Diet Order            Diet vegetarian Room service appropriate? Yes; Fluid consistency: Thin  Diet effective now              EDUCATION NEEDS:   No education needs have been identified at this time  Skin:  Skin Integrity Issues:: Incisions, Other (Comment) Incisions: closed rt groin Other: open lt arm wound  Last BM:  02/28/19  Height:   Ht Readings from Last 1 Encounters:  02/22/19 5\' 3"  (1.6 m)    Weight:   Wt Readings from Last 1 Encounters:  02/22/19 82.4 kg    Ideal Body Weight:  52.3 kg  BMI:  Body mass index is 32.18 kg/m.  Estimated Nutritional Needs:   Kcal:  1600-1800  Protein:  80-95 grams  Fluid:  1.6-1.8 L    Emanie Behan A. Jimmye Norman, RD, LDN, [place] Registered Dietitian II Certified Diabetes Care and Education Specialist Pager: [phone] After hours Pager: [phone]

## 2019-03-01 NOTE — Progress Notes (Signed)
Inpatient Diabetes Program Recommendations  AACE/ADA: New Consensus Statement on Inpatient Glycemic Control (2015)  Target Ranges:  Prepandial:   less than 140 mg/dL      Peak postprandial:   less than 180 mg/dL (1-2 hours)      Critically ill patients:  140 - 180 mg/dL   Lab Results  Component Value Date   GLUCAP 90 03/01/2019   HGBA1C 7.2 (H) 02/16/2019    Review of Glycemic Control Results for Victoria Cox, Victoria Cox (MRN [id number]) as of 03/01/2019 11:43  Ref. Range 02/28/2019 11:44 02/28/2019 16:51 02/28/2019 20:50 03/01/2019 08:00 03/01/2019 08:32  Glucose-Capillary Latest Ref Range: 70 - 99 mg/dL 171 (H) 174 (H) 156 (H) 63 (L) 90   Diabetes history: DM 2 Outpatient Diabetes medications:  Metformin 1000 mg bid Novolog 70/30 mix 18 units bid Current orders for Inpatient glycemic control:  Novolog resistant tid with meals and HS  Inpatient Diabetes Program Recommendations:    Note low blood sugar this AM.  Steroids have been stopped.  Please consider reducing Novolog correction to moderate tid with meals and HS.  Also please restart Lantus 15 units q HS.   Thanks  Adah Perl, RN, BC-ADM Inpatient Diabetes Coordinator Pager [phone] (8a-5p)

## 2019-03-01 NOTE — Progress Notes (Signed)
Ref: Dixie Dials, MD   Subjective:  Awake. Awaiting MRI of brain post discussion with neurology. Afebrile. Mild confusion persist. Eating well per family. Low Blood sugar levels..   Objective:  Vital Signs in the last 24 hours: Temp:  [97.6 F (36.4 C)-98.1 F (36.7 C)] 98.1 F (36.7 C) (08/04 0529) Pulse Rate:  [90-97] 90 (08/04 0529) Resp:  [14-16] 16 (08/04 0529) BP: (111-138)/(67-78) 127/69 (08/04 0529) SpO2:  [96 %-99 %] 99 % (08/04 0529)  Physical Exam: BP Readings from Last 1 Encounters:  03/01/19 127/69     Wt Readings from Last 1 Encounters:  02/22/19 82.4 kg    Weight change:  Body mass index is 32.18 kg/m. HEENT: /AT, Eyes-Brown, PERL, EOMI, Conjunctiva-Pink, Sclera-Non-icteric Neck: No JVD, No bruit, Trachea midline. Lungs:  Clear, Bilateral. Cardiac:  Regular rhythm, normal S1 and S2, no S3. II/VI systolic murmur. Abdomen:  Soft, non-tender. BS present. Extremities:  No edema present. No cyanosis. No clubbing. CNS: AxOx2, Cranial nerves grossly intact, moves all 4 extremities.  Skin: Warm and dry.   Intake/Output from previous day: 08/03 0701 - 08/04 0700 In: 360 [P.O.:360] Out: 900 [Urine:900]    Lab Results: BMET    Component Value Date/Time   NA 137 02/25/2019 0438   NA 134 (L) 02/21/2019 2354   NA 133 (L) 02/21/2019 2345   NA 139 11/30/2014 1001   K 3.9 02/25/2019 0438   K 3.8 02/21/2019 2354   K 3.8 02/21/2019 2345   CL 105 02/25/2019 0438   CL 97 (L) 02/21/2019 2345   CL 102 02/21/2019 0548   CO2 16 (L) 02/25/2019 0438   CO2 20 (L) 02/21/2019 2345   CO2 24 02/21/2019 0548   GLUCOSE 295 (H) 02/25/2019 0438   GLUCOSE 184 (H) 02/21/2019 2345   GLUCOSE 152 (H) 02/21/2019 0548   BUN 13 02/25/2019 0438   BUN 7 02/21/2019 2345   BUN 6 02/21/2019 0548   BUN 11 11/30/2014 1001   CREATININE 0.64 02/25/2019 0438   CREATININE 0.60 02/22/2019 0825   CREATININE 0.65 02/21/2019 2345   CALCIUM 9.5 02/25/2019 0438   CALCIUM 9.4 02/21/2019  2345   CALCIUM 8.9 02/21/2019 0548   GFRNONAA >60 02/25/2019 0438   GFRNONAA >60 02/22/2019 0825   GFRNONAA >60 02/21/2019 2345   GFRAA >60 02/25/2019 0438   GFRAA >60 02/22/2019 0825   GFRAA >60 02/21/2019 2345   CBC    Component Value Date/Time   WBC 10.1 02/25/2019 0438   RBC 4.05 02/25/2019 0438   HGB 11.1 (L) 02/25/2019 0438   HGB 12.5 11/30/2014 1001   HCT 35.0 (L) 02/25/2019 0438   HCT 36.2 02/22/2019 0825   PLT 462 (H) 02/25/2019 0438   PLT 369 11/30/2014 1001   MCV 86.4 02/25/2019 0438   MCV 83 11/30/2014 1001   MCH 27.4 02/25/2019 0438   MCHC 31.7 02/25/2019 0438   RDW 16.0 (H) 02/25/2019 0438   RDW 14.1 11/30/2014 1001   LYMPHSABS 0.4 (L) 02/25/2019 0438   LYMPHSABS 2.2 11/30/2014 1001   MONOABS 0.2 02/25/2019 0438   EOSABS 0.0 02/25/2019 0438   EOSABS 0.2 11/30/2014 1001   BASOSABS 0.0 02/25/2019 0438   BASOSABS 0.0 11/30/2014 1001   HEPATIC Function Panel Recent Labs    02/18/19 0459 02/21/19 2345 02/25/19 0438  PROT 6.7 8.8* 7.5   HEMOGLOBIN A1C No components found for: HGA1C,  MPG CARDIAC ENZYMES Lab Results  Component Value Date   CKTOTAL 18 (L) 02/14/2019   CKMB  0.9 02/14/2019   BNP No results for input(s): PROBNP in the last 8760 hours. TSH Recent Labs    02/13/19 0629 02/21/19 2327  TSH 0.367 1.696   CHOLESTEROL Recent Labs    02/16/19 0639  CHOL 167    Scheduled Meds: . amLODipine  5 mg Oral QPM  . aspirin  325 mg Oral Daily  . clopidogrel  75 mg Oral Daily  . docusate sodium  100 mg Oral BID  . famotidine  20 mg Oral BID  . feeding supplement  1 Container Oral BID BM  . folic acid  2 mg Oral Daily  . heparin  5,000 Units Subcutaneous Q8H  . insulin aspart  0-20 Units Subcutaneous TID WC  . insulin aspart  0-5 Units Subcutaneous QHS  . irbesartan  150 mg Oral Daily  . levothyroxine  75 mcg Oral Daily  . metoprolol succinate  50 mg Oral Daily  . multivitamin with minerals  1 tablet Oral Daily  . pantoprazole  40 mg  Oral Daily  . polyethylene glycol  17 g Oral Daily  . potassium chloride  10 mEq Oral Daily  . predniSONE  40 mg Oral QAC breakfast  . QUEtiapine  50 mg Oral BID   Continuous Infusions: . 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 Start prednisone 40 mg. daily with slow taper. Awaiting PT/OT evaluation for possible IP rehab.   LOS: 7 days   Time spent including chart review, lab review, examination, discussion with patient, family and consultants : 40  min   [name]  MD  03/01/2019, 12:25 PM

## 2019-03-01 NOTE — Progress Notes (Signed)
PT Cancellation Note  Patient Details Name: Victoria Cox MRN: [id number] DOB: [birth year]   Cancelled Treatment:    Reason Eval/Treat Not Completed: Other (comment). Pt needing family to interpret as language dialect not available via in-person services or Ipad stratus services. Per RN, son has left for lunch; will attempt to follow-up for PT evaluation when family present as schedule permits.  Mabeline Caras, PT, DPT Acute Rehabilitation Services  Pager [phone] Office [place] 03/01/2019, 1:03 PM

## 2019-03-01 NOTE — Progress Notes (Signed)
Subjective: Continues to improve  Exam: Vitals:   02/28/19 2343 03/01/19 0529  BP: 124/67 127/69  Pulse: 97 90  Resp: 14 16  Temp: 97.7 F (36.5 C) 98.1 F (36.7 C)  SpO2: 98% 99%   Gen: In bed, NAD Resp: non-labored breathing, no acute distress Abd: soft, nt  Neuro: MS: Awake, alert, answers quickly, oriented to month and year CN: Visual fields full, extraocular movements intact Motor: Moves all extremities well Sensory: Intact light touch  Pertinent Labs: From 7/28:  C3, C4, anti-DNA, anti-Smith. ANA is positive with a 1-1 60 titer and homogeneous pattern ESR 42 CRP 2.3 Antiphospholipid antibody-pending  CSF from 7/22: WBC 425, 88% monocytes RBC 17 Protein 103 Glucose 46 Crypto antigen, CSF culture, HSV by PCR, fungal stain, AFB stain were all negative.   Impression: 58 year old female with a history of seronegative rheumatoid arthritis who recently weaned steroids and then presented with meningoencephalitis.  The WBC is very high for autoimmune meningoencephalitis, but given that the cultures were all negative and she seems to have responded well to steroids, I do think that autoimmune process is most likely.  I would favor continuing steroids at a lower dose.  Given that she seems to be much improved, almost back to baseline, I would not favor more aggressive immunotherapy at this time.  MRI reveals a small infarct in the right parietal white matter, slightly larger than previous, but I do not think responsible for any of her symptoms this hospitalization.  She is already on dual antiplatelet therapy. Will also likely need statin.   Recommendations: 1) prednisone 40 mg daily x 4 weeks then 20 mg daily with further titration per her rheumatologist 2) I have added an autoimmune panel to be sent to [facility] for her CSF, also added a CSF rheumatoid factor 3) repeat MRI today to assess for changes associated limbic encephalitis  she can follow-up with Antony Contras for  neurological follow-up 4) continue dual antiplatelet therapy from a stroke perspective until August 9, following this monotherapy is appropriate for stroke prevention, but dual antiplatelet therapy could be continued if needed from other medical problems. 5) L:iekly will need statin therapy, started atorvastatin 92m , will check lipid panel in the am.   MRoland Rack MD [facility] [phone] If 7pm- 7am, please page neurology on call as listed in [place]

## 2019-03-01 NOTE — Progress Notes (Signed)
Hypoglycemic Event  CBG: 63  Treatment: 4 oz juice/soda  Symptoms: None  Follow-up CBG: Time: 0832 CBG Result:90  Possible Reasons for Event: Unknown  Comments/MD notified:[place], MD    [facility]

## 2019-03-02 LAB — CBC WITH DIFFERENTIAL/PLATELET
Abs Immature Granulocytes: 0.25 10*3/uL — ABNORMAL HIGH (ref 0.00–0.07)
Basophils Absolute: 0 10*3/uL (ref 0.0–0.1)
Basophils Relative: 1 %
Eosinophils Absolute: 0 10*3/uL (ref 0.0–0.5)
Eosinophils Relative: 0 %
HCT: 34.1 % — ABNORMAL LOW (ref 36.0–46.0)
Hemoglobin: 10.9 g/dL — ABNORMAL LOW (ref 12.0–15.0)
Immature Granulocytes: 4 %
Lymphocytes Relative: 12 %
Lymphs Abs: 0.7 10*3/uL (ref 0.7–4.0)
MCH: 27.5 pg (ref 26.0–34.0)
MCHC: 32 g/dL (ref 30.0–36.0)
MCV: 86.1 fL (ref 80.0–100.0)
Monocytes Absolute: 0.4 10*3/uL (ref 0.1–1.0)
Monocytes Relative: 7 %
Neutro Abs: 4.3 10*3/uL (ref 1.7–7.7)
Neutrophils Relative %: 76 %
Platelets: 345 10*3/uL (ref 150–400)
RBC: 3.96 MIL/uL (ref 3.87–5.11)
RDW: 16.4 % — ABNORMAL HIGH (ref 11.5–15.5)
WBC: 5.7 10*3/uL (ref 4.0–10.5)
nRBC: 0 % (ref 0.0–0.2)

## 2019-03-02 LAB — COMPREHENSIVE METABOLIC PANEL WITH GFR
ALT: 42 U/L (ref 0–44)
AST: 15 U/L (ref 15–41)
Albumin: 2.7 g/dL — ABNORMAL LOW (ref 3.5–5.0)
Alkaline Phosphatase: 73 U/L (ref 38–126)
Anion gap: 12 (ref 5–15)
BUN: 11 mg/dL (ref 6–20)
CO2: 24 mmol/L (ref 22–32)
Calcium: 9.1 mg/dL (ref 8.9–10.3)
Chloride: 101 mmol/L (ref 98–111)
Creatinine, Ser: 0.6 mg/dL (ref 0.44–1.00)
GFR calc Af Amer: >60 mL/min
GFR calc non Af Amer: >60 mL/min
Glucose, Bld: 183 mg/dL — ABNORMAL HIGH (ref 70–99)
Potassium: 4 mmol/L (ref 3.5–5.1)
Sodium: 137 mmol/L (ref 135–145)
Total Bilirubin: 0.3 mg/dL (ref 0.3–1.2)
Total Protein: 5.8 g/dL — ABNORMAL LOW (ref 6.5–8.1)

## 2019-03-02 LAB — LIPID PANEL
Cholesterol: 181 mg/dL (ref 0–200)
HDL: 52 mg/dL
LDL Cholesterol: 99 mg/dL (ref 0–99)
Total CHOL/HDL Ratio: 3.5 ratio
Triglycerides: 149 mg/dL
VLDL: 30 mg/dL (ref 0–40)

## 2019-03-02 LAB — GLUCOSE, CAPILLARY
Glucose-Capillary: 133 mg/dL — ABNORMAL HIGH (ref 70–99)
Glucose-Capillary: 281 mg/dL — ABNORMAL HIGH (ref 70–99)
Glucose-Capillary: 327 mg/dL — ABNORMAL HIGH (ref 70–99)
Glucose-Capillary: 240 mg/dL — ABNORMAL HIGH (ref 70–99)

## 2019-03-02 LAB — RHEUMATOID FACTOR: Rheumatoid fact SerPl-aCnc: <10 IU/mL (ref 0.0–13.9)

## 2019-03-02 MED ORDER — FAMOTIDINE 20 MG PO TABS
20.0000 mg | ORAL_TABLET | Freq: Every day | ORAL | Status: DC
  Administered 2019-03-02 – 2019-03-03 (×2): 20 mg via ORAL
  Filled 2019-03-02 (×2): qty 1

## 2019-03-02 NOTE — Evaluation (Signed)
Physical Therapy Evaluation Patient Details Name: Victoria Cox MRN: [id number] DOB: 13-Jan-1961 Today's Date: 03/02/2019   History of Present Illness  58 year old female with recent admission for altered mental status and possible viral encephalitis, recent lacunar infarct of right corona radiata, had behavior changes with severe combativeness and confusion, diffuse slowing on EEG; pt decided last minute to not pursue CIR. Additional CT 7/28 without acute change. MRI suggestive of diffuse CNS inflammatory process. Further workup in progress. Other PMH includes RA, DM2, HTN, obesity.  Clinical Impression  Pt is up to walk with a very altered gait and difficulty controlling LLE and walker.  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  Her plan is to continue rehab with acute therapy to progress with balance and gait quality, and to work toward a faster discharge home from rehab.      Follow Up Recommendations CIR;Supervision for mobility/OOB;Supervision/Assistance - 24 hour    Equipment Recommendations  None recommended by PT(await rehab disposition)    Recommendations for Other Services Rehab consult     Precautions / Restrictions Precautions Precautions: Fall Precaution Comments: family must translate Restrictions Weight Bearing Restrictions: No      Mobility  Bed Mobility Overal bed mobility: Needs Assistance Bed Mobility: Supine to Sit;Sit to Supine     Supine to sit: Mod assist Sit to supine: Min assist   General bed mobility comments: mod to bring trunk to upright  Transfers Overall transfer level: Needs assistance Equipment used: Rolling walker (2 wheeled) Transfers: Sit to/from Stand Sit to Stand: Min guard;Min assist         General transfer comment: min assist to control balance at times, may be impulsivity as much as  coordination  Ambulation/Gait Ambulation/Gait assistance: Min assist;+2 safety/equipment Gait Distance (Feet): 100 Feet Assistive device: Rolling walker (2 wheeled) Gait Pattern/deviations: Step-through pattern Gait velocity: reduced Gait velocity interpretation: <1.8 ft/sec, indicate of risk for recurrent falls General Gait Details: pt is crossing LLE over and cannot control walker to L well, coordination but less related to strength and more gross control  Stairs            Wheelchair Mobility    Modified Rankin (Stroke Patients Only) Modified Rankin (Stroke Patients Only) Pre-Morbid Rankin Score: No symptoms Modified Rankin: Moderately severe disability     Balance Overall balance assessment: Needs assistance Sitting-balance support: Feet supported Sitting balance-Leahy Scale: Fair   Postural control: Left lateral lean Standing balance support: Bilateral upper extremity supported;During functional activity Standing balance-Leahy Scale: Fair Standing balance comment: fair- for dynamic stand and poor+ for gait                             Pertinent Vitals/Pain Pain Assessment: Faces Faces Pain Scale: No hurt    Home Living Family/patient expects to be discharged to:: Private residence Living Arrangements: Children Available Help at Discharge: Family;Available PRN/intermittently Type of Home: House Home Access: Stairs to enter              Prior Function Level of Independence: Independent         Comments: babysitting 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 for son and daughter in law     Hand Dominance   Dominant Hand: Right    Extremity/Trunk Assessment   Upper Extremity Assessment Upper Extremity Assessment: Generalized weakness LUE Deficits /  Details: uses LUE on walker but is not controlling well    Lower Extremity Assessment Lower Extremity Assessment: Overall WFL for tasks assessed;LLE deficits/detail LLE Deficits / Details: coordination changes  with gait but not testing weakly LLE Coordination: decreased gross motor    Cervical / Trunk Assessment Cervical / Trunk Assessment: Normal  Communication   Communication: Prefers language other than [place];Other (comment)(not on Stratus I pad)  Cognition Arousal/Alertness: Awake/alert Behavior During Therapy: Flat affect;Impulsive Overall Cognitive Status: Impaired/Different from baseline Area of Impairment: Safety/judgement;Following commands                       Following Commands: Follows one step commands inconsistently;Follows one step commands with increased time Safety/Judgement: Decreased awareness of safety;Decreased awareness of deficits     General Comments: Pt impulsive and requiring cues to wait for PT prior to performing transfers.       General Comments General comments (skin integrity, edema, etc.): Pt is up to walk and reach the comb bedside, impulsive and did not get cued for this by son.  Pt is struggling with limitations but readily takes walker as cued    Exercises     Assessment/Plan    PT Assessment Patient needs continued PT services  PT Problem List Decreased activity tolerance;Decreased balance;Decreased coordination;Decreased cognition;Decreased safety awareness;Decreased knowledge of use of DME;Obesity       PT Treatment Interventions DME instruction;Gait training;Stair training;Functional mobility training;Therapeutic activities;Therapeutic exercise;Balance training;Neuromuscular re-education;Patient/family education    PT Goals (Current goals can be found in the Care Plan section)  Acute Rehab PT Goals Patient Stated Goal: none stated PT Goal Formulation: Patient unable to participate in goal setting Time For Goal Achievement: 03/16/19 Potential to Achieve Goals: Good    Frequency Min 4X/week   Barriers to discharge Inaccessible home environment;Decreased caregiver support home alone at times    Co-evaluation                AM-PAC PT "6 Clicks" Mobility  Outcome Measure Help needed turning from your back to your side while in a flat bed without using bedrails?: None Help needed moving from lying on your back to sitting on the side of a flat bed without using bedrails?: A Little Help needed moving to and from a bed to a chair (including a wheelchair)?: A Little Help needed standing up from a chair using your arms (e.g., wheelchair or bedside chair)?: A Little Help needed to walk in hospital room?: A Lot Help needed climbing 3-5 steps with a railing? 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 Number: [place] [facility] [place]

## 2019-03-02 NOTE — Progress Notes (Signed)
Ref: Dixie Dials, MD   Subjective:  Feeling better. Improved blood sugar control. Afebrile. Ambulation improving per nurse and son.  Serum rheumatoid factor is normal.  Objective:  Vital Signs in the last 24 hours: Temp:  [97.7 F (36.5 C)] 97.7 F (36.5 C) (08/04 2128) Pulse Rate:  [98-100] 100 (08/05 0925) Resp:  [18] 18 (08/04 2128) BP: (102-121)/(57-60) 121/60 (08/05 0925) SpO2:  [96 %] 96 % (08/04 2128)  Physical Exam: BP Readings from Last 1 Encounters:  03/02/19 121/60     Wt Readings from Last 1 Encounters:  02/22/19 82.4 kg    Weight change:  Body mass index is 32.18 kg/m. HEENT: [place]/AT, Eyes-Brown, PERL, EOMI, Conjunctiva-Pink, Sclera-Non-icteric Neck: No JVD, No bruit, Trachea midline. Lungs:  Clear, Bilateral. Cardiac:  Regular rhythm, normal S1 and S2, no S3. II/VI systolic murmur. Abdomen:  Soft, non-tender. BS present. Extremities:  No edema present. No cyanosis. No clubbing. CNS: AxOx3, Cranial nerves grossly intact, moves all 4 extremities.  Skin: Warm and dry.   Intake/Output from previous day: 08/04 0701 - 08/05 0700 In: 94.2 [I.V.:94.2] Out: -     Lab Results: BMET    Component Value Date/Time   NA 137 03/02/2019 0331   NA 137 02/25/2019 0438   NA 134 (L) 02/21/2019 2354   NA 139 11/30/2014 1001   K 4.0 03/02/2019 0331   K 3.9 02/25/2019 0438   K 3.8 02/21/2019 2354   CL 101 03/02/2019 0331   CL 105 02/25/2019 0438   CL 97 (L) 02/21/2019 2345   CO2 24 03/02/2019 0331   CO2 16 (L) 02/25/2019 0438   CO2 20 (L) 02/21/2019 2345   GLUCOSE 183 (H) 03/02/2019 0331   GLUCOSE 295 (H) 02/25/2019 0438   GLUCOSE 184 (H) 02/21/2019 2345   BUN 11 03/02/2019 0331   BUN 13 02/25/2019 0438   BUN 7 02/21/2019 2345   BUN 11 11/30/2014 1001   CREATININE 0.60 03/02/2019 0331   CREATININE 0.64 02/25/2019 0438   CREATININE 0.60 02/22/2019 0825   CALCIUM 9.1 03/02/2019 0331   CALCIUM 9.5 02/25/2019 0438   CALCIUM 9.4 02/21/2019 2345   GFRNONAA >60  03/02/2019 0331   GFRNONAA >60 02/25/2019 0438   GFRNONAA >60 02/22/2019 0825   GFRAA >60 03/02/2019 0331   GFRAA >60 02/25/2019 0438   GFRAA >60 02/22/2019 0825   CBC    Component Value Date/Time   WBC 5.7 03/02/2019 0331   RBC 3.96 03/02/2019 0331   HGB 10.9 (L) 03/02/2019 0331   HGB 12.5 11/30/2014 1001   HCT 34.1 (L) 03/02/2019 0331   HCT 36.2 02/22/2019 0825   PLT 345 03/02/2019 0331   PLT 369 11/30/2014 1001   MCV 86.1 03/02/2019 0331   MCV 83 11/30/2014 1001   MCH 27.5 03/02/2019 0331   MCHC 32.0 03/02/2019 0331   RDW 16.4 (H) 03/02/2019 0331   RDW 14.1 11/30/2014 1001   LYMPHSABS 0.7 03/02/2019 0331   LYMPHSABS 2.2 11/30/2014 1001   MONOABS 0.4 03/02/2019 0331   EOSABS 0.0 03/02/2019 0331   EOSABS 0.2 11/30/2014 1001   BASOSABS 0.0 03/02/2019 0331   BASOSABS 0.0 11/30/2014 1001   HEPATIC Function Panel Recent Labs    02/21/19 2345 02/25/19 0438 03/02/19 0331  PROT 8.8* 7.5 5.8*   HEMOGLOBIN A1C No components found for: HGA1C,  MPG CARDIAC ENZYMES Lab Results  Component Value Date   CKTOTAL 18 (L) 02/14/2019   CKMB 0.9 02/14/2019   BNP No results for input(s): PROBNP  in the last 8760 hours. TSH Recent Labs    02/13/19 0629 02/21/19 2327  TSH 0.367 1.696   CHOLESTEROL Recent Labs    02/16/19 0639 03/02/19 0331  CHOL 167 181    Scheduled Meds: . aspirin  325 mg Oral Daily  . atorvastatin  20 mg Oral q1800  . clopidogrel  75 mg Oral Daily  . docusate sodium  100 mg Oral BID  . famotidine  20 mg Oral QHS  . feeding supplement  1 Container Oral BID BM  . folic acid  2 mg Oral Daily  . heparin  5,000 Units Subcutaneous Q8H  . insulin aspart  0-20 Units Subcutaneous TID WC  . insulin aspart  0-5 Units Subcutaneous QHS  . insulin glargine  15 Units Subcutaneous QHS  . irbesartan  150 mg Oral Daily  . levothyroxine  75 mcg Oral Daily  . metoprolol succinate  50 mg Oral Daily  . multivitamin with minerals  1 tablet Oral Daily  .  pantoprazole  40 mg Oral Daily  . polyethylene glycol  17 g Oral Daily  . predniSONE  40 mg Oral QAC breakfast  . 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   LOS: 8 days   Time spent including chart review, lab review, examination, discussion with patient, family, PT/OT tech : 30 min   [name]  MD  03/02/2019, 5:24 PM

## 2019-03-02 NOTE — Progress Notes (Signed)
Subjective: Continues to improve  Exam: Vitals:   03/01/19 2128 03/02/19 0925  BP: (!) 102/57 121/60  Pulse: 98 100  Resp: 18   Temp: 97.7 F (36.5 C)   SpO2: 96%    Gen: In bed, NAD Resp: non-labored breathing, no acute distress Abd: soft, nt  Neuro: MS: Awake, alert, answers quickly, oriented to month and year CN: Visual fields full, extraocular movements intact Motor: Moves all extremities well Sensory: Intact light touch  Pertinent Labs: From 7/28:  C3, C4, anti-DNA, anti-Smith. ANA is positive with a 1-1 60 titer and homogeneous pattern ESR 42 CRP 2.3 Antiphospholipid antibody-pending  CSF from 7/22: WBC 425, 88% monocytes RBC 17 Protein 103 Glucose 46 Crypto antigen, CSF culture, HSV by PCR, fungal stain, AFB stain were all negative.   Impression: 58 year old female with a history of seronegative rheumatoid arthritis who recently weaned steroids and then presented with meningoencephalitis.  The WBC is very high for autoimmune meningoencephalitis, but given that the cultures were all negative and she seems to have responded well to steroids, I do think that autoimmune process is most likely.  I would favor continuing steroids at a lower dose.  Given that she seems to be much improved, almost back to baseline, I would not favor more aggressive immunotherapy at this time.  MRI reveals a small infarct in the right parietal white matter, slightly larger than previous, but I do not think responsible for any of her symptoms this hospitalization. No evidence for vasculitis on angiogram. She is already on dual antiplatelet therapy. Will also  need statin.   Recommendations: 1) prednisone 40 mg daily x 4 weeks then 20 mg daily with further titration per her rheumatologist 2) I have added an autoimmune panel to be sent to [facility] for her CSF, also added a CSF rheumatoid factor these are pending at this time.  4) continue dual antiplatelet therapy from a stroke perspective  until August 9, following this monotherapy is appropriate for stroke prevention, but dual antiplatelet therapy could be continued if needed from other medical problems. 5) LDL 99, goal < 70, have started lipitor.  6) Will need PT, OT, liekly rehab placement.   Roland Rack, MD [facility] [phone]  If 7pm- 7am, please page neurology on call as listed in [place].

## 2019-03-02 NOTE — Evaluation (Signed)
Occupational Therapy Evaluation Patient Details Name: Victoria Cox MRN: [id number] DOB: 20-Mar-1961 Today's Date: 03/02/2019    History of Present Illness 58 year old female with recent admission for altered mental status and possible viral encephalitis, recent lacunar infarct of right corona radiata, had behavior changes with severe combativeness and confusion, diffuse slowing on EEG; pt decided last minute to not pursue CIR. Additional CT 7/28 without acute change. MRI suggestive of diffuse CNS inflammatory process. Further workup in progress. Other PMH includes RA, DM2, HTN, obesity.   Clinical Impression   Pt with decline in function and safety with ADLs and ADL mobility with impaired strength, balance, coordination and endurance. Pt's son present during session. Pt's son reports that pt was independent with all selfcare, ADLs, mobility and home mgt PTA and that she was also babysitting the grandchildren. Pt now required assist with mobility using RW and with LB ADLs and ADLs in standing due to weakness and Poor balance. Pt would benefit from acute OT services to address impairments to maximize level of function and safety    Follow Up Recommendations  CIR;Supervision/Assistance - 24 hour    Equipment Recommendations  Other (comment)(TBD at next venue of care)    Recommendations for Other Services Rehab consult     Precautions / Restrictions Precautions Precautions: Fall Precaution Comments: family must translate Restrictions Weight Bearing Restrictions: No      Mobility Bed Mobility Overal bed mobility: Needs Assistance Bed Mobility: Supine to Sit;Sit to Supine     Supine to sit: Min assist Sit to supine: Min assist      Transfers Overall transfer level: Needs assistance Equipment used: Rolling walker (2 wheeled) Transfers: Sit to/from Stand Sit to Stand: Min guard;Min assist         General transfer comment: min assist to control balance at times, may be  impulsivity as much as coordination    Balance Overall balance assessment: Needs assistance Sitting-balance support: Feet supported Sitting balance-Leahy Scale: Fair     Standing balance support: Bilateral upper extremity supported;During functional activity;Single extremity supported Standing balance-Leahy Scale: Poor                             ADL either performed or assessed with clinical judgement   ADL Overall ADL's : Needs assistance/impaired     Grooming: Wash/dry hands;Wash/dry face;Minimal assistance;Standing   Upper Body Bathing: Min guard;Sitting   Lower Body Bathing: Moderate assistance;Sitting/lateral leans   Upper Body Dressing : Min guard;Sitting   Lower Body Dressing: Moderate assistance;Sitting/lateral leans;Maximal assistance;Sit to/from stand   Toilet Transfer: Minimal assistance;Ambulation;Regular Toilet;Grab bars;RW   Toileting- Clothing Manipulation and Hygiene: Moderate assistance       Functional mobility during ADLs: Moderate assistance;Rolling walker;Cueing for safety;Cueing for sequencing       Vision Patient Visual Report: No change from baseline       Perception     Praxis      Pertinent Vitals/Pain Pain Assessment: No/denies pain Faces Pain Scale: No hurt Pain Intervention(s): Monitored during session     Hand Dominance Right   Extremity/Trunk Assessment Upper Extremity Assessment Upper Extremity Assessment: Generalized weakness       Cervical / Trunk Assessment Cervical / Trunk Assessment: Normal   Communication Communication Communication: Prefers language other than English   Cognition Arousal/Alertness: Awake/alert Behavior During Therapy: Flat affect;Impulsive Overall Cognitive Status: Impaired/Different from baseline Area of Impairment: Safety/judgement;Following commands  Following Commands: Follows one step commands inconsistently;Follows one step commands with  increased time Safety/Judgement: Decreased awareness of safety;Decreased awareness of deficits     General Comments: Pt impulsive and requiring cues to wait for PT prior to performing transfers.    General Comments       Exercises     Shoulder Instructions      Home Living Family/patient expects to be discharged to:: Private residence Living Arrangements: Children Available Help at Discharge: Family;Available PRN/intermittently Type of Home: House Home Access: Stairs to enter     Home Layout: Two level;Able to live on main level with bedroom/bathroom     Bathroom Shower/Tub: Teacher, early years/pre: Standard     Home Equipment: Kasandra Knudsen - single point      Lives With: Family    Prior Functioning/Environment Level of Independence: Independent        Comments: babysitting 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 for son and daughter in law        OT Problem List: Decreased strength;Decreased activity tolerance;Impaired balance (sitting and/or standing);Decreased safety awareness;Decreased coordination;Decreased knowledge of use of DME or AE      OT Treatment/Interventions: Self-care/ADL training;Therapeutic exercise;Neuromuscular education;DME and/or AE instruction;Energy conservation;Therapeutic activities;Cognitive remediation/compensation;Patient/family education;Balance training    OT Goals(Current goals can be found in the care plan section) Acute Rehab OT Goals Patient Stated Goal: (pt's son would like rehab before pt returns home) OT Goal Formulation: (P) Patient unable to participate in goal setting(Pt's son present) Time For Goal Achievement: (P) 03/09/19 ADL Goals Pt Will Perform Grooming: with min guard assist;standing Pt Will Perform Upper Body Bathing: with supervision;with set-up;sitting Pt Will Perform Lower Body Bathing: with min assist;sitting/lateral leans;sit to/from stand Pt Will Perform Upper Body Dressing: with supervision;with set-up;sitting Pt Will Transfer  to Toilet: with min guard assist;ambulating;grab bars Pt Will Perform Toileting - Clothing Manipulation and hygiene: with min assist;sit to/from stand  OT Frequency: Min 2X/week   Barriers to D/C: Decreased caregiver support          Co-evaluation              AM-PAC OT "6 Clicks" Daily Activity     Outcome Measure Help from another person eating meals?: A Little Help from another person taking care of personal grooming?: A Little Help from another person toileting, which includes using toliet, bedpan, or urinal?: A Lot Help from another person bathing (including washing, rinsing, drying)?: A Lot Help from another person to put on and taking off regular upper body clothing?: A Little Help from another person to put on and taking off regular lower body clothing?: A Lot 6 Click Score: 15   End of Session Equipment Utilized During Treatment: Gait belt;Rolling walker  Activity Tolerance: Patient tolerated treatment well Patient left: with call bell/phone within reach;with family/visitor present;in bed  OT Visit Diagnosis: Unsteadiness on feet (R26.81);Other abnormalities of gait and mobility (R26.89);Muscle weakness (generalized) (M62.81);Other symptoms and signs involving cognitive function                Time: 7035-0093 OT Time Calculation (min): 31 min Charges:  OT General Charges $OT Visit: 1 Visit OT Evaluation $OT Eval Moderate Complexity: 1 Mod OT Treatments $Self Care/Home Management : 8-22 mins    Britt Bottom 03/02/2019, 2:29 PM

## 2019-03-02 NOTE — Progress Notes (Signed)
Tele monitoring order discontinued. Pt alert and oriented x4. Call light within reach. Family at bedside. VMT department notified. Will continue to monitor.

## 2019-03-02 NOTE — Progress Notes (Signed)
Inpatient Diabetes Program Recommendations  AACE/ADA: New Consensus Statement on Inpatient Glycemic Control (2015)  Target Ranges:  Prepandial:   less than 140 mg/dL      Peak postprandial:   less than 180 mg/dL (1-2 hours)      Critically ill patients:  140 - 180 mg/dL   Lab Results  Component Value Date   GLUCAP 281 (H) 03/02/2019   HGBA1C 7.2 (H) 02/16/2019    Review of Glycemic Control Results for Victoria Cox, Victoria Cox (MRN [id number]) as of 03/02/2019 13:08  Ref. Range 03/01/2019 11:54 03/01/2019 16:11 03/01/2019 21:31 03/02/2019 08:18 03/02/2019 12:54  Glucose-Capillary Latest Ref Range: 70 - 99 mg/dL 141 (H) 220 (H) 276 (H) 133 (H) 281 (H)  Diabetes history: DM 2 Outpatient Diabetes medications:  Metformin 1000 mg bid Novolog 70/30 mix 18 units bid Current orders for Inpatient glycemic control:  Novolog resistant tid with meals and HS Lantus 15 units q HS Prednisone 40 mg daily Inpatient Diabetes Program Recommendations:    Please consider adding Novolog meal coverage 3 units tid with meals.   Thanks  Adah Perl, RN, BC-ADM Inpatient Diabetes Coordinator Pager [phone] (8a-5p)

## 2019-03-02 NOTE — Progress Notes (Signed)
Rehab Admissions Coordinator Note:  Per PT recommendation, this patient was screened by Jhonnie Garner for appropriateness for an Inpatient Acute Rehab Consult.  At this time, we are recommending Inpatient Rehab consult. AC will contact MD to request order.   Jhonnie Garner 03/02/2019, 11:15 AM  I can be reached at [phone].

## 2019-03-02 NOTE — Progress Notes (Signed)
Inpatient Rehabilitation-Admissions Coordinator   [facility] familiar with pt from previous admission. Met with pt and her son at the bedside for rehabilitation assessment. Feel pt is a good candidate at this time and pt in agreement for CIR. However, upon review with PM&R medical team, they would like to monitor how she does on current steroid dosage and is not ready for CIR today.   Will follow up tomorrow.   Jhonnie Garner, OTR/L  Rehab Admissions Coordinator  [phone] 03/02/2019 1:56 PM

## 2019-03-03 LAB — RHEUMATOID FACTORS, FLUID: Rheumatoid Arthritis, Qn/Fluid: NEGATIVE

## 2019-03-03 LAB — GLUCOSE, CAPILLARY
Glucose-Capillary: 130 mg/dL — ABNORMAL HIGH (ref 70–99)
Glucose-Capillary: 242 mg/dL — ABNORMAL HIGH (ref 70–99)
Glucose-Capillary: 363 mg/dL — ABNORMAL HIGH (ref 70–99)
Glucose-Capillary: 259 mg/dL — ABNORMAL HIGH (ref 70–99)

## 2019-03-03 NOTE — Progress Notes (Signed)
Physical Therapy Treatment Patient Details Name: Victoria Cox MRN: [id number] DOB: 04-Jul-1961 Today's Date: 03/03/2019    History of Present Illness 58 year old female with recent admission for altered mental status and possible viral encephalitis, recent lacunar infarct of right corona radiata, had behavior changes with severe combativeness and confusion, diffuse slowing on EEG; pt decided last minute to not pursue CIR. Additional CT 7/28 without acute change. MRI suggestive of diffuse CNS inflammatory process. Further workup in progress. Other PMH includes RA, DM2, HTN, obesity.    PT Comments    Pt was seen for addition of friction massage to L knee on medial collateral based on pain complaints and translated symptoms.  Her son was in attendance, but pt also heard him on the phone a moment and told PT he was discussing her physical therapy.  Has some reasonable simple English but does require translation for full benefit.  Follow up with acute therapy as she awaits the final decision of where inpt therapy can be done.  Follow Up Recommendations  CIR;Supervision for mobility/OOB;Supervision/Assistance - 24 hour     Equipment Recommendations  None recommended by PT    Recommendations for Other Services Rehab consult     Precautions / Restrictions Precautions Precautions: Fall Precaution Comments: family must translate Restrictions Weight Bearing Restrictions: No    Mobility  Bed Mobility Overal bed mobility: Modified Independent Bed Mobility: Supine to Sit              Transfers Overall transfer level: Needs assistance Equipment used: Rolling walker (2 wheeled);1 person hand held assist Transfers: Sit to/from Stand Sit to Stand: Min assist;Supervision         General transfer comment: min assist from floor level bed and supervision from higher surfaces  Ambulation/Gait Ambulation/Gait assistance: Min guard;Min assist   Assistive device: 1 person hand held  assist Gait Pattern/deviations: Step-through pattern;Decreased stride length;Wide base of support Gait velocity: reduced   General Gait Details: pt did not demonstrate any scissoring or outright LOB on LLE, but does note knee pain with mild buckling   Stairs             Wheelchair Mobility    Modified Rankin (Stroke Patients Only)       Balance   Sitting-balance support: Feet supported Sitting balance-Leahy Scale: Good     Standing balance support: Bilateral upper extremity supported;During functional activity;Single extremity supported Standing balance-Leahy Scale: Fair Standing balance comment: less than fair for dynamic standing                            Cognition Arousal/Alertness: Awake/alert Behavior During Therapy: Flat affect Overall Cognitive Status: Within Functional Limits for tasks assessed                                 General Comments: pt was more alert to PT cues for movement, very interested in her therapy plans      Exercises      General Comments General comments (skin integrity, edema, etc.): Pt was able to avoid scissoring today and increased WB stance time LLE with friction to medial collateral lig      Pertinent Vitals/Pain Pain Assessment: Faces Faces Pain Scale: Hurts even more Pain Location: L knee on medial collateral ligament Pain Descriptors / Indicators: Grimacing Pain Intervention(s): Limited activity within patient's tolerance;Monitored during session;Repositioned;Other (comment)(friction massage to L medial collateral  ligament)    Home Living                      Prior Function            PT Goals (current goals can now be found in the care plan section) Acute Rehab PT Goals Patient Stated Goal: none stated Progress towards PT goals: Progressing toward goals    Frequency    Min 4X/week      PT Plan Current plan remains appropriate    Co-evaluation               AM-PAC PT "6 Clicks" Mobility   Outcome Measure  Help needed turning from your back to your side while in a flat bed without using bedrails?: None Help needed moving from lying on your back to sitting on the side of a flat bed without using bedrails?: A Little Help needed moving to and from a bed to a chair (including a wheelchair)?: A Little Help needed standing up from a chair using your arms (e.g., wheelchair or bedside chair)?: A Little Help needed to walk in hospital room?: A Little Help needed climbing 3-5 steps with a railing? : A Lot 6 Click Score: 18    End of Session Equipment Utilized During Treatment: Gait belt Activity Tolerance: Patient limited by fatigue;Patient limited by pain Patient left: in chair;with call bell/phone within reach;with family/visitor present Nurse Communication: Mobility status;Precautions PT Visit Diagnosis: Hemiplegia and hemiparesis;Other abnormalities of gait and mobility (R26.89);Difficulty in walking, not elsewhere classified (R26.2) Hemiplegia - Right/Left: Left Hemiplegia - dominant/non-dominant: Non-dominant Hemiplegia - caused by: Cerebral infarction     Time: 1205-1237 PT Time Calculation (min) (ACUTE ONLY): 32 min  Charges:  $Gait Training: 8-22 mins $Therapeutic Exercise: 8-22 mins                    Ramond Dial 03/03/2019, 7:56 PM   Mee Hives, PT MS Acute Rehab Dept. Number: [place] [facility] [place]

## 2019-03-03 NOTE — Progress Notes (Addendum)
Nutrition Follow-up  DOCUMENTATION CODES:   Obesity unspecified  INTERVENTION:   -Continue Magic cup TID with meals, each supplement provides 290 kcal and 9 grams of protein -Continue Boost Breeze po BID, each supplement provides 250 kcal and 9 grams of protein -Continue MVI with minerals daily  NUTRITION DIAGNOSIS:   Predicted suboptimal nutrient intake related to lethargy/confusion as evidenced by per patient/family report.  Ongoing  GOAL:   Patient will meet greater than or equal to 90% of their needs  Progressing   MONITOR:   PO intake, Supplement acceptance, Labs, Weight trends, Skin, I & O's  REASON FOR ASSESSMENT:   Malnutrition Screening Tool    ASSESSMENT:   58 year old female with recent admission for altered mental status and possible viral encephalitis, recent lacunar infarct of right corona radiata, had behavior changes with severe combativeness and confusion. She has Rheumatoid arthritis, type 2 DM, hypertension, obesity and hypothyroidism, She was gradually tapered off of steroids over 3 months when she was placed on Humira injections q 2 weeks. Post Haldol and lorazepam she is sedated and sleepy but periodically extends arms to catch something so she can reposition her body. Prior to discharge home today after 9 days in hospital and work up by neurology and infectious disease she had ambulated well, fed herself but had some confusion at night time during hospital stay. She was scheduled to go to inpatient rehab for weakness and had changed her mind over weekend to go home.  Reviewed I/O's: +480 ml x 24 hours and +5 L since admission  Per MD notes, pt's mental status continues to improve. Per MD notes, plan to continue to monitor on current steroid dosages prior to discharge.   Pt continues with good appetite. Noted meal completion 50-100%. Pt is accepting Boost Breeze supplements. Pt is able to self-feed, however, pt does not care for hospital food.   Family  still hopeful for CIR at discharge.   Medications reviewed and include prednisone, miralax, protonix, folic acid, and colace.   Labs reviewed: CBGS: 133-327 (inpatient orders for glycemic control ar e0-20 units insulin aspart TID with meals, 0-5 units insulin aspart q HS, and 15 units insulin glargine q HS).   Diet Order:   Diet Order            Diet vegetarian Room service appropriate? Yes; Fluid consistency: Thin  Diet effective now              EDUCATION NEEDS:   No education needs have been identified at this time  Skin:  Skin Integrity Issues:: Incisions, Other (Comment) Incisions: closed rt groin Other: open lt arm wound  Last BM:  03/02/19  Height:   Ht Readings from Last 1 Encounters:  02/22/19 5\' 3"  (1.6 m)    Weight:   Wt Readings from Last 1 Encounters:  02/22/19 82.4 kg    Ideal Body Weight:  52.3 kg  BMI:  Body mass index is 32.18 kg/m.  Estimated Nutritional Needs:   Kcal:  1600-1800  Protein:  80-95 grams  Fluid:  1.6-1.8 L    Seraphim Affinito A. Jimmye Norman, RD, LDN, [place] Registered Dietitian II Certified Diabetes Care and Education Specialist Pager: [phone] After hours Pager: [phone]

## 2019-03-03 NOTE — Progress Notes (Signed)
Inpatient Rehabilitation-Admissions Coordinator   Met with pt and her son at the bedside to discuss rehab options. Son reports pt doing much better today and that she has been up walking around the room. AC ambulated pt in room with Min G and no AD.  Pt's preference is to return home. AC feels this is appropriate. Discussion with admitting PM&R MD who does not feel CIR is appropriate at this time.   Pt and son in favor of home health services.   AC has contacted CM regarding preferences.   Jhonnie Garner, OTR/L  Rehab Admissions Coordinator  [phone] 03/03/2019 1:21 PM

## 2019-03-03 NOTE — Plan of Care (Signed)
  Problem: Clinical Measurements: Goal: Ability to maintain clinical measurements within normal limits will improve Outcome: Progressing Goal: Will remain free from infection Outcome: Progressing Goal: Diagnostic test results will improve Outcome: Progressing   Problem: Elimination: Goal: Will not experience complications related to bowel motility Outcome: Progressing

## 2019-03-04 LAB — GLUCOSE, CAPILLARY
Glucose-Capillary: 95 mg/dL (ref 70–99)
Glucose-Capillary: 246 mg/dL — ABNORMAL HIGH (ref 70–99)

## 2019-03-04 MED ORDER — NOVOLOG MIX 70/30 (70-30) 100 UNIT/ML ~~LOC~~ SUSP: 15.0000 [IU] | Freq: Two times a day (BID) | SUBCUTANEOUS | 2 refills | Status: AC

## 2019-03-04 MED ORDER — PREDNISONE 10 MG PO TABS: 40.0000 mg | ORAL_TABLET | Freq: Every day | ORAL | 1 refills | Status: AC

## 2019-03-04 MED ORDER — ATORVASTATIN CALCIUM 20 MG PO TABS: 20.0000 mg | ORAL_TABLET | Freq: Every day | ORAL | 3 refills | Status: AC

## 2019-03-04 MED ORDER — QUETIAPINE FUMARATE 50 MG PO TABS: 50.0000 mg | ORAL_TABLET | Freq: Two times a day (BID) | ORAL | 2 refills | Status: AC

## 2019-03-04 MED ORDER — FAMOTIDINE 20 MG PO TABS: 20.0000 mg | ORAL_TABLET | Freq: Every day | ORAL | Status: AC

## 2019-03-04 MED ORDER — PANTOPRAZOLE SODIUM 40 MG PO TBEC: 40.0000 mg | DELAYED_RELEASE_TABLET | Freq: Every day | ORAL | 1 refills | Status: AC

## 2019-03-04 MED ORDER — GUAIFENESIN-DM 100-10 MG/5ML PO SYRP: 5.0000 mL | ORAL_SOLUTION | ORAL | 0 refills | Status: AC | PRN

## 2019-03-04 NOTE — Discharge Summary (Signed)
Physician Discharge Summary  Patient ID: Victoria Cox MRN: [id number] DOB/AGE: 59-May-1962 59 y.o.  Admit date: 02/21/2019 Discharge date: 03/04/2019  Admission Diagnoses: Altered mental status Generalized weakness Rheumatoid arthritis Recent stroke Recent metabolic encephalopathy Hypertension Type 2 DM Obesity Hypothyroidism  Discharge Diagnoses:  Principal Problem: Meningo-encephalitis, autoimmune Active problem:    Altered mental status, improving   Possible viral encephalitis   Acute right corona radiata infarct    Type 2 DM   Rheumatoid arthritis, stable   Possible Humira adverse effect   Hypertension   Obesity   Hypothyroidism   Major depression with manic episode, imrpoving  Discharged Condition: fair  Hospital Course: 58 year old female with several weeks of myalgias, headache, mouth sores and generalized weakness coinciding with Humira injections and gradual tapering of prednisone had acute altered mental status with agitation in hours after discharge home needing readmission.  Neurology and Psychiatric consultations were obtained. By phone Rheumatology consultation was done. Patient improved with PO Seroquel and IV high dose methyl prednisone x 5 days followed by PO prednisone. Her Insulin dose was adjusted for prednisone use. Her ambulation and activities of daily living improved over next 1 week. IP rehabilitation was not deemed necessary and out patient Nurse and PT with nurses aid was arranged.  She was off Humira and continued PO 20 mg. Methotrexate once a week.  Her inflammatory panels were positive, ANA was weakly positive, DS DNA was negative and Rheumatoid factor after high dose prednisone was negative.  Her Herpes Simplex, CMV, Varicella Zoster tests were negative on CSF on previous hospitalization. She had elevated protein and WBC count on CSF examination with elevated opening pressure on LP of 44 cm. On that admission. She was discharged home in stable  condition with follow up in 1 week by me and 2-3 weeks by Neurology here and Rheumatologist in [place].  Consults: cardiology, neurology and psychiatry  Significant Diagnostic Studies: labs: Near normal CBC, BMET and Lipid panel. Blood sugar levels were elevated at times.  Rheumatoid factor was low.  ANA was positive at 1:160 homogenous with negative DS DNA, Anti-Smith and Anticardiolipin tests. Elevated ESR and C-reactive protein. Normal C3 and C4 levels.  EKG: Sinus tachycardia  Chest X-ray: Unremarkable.  Repeat MR brain on 03/01/2019 showed same acute right corona radiata infarct with mild progression to 1 cm.  Treatments: Cardiac meds: metoprolol, aspirin, Plavix, Telmisartan and Atorvastatin. Rheumatology Medications: Methotrexate 20 mg. once a week, Folic acid 2 mg. daily and prednisone dose of 40 mg. daily x 1 week and 30 mg. daily x 1 week and then continue 20 mg. daily till seen by Rheumatologist. She will probably continue 10 mg. daily dose for long time.  Discharge Exam: Blood pressure 119/67, pulse 97, temperature 98.5 F (36.9 C), temperature source Oral, resp. rate 18, height _0  (1.6 m), weight 82.4 kg, SpO2 96 %. General appearance: alert, cooperative and appears stated age. Head: Normocephalic, atraumatic. Eyes: Brown eyes, pink conjunctiva, corneas clear. PERRL, EOM's intact.  Neck: No adenopathy, no carotid bruit, no JVD, supple, symmetrical, trachea midline and thyroid not enlarged. Resp: Clear to auscultation bilaterally. Cardio: Regular rate and rhythm, S1, S2 normal, II/VI systolic murmur, no click, rub or gallop. GI: Soft, non-tender; bowel sounds normal; no organomegaly. Extremities: No edema, cyanosis or clubbing. Skin: Warm and dry.  Neurologic: Alert and oriented X 3, normal strength and tone. Normal coordination and slow gait with walker support.  Disposition: Discharge disposition: 01-Home or Self Care  Discharge Instructions     Ambulatory referral to Neurology   Complete by: As directed    An appointment is requested in approximately: 2 weeks     Allergies as of 03/04/2019   No Known Allergies     Medication List    STOP taking these medications   amLODipine 5 MG tablet Commonly known as: NORVASC   metFORMIN 1000 MG tablet Commonly known as: GLUCOPHAGE   omeprazole 20 MG capsule Commonly known as: PRILOSEC Replaced by: pantoprazole 40 MG tablet   potassium chloride 10 MEQ tablet Commonly known as: K-DUR     TAKE these medications   acetaminophen 500 MG tablet Commonly known as: TYLENOL Take 500 mg by mouth as needed for moderate pain or headache.   aspirin 325 MG tablet Take 1 tablet (325 mg total) by mouth daily.   atorvastatin 20 MG tablet Commonly known as: LIPITOR Take 1 tablet (20 mg total) by mouth daily at 6 PM.   clopidogrel 75 MG tablet Commonly known as: PLAVIX Take 1 tablet (75 mg total) by mouth daily.   docusate sodium 100 MG capsule Commonly known as: COLACE Take 1 capsule (100 mg total) by mouth 2 (two) times daily.   famotidine 20 MG tablet Commonly known as: PEPCID Take 1 tablet (20 mg total) by mouth at bedtime. What changed: when to take this   folic acid 1 MG tablet Commonly known as: FOLVITE Take 2 mg by mouth daily.   freestyle lancets Use to test blood sugar twice daily. Dx: E11.9   glucose blood test strip Commonly known as: FREESTYLE LITE Use to test blood sugar twice daily Dx: E11.9   guaiFENesin-dextromethorphan 100-10 MG/5ML syrup Commonly known as: ROBITUSSIN DM Take 5 mLs by mouth every 4 (four) hours as needed for cough.   levothyroxine 75 MCG tablet Commonly known as: SYNTHROID Take 75 mcg by mouth daily.   meclizine 25 MG tablet Commonly known as: ANTIVERT Take 25 mg by mouth 3 (three) times daily as needed for dizziness.   methotrexate 2.5 MG tablet Take 8 tablets (20 mg total) by mouth every 7 (seven) days.   metoprolol succinate  50 MG 24 hr tablet Commonly known as: TOPROL-XL Take 50 mg by mouth daily.   multivitamin with minerals Tabs tablet Take 1 tablet by mouth daily.   NovoLOG Mix 70/30 (70-30) 100 UNIT/ML injection Generic drug: insulin aspart protamine- aspart Inject 0.15 mLs (15 Units total) into the skin 2 (two) times daily with a meal. What changed:   how much to take  when to take this   pantoprazole 40 MG tablet Commonly known as: PROTONIX Take 1 tablet (40 mg total) by mouth daily. Start taking on: March 05, 2019 Replaces: omeprazole 20 MG capsule   polyethylene glycol 17 g packet Commonly known as: MIRALAX / GLYCOLAX Take 17 g by mouth daily.   predniSONE 10 MG tablet Commonly known as: DELTASONE Take 4 tablets (40 mg total) by mouth daily before breakfast. After 1 week decrease dose to 30 mg. daily and 1 week after 20 mg. daily. Start taking on: March 05, 2019   QUEtiapine 50 MG tablet Commonly known as: SEROQUEL Take 1 tablet (50 mg total) by mouth 2 (two) times daily.   telmisartan 20 MG tablet Commonly known as: MICARDIS Take 20 mg by mouth daily.      Follow-up Information    [facility] Follow up.   Specialty: Home Health Services Why: [place] to continue.  Contact information: [address] [place] [address] [phone]        Dixie Dials, MD. Call in 1 week(s).   Specialty: Cardiology Contact information: [address] [place] [address] [phone]        Starr Lake, MD Follow up.   Specialty: Rheumatology Why: as arranged by family Contact information: [place] [address] [phone]           Time spent: Review of old chart, current chart, lab, x-ray, cardiac tests and discussion with patient over 60 minutes.  Signed: Birdie Riddle 03/04/2019, 10:36 AM

## 2019-03-04 NOTE — Progress Notes (Signed)
Late Entry Ref: [name], MD   Subjective:  Awake. Awaiting possible rehab v/s home. Ambulation has improved.  Objective:  Vital Signs in the last 24 hours: Temp:  [97.7 F (36.5 C)-98.5 F (36.9 C)] 98.5 F (36.9 C) (08/06 2043) Pulse Rate:  [97-109] 97 (08/06 2043) Resp:  [18-20] 18 (08/06 2043) BP: (119-120)/(67) 119/67 (08/06 2043) SpO2:  [96 %-97 %] 96 % (08/06 2043)  Physical Exam: BP Readings from Last 1 Encounters:  03/03/19 119/67     Wt Readings from Last 1 Encounters:  02/22/19 82.4 kg    Weight change:  Body mass index is 32.18 kg/m. HEENT: [place]/AT, Eyes-Brown, PERL, EOMI, Conjunctiva-Pink, Sclera-Non-icteric Neck: No JVD, No bruit, Trachea midline. Lungs:  Clear, Bilateral. Cardiac:  Regular rhythm, normal S1 and S2, no S3. II/VI systolic murmur. Abdomen:  Soft, non-tender. BS present. Extremities:  No edema present. No cyanosis. No clubbing. CNS: AxOx3, Cranial nerves grossly intact, moves all 4 extremities.  Skin: Warm and dry.   Intake/Output from previous day: 08/06 0701 - 08/07 0700 In: 360 [P.O.:360] Out: -     Lab Results: BMET    Component Value Date/Time   NA 137 03/02/2019 0331   NA 137 02/25/2019 0438   NA 134 (L) 02/21/2019 2354   NA 139 11/30/2014 1001   K 4.0 03/02/2019 0331   K 3.9 02/25/2019 0438   K 3.8 02/21/2019 2354   CL 101 03/02/2019 0331   CL 105 02/25/2019 0438   CL 97 (L) 02/21/2019 2345   CO2 24 03/02/2019 0331   CO2 16 (L) 02/25/2019 0438   CO2 20 (L) 02/21/2019 2345   GLUCOSE 183 (H) 03/02/2019 0331   GLUCOSE 295 (H) 02/25/2019 0438   GLUCOSE 184 (H) 02/21/2019 2345   BUN 11 03/02/2019 0331   BUN 13 02/25/2019 0438   BUN 7 02/21/2019 2345   BUN 11 11/30/2014 1001   CREATININE 0.60 03/02/2019 0331   CREATININE 0.64 02/25/2019 0438   CREATININE 0.60 02/22/2019 0825   CALCIUM 9.1 03/02/2019 0331   CALCIUM 9.5 02/25/2019 0438   CALCIUM 9.4 02/21/2019 2345   GFRNONAA >60 03/02/2019 0331   GFRNONAA >60  02/25/2019 0438   GFRNONAA >60 02/22/2019 0825   GFRAA >60 03/02/2019 0331   GFRAA >60 02/25/2019 0438   GFRAA >60 02/22/2019 0825   CBC    Component Value Date/Time   WBC 5.7 03/02/2019 0331   RBC 3.96 03/02/2019 0331   HGB 10.9 (L) 03/02/2019 0331   HGB 12.5 11/30/2014 1001   HCT 34.1 (L) 03/02/2019 0331   HCT 36.2 02/22/2019 0825   PLT 345 03/02/2019 0331   PLT 369 11/30/2014 1001   MCV 86.1 03/02/2019 0331   MCV 83 11/30/2014 1001   MCH 27.5 03/02/2019 0331   MCHC 32.0 03/02/2019 0331   RDW 16.4 (H) 03/02/2019 0331   RDW 14.1 11/30/2014 1001   LYMPHSABS 0.7 03/02/2019 0331   LYMPHSABS 2.2 11/30/2014 1001   MONOABS 0.4 03/02/2019 0331   EOSABS 0.0 03/02/2019 0331   EOSABS 0.2 11/30/2014 1001   BASOSABS 0.0 03/02/2019 0331   BASOSABS 0.0 11/30/2014 1001   HEPATIC Function Panel Recent Labs    02/21/19 2345 02/25/19 0438 03/02/19 0331  PROT 8.8* 7.5 5.8*   HEMOGLOBIN A1C No components found for: HGA1C,  MPG CARDIAC ENZYMES Lab Results  Component Value Date   CKTOTAL 18 (L) 02/14/2019   CKMB 0.9 02/14/2019   BNP No results for input(s): PROBNP in the last 8760  hours. TSH Recent Labs    02/13/19 0629 02/21/19 2327  TSH 0.367 1.696   CHOLESTEROL Recent Labs    02/16/19 0639 03/02/19 0331  CHOL 167 181    Scheduled Meds: . aspirin  325 mg Oral Daily  . atorvastatin  20 mg Oral q1800  . clopidogrel  75 mg Oral Daily  . docusate sodium  100 mg Oral BID  . famotidine  20 mg Oral QHS  . feeding supplement  1 Container Oral BID BM  . folic acid  2 mg Oral Daily  . heparin  5,000 Units Subcutaneous Q8H  . insulin aspart  0-20 Units Subcutaneous TID WC  . insulin aspart  0-5 Units Subcutaneous QHS  . insulin glargine  15 Units Subcutaneous QHS  . irbesartan  150 mg Oral Daily  . levothyroxine  75 mcg Oral Daily  . metoprolol succinate  50 mg Oral Daily  . multivitamin with minerals  1 tablet Oral Daily  . pantoprazole  40 mg Oral Daily  .  polyethylene glycol  17 g Oral Daily  . predniSONE  40 mg Oral QAC breakfast  . QUEtiapine  50 mg Oral BID   Continuous Infusions: PRN Meds:.acetaminophen, guaiFENesin-dextromethorphan, LORazepam, meclizine  Assessment/Plan: Meningo-encephalitis, probably autoimmune Possible viral encephalitis, less likely Acute right corona radiata infarct Type 2 DM Rheumatoid arthritis Possible Humira adverse effect Hypertension Obesity Hypothyroidism Major depression with manic episode  IP rehab v/s Home.   LOS: 10 days   Time spent including chart review, lab review, examination, discussion with patient :  min   Dixie Dials  MD  03/04/2019, 9:35 AM

## 2019-03-04 NOTE — Progress Notes (Signed)
PT Cancellation Note  Patient Details Name: Victoria Cox MRN: [id number] DOB: 1961/04/20   Cancelled Treatment:    Reason Eval/Treat Not Completed: Other (comment).  Pt refused therapy today, no clear reason given through interpretation.  Follow up as time and pt allow.   Ramond Dial 03/04/2019, 11:26 AM   Mee Hives, PT MS Acute Rehab Dept. Number: [place] [facility] [place]

## 2019-03-04 NOTE — Progress Notes (Signed)
Nsg Discharge Note  Admit Date:  02/21/2019 Discharge date: 03/04/2019   Trinka Keshishyan to be D/C'd Home   per MD order.  AVS completed.  Copy for chart, and copy for patient signed, and dated. Patient/caregiver able to verbalize understanding.  Discharge Medication: Allergies as of 03/04/2019   No Known Allergies     Medication List    STOP taking these medications   amLODipine 5 MG tablet Commonly known as: NORVASC   metFORMIN 1000 MG tablet Commonly known as: GLUCOPHAGE   omeprazole 20 MG capsule Commonly known as: PRILOSEC Replaced by: pantoprazole 40 MG tablet   potassium chloride 10 MEQ tablet Commonly known as: K-DUR     TAKE these medications   acetaminophen 500 MG tablet Commonly known as: TYLENOL Take 500 mg by mouth as needed for moderate pain or headache.   aspirin 325 MG tablet Take 1 tablet (325 mg total) by mouth daily.   atorvastatin 20 MG tablet Commonly known as: LIPITOR Take 1 tablet (20 mg total) by mouth daily at 6 PM.   clopidogrel 75 MG tablet Commonly known as: PLAVIX Take 1 tablet (75 mg total) by mouth daily.   docusate sodium 100 MG capsule Commonly known as: COLACE Take 1 capsule (100 mg total) by mouth 2 (two) times daily.   famotidine 20 MG tablet Commonly known as: PEPCID Take 1 tablet (20 mg total) by mouth at bedtime. What changed: when to take this   folic acid 1 MG tablet Commonly known as: FOLVITE Take 2 mg by mouth daily.   freestyle lancets Use to test blood sugar twice daily. Dx: E11.9   glucose blood test strip Commonly known as: FREESTYLE LITE Use to test blood sugar twice daily Dx: E11.9   guaiFENesin-dextromethorphan 100-10 MG/5ML syrup Commonly known as: ROBITUSSIN DM Take 5 mLs by mouth every 4 (four) hours as needed for cough.   levothyroxine 75 MCG tablet Commonly known as: SYNTHROID Take 75 mcg by mouth daily.   meclizine 25 MG tablet Commonly known as: ANTIVERT Take 25 mg by mouth 3 (three) times  daily as needed for dizziness.   methotrexate 2.5 MG tablet Take 8 tablets (20 mg total) by mouth every 7 (seven) days.   metoprolol succinate 50 MG 24 hr tablet Commonly known as: TOPROL-XL Take 50 mg by mouth daily.   multivitamin with minerals Tabs tablet Take 1 tablet by mouth daily.   NovoLOG Mix 70/30 (70-30) 100 UNIT/ML injection Generic drug: insulin aspart protamine- aspart Inject 0.15 mLs (15 Units total) into the skin 2 (two) times daily with a meal. What changed:   how much to take  when to take this   pantoprazole 40 MG tablet Commonly known as: PROTONIX Take 1 tablet (40 mg total) by mouth daily. Start taking on: March 05, 2019 Replaces: omeprazole 20 MG capsule   polyethylene glycol 17 g packet Commonly known as: MIRALAX / GLYCOLAX Take 17 g by mouth daily.   predniSONE 10 MG tablet Commonly known as: DELTASONE Take 4 tablets (40 mg total) by mouth daily before breakfast. After 1 week decrease dose to 30 mg. daily and 1 week after 20 mg. daily. Start taking on: March 05, 2019   QUEtiapine 50 MG tablet Commonly known as: SEROQUEL Take 1 tablet (50 mg total) by mouth 2 (two) times daily.   telmisartan 20 MG tablet Commonly known as: MICARDIS Take 20 mg by mouth daily.       Discharge Assessment: Vitals:   03/03/19 1441  03/03/19 2043  BP: 120/67 119/67  Pulse: (!) 109 97  Resp: 20 18  Temp: 97.7 F (36.5 C) 98.5 F (36.9 C)  SpO2: 97% 96%   Skin clean, dry and intact without evidence of skin break down, no evidence of skin tears noted. IV catheter discontinued intact. Site without signs and symptoms of complications - no redness or edema noted at insertion site, patient denies c/o pain - only slight tenderness at site.  Dressing with slight pressure applied.  D/c Instructions-Education: Discharge instructions given to patient/family with verbalized understanding. D/c education completed with patient/family including follow up instructions,  medication list, d/c activities limitations if indicated, with other d/c instructions as indicated by MD - patient able to verbalize understanding, all questions fully answered. Patient instructed to return to ED, call 911, or call MD for any changes in condition.  Patient escorted via [place], and D/C home via private auto.  Erasmo Leventhal, RN 03/04/2019 11:28 AM

## 2019-03-09 LAB — MISC LABCORP TEST (SEND OUT): Labcorp test code: 9985

## 2019-03-09 LAB — CULTURE, FUNGUS WITHOUT SMEAR

## 2019-03-14 ENCOUNTER — Telehealth (HOSPITAL_COMMUNITY): Payer: Self-pay

## 2019-03-14 NOTE — Telephone Encounter (Signed)
Called to schedule consult, no answer, left daughter a message. AW

## 2019-03-29 LAB — ARBOVIRUS PANEL, ~~LOC~~ LAB

## 2019-04-01 LAB — ACID FAST CULTURE WITH REFLEXED SENSITIVITIES (MYCOBACTERIA): Acid Fast Culture: NEGATIVE

## 2019-04-19 ENCOUNTER — Telehealth (HOSPITAL_COMMUNITY): Payer: Self-pay

## 2019-04-19 NOTE — Telephone Encounter (Signed)
Called to schedule stroke f/u, no answer, left vm. AW  

## 2019-05-09 ENCOUNTER — Telehealth (HOSPITAL_COMMUNITY): Payer: Self-pay

## 2019-05-09 NOTE — Telephone Encounter (Signed)
Spoke with daughter to schedule f/u. Daughter will speak with Dr. Doylene Canard first and then call back if they decide to schedule. AW

## 2020-06-06 IMAGING — DX PORTABLE CHEST - 1 VIEW
1 series · 1 of 1 positions shown · non-contrast
Comparison: 02/18/2019, 02/16/2019

CLINICAL DATA: Altered mental status

EXAM:
PORTABLE CHEST 1 VIEW

[chest ap]
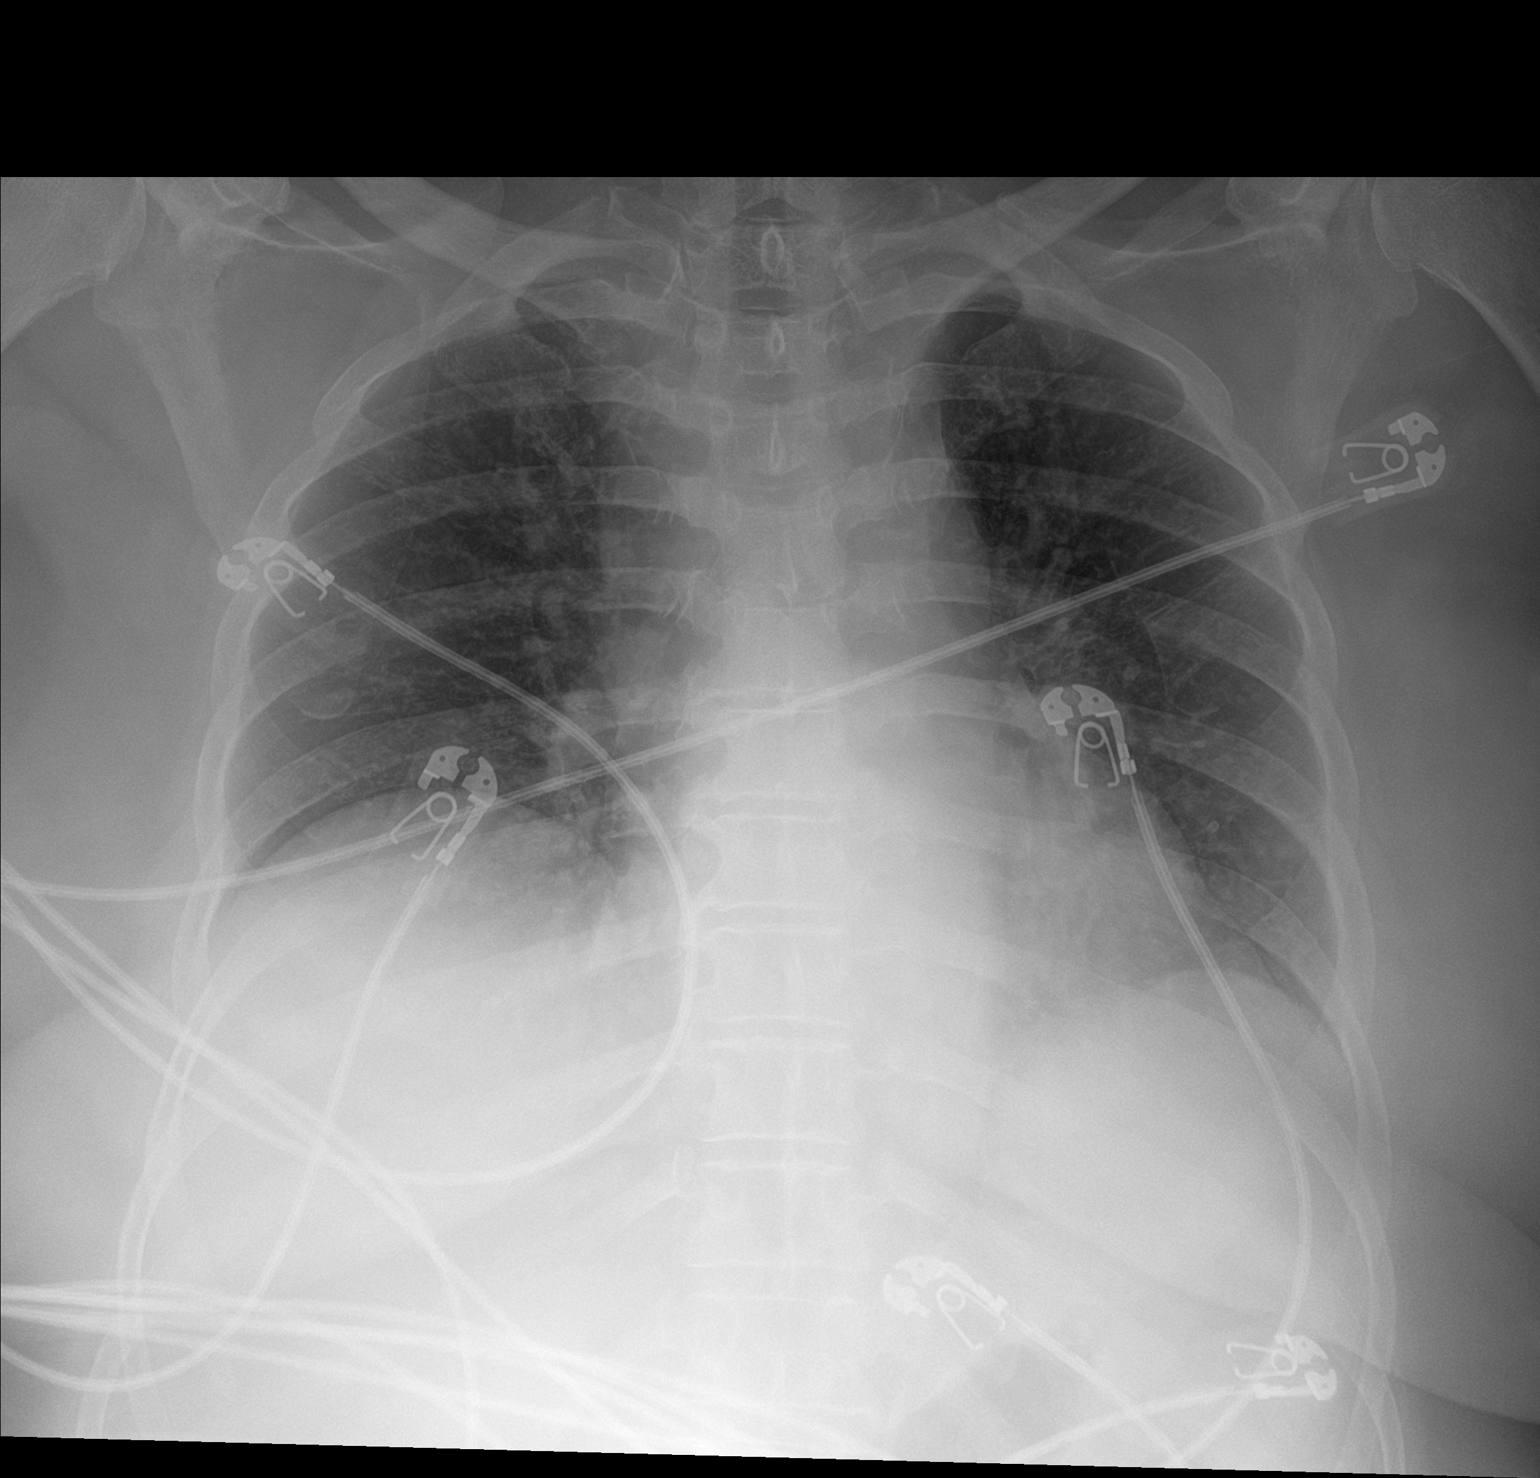

[1 of 1 positions shown; findings below may reference images not displayed]

FINDINGS: Low lung volumes. No focal airspace disease or effusion. Normal
heart size. No pneumothorax.
IMPRESSION: No active disease.

## 2020-06-13 IMAGING — MR MRI HEAD WITHOUT CONTRAST
12 of 14 series · 43 of 48 positions shown · non-contrast
Comparison: CT head 02/22/2019.  MRI 02/16/2019 negative

CLINICAL DATA: Headache, new, meningitis or encephalitis is
suspected.

EXAM:
MRI HEAD WITHOUT CONTRAST
TECHNIQUE: Multiplanar, multiecho pulse sequences of the brain and surrounding
structures were obtained without intravenous contrast.

[Series 5: DWI · axial · 3.0mm · 0.88mm/px · z∈[-71,+76]mm · 7 of 100 slices shown (1 of 4)]
[im 1/100]
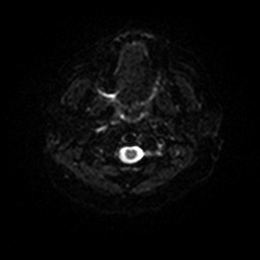
[im 17/100]
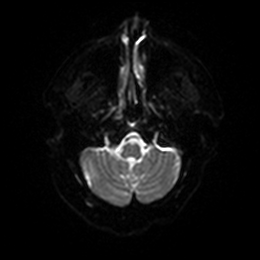
[im 34/100]
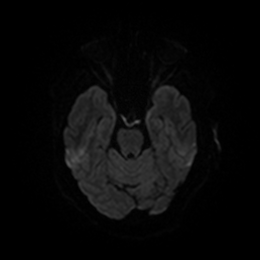
[im 50/100]
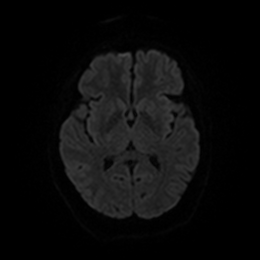
[im 67/100]
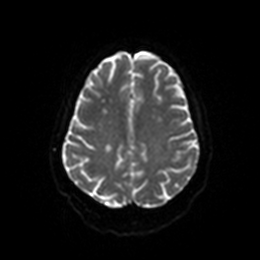
[im 83/100]
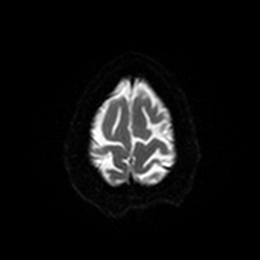
[im 100/100]
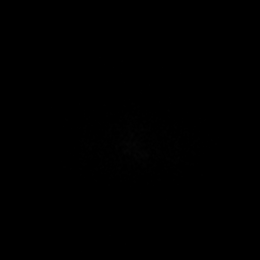

[Series 6: DWI · axial · 3.0mm · 0.88mm/px · z∈[-71,+73]mm · 3 of 48 slices shown (2 of 4)]
[im 1/48]
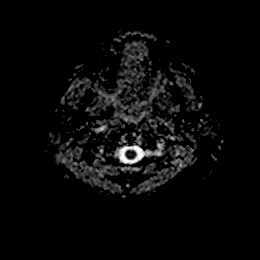
[im 24/48]
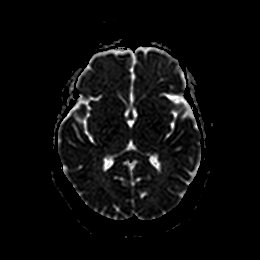
[im 48/48]
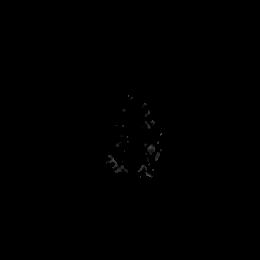

[Series 7: DWI · coronal · 4.0mm · 0.88mm/px · 5 of 64 slices shown (3 of 4)]
[im 1/64]
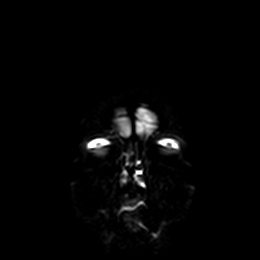
[im 16/64]
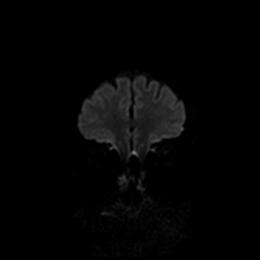
[im 32/64]
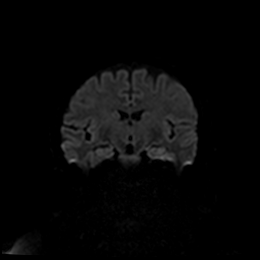
[im 48/64]
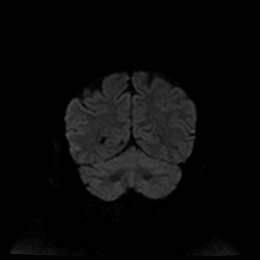
[im 64/64]
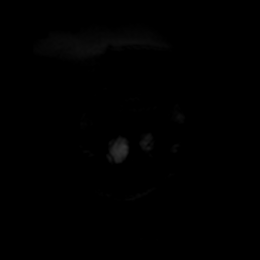

[Series 8: DWI · coronal · 4.0mm · 0.88mm/px · 2 of 32 slices shown (4 of 4)]
[im 1/32]
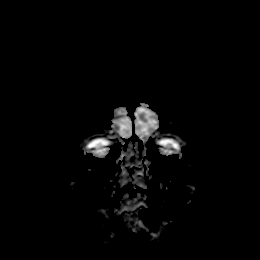
[im 32/32]
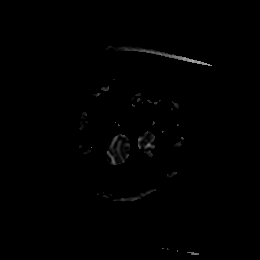

[Series 9: T1 · sagittal · 5.0mm · 0.75mm/px · 2 of 23 slices shown]
[im 1/23]
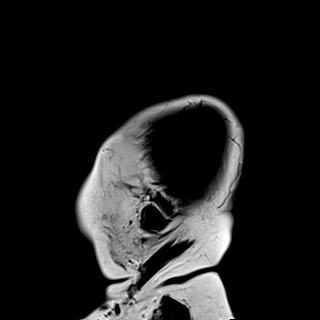
[im 23/23]
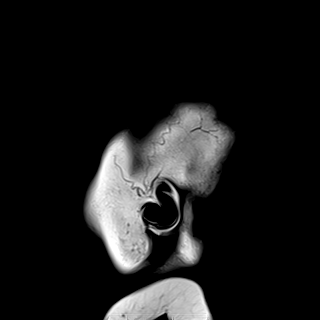

[Series 10: T2 · axial · 5.0mm · 0.72mm/px · z∈[-69,+75]mm · 2 of 25 slices shown (1 of 2)]
[im 1/25]
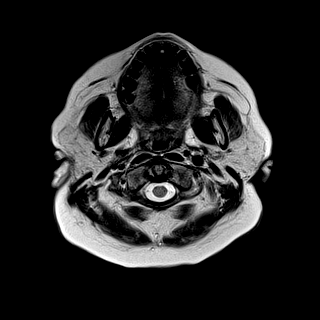
[im 25/25]
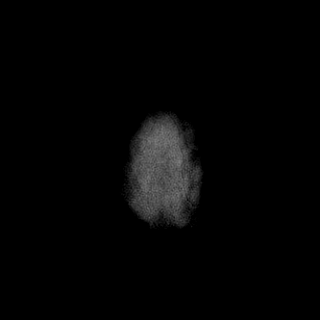

[Series 11: FLAIR · axial · 5.0mm · 0.45mm/px · z∈[-68,+76]mm · 2 of 25 slices shown]
[im 1/25]
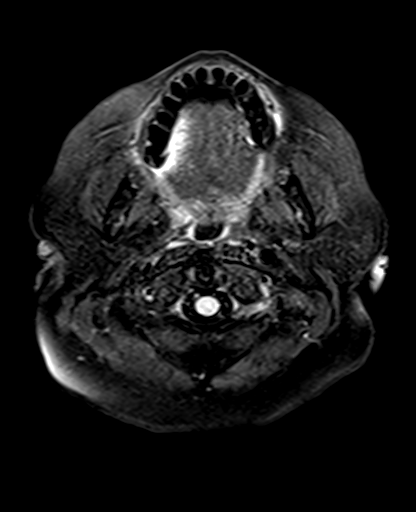
[im 25/25]
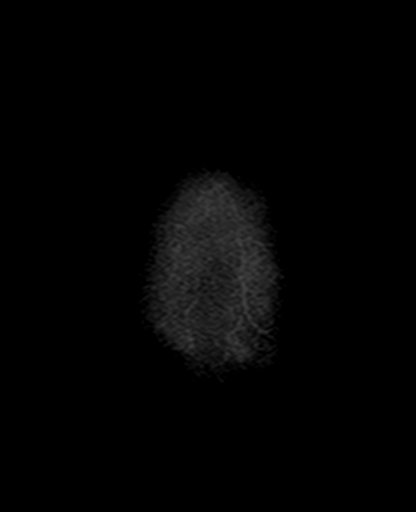

[Series 12: mag_images · axial · 3.0mm · 0.90mm/px · z∈[-83,+94]mm · 5 of 60 slices shown]
[im 1/60]
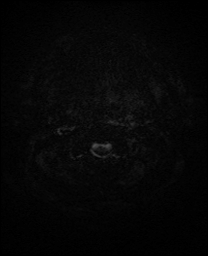
[im 15/60]
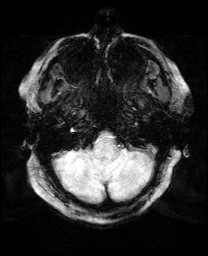
[im 30/60]
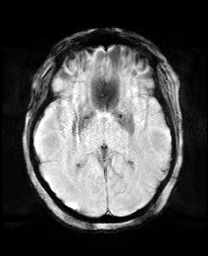
[im 45/60]
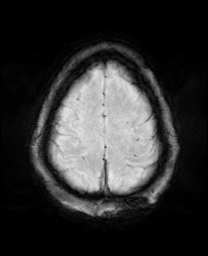
[im 60/60]
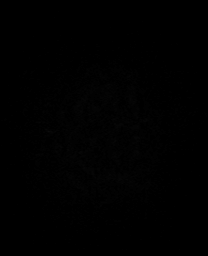

[Series 13: pha_images · axial · 3.0mm · 0.90mm/px · z∈[-80,+91]mm · 4 of 58 slices shown]
[im 1/58]
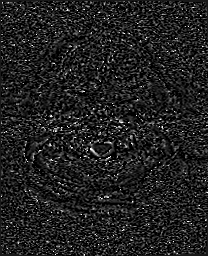
[im 20/58]
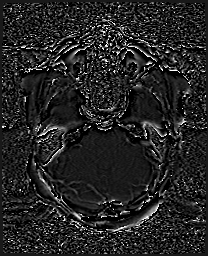
[im 39/58]
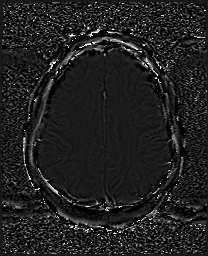
[im 58/58]
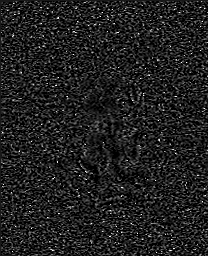

[Series 14: swi_images · axial · 3.0mm · 0.90mm/px · z∈[-83,+94]mm · 5 of 60 slices shown]
[im 1/60]
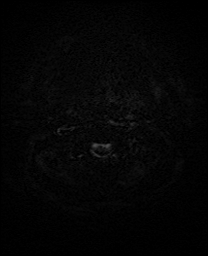
[im 15/60]
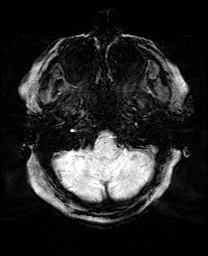
[im 30/60]
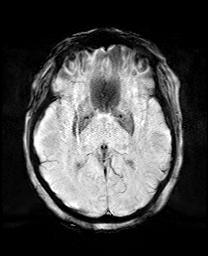
[im 45/60]
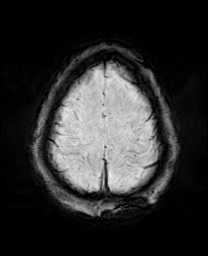
[im 60/60]
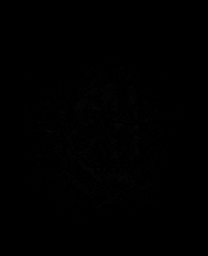

[Series 15: mip_images(sw) · axial · 24.0mm · 0.90mm/px · z∈[-72,+83]mm · 4 of 53 slices shown]
[im 1/53]
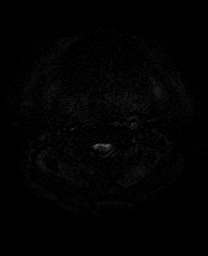
[im 18/53]
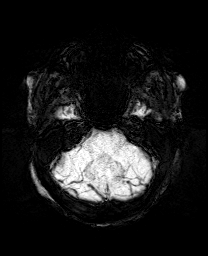
[im 35/53]
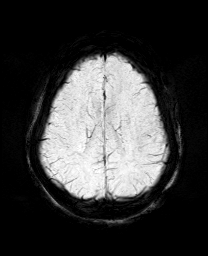
[im 53/53]
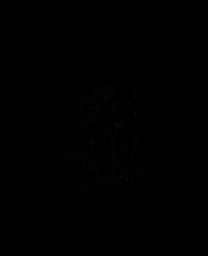

[Series 17: T2 · coronal · 5.0mm · 0.34mm/px · 2 of 29 slices shown (2 of 2)]
[im 1/29]
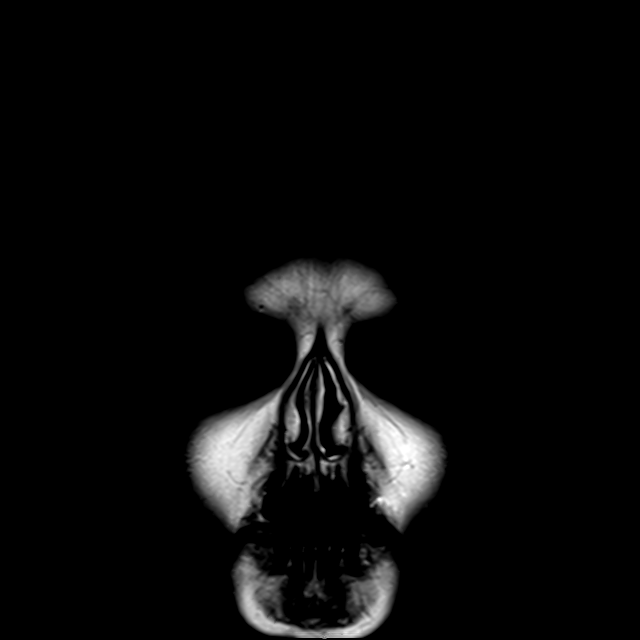
[im 29/29]
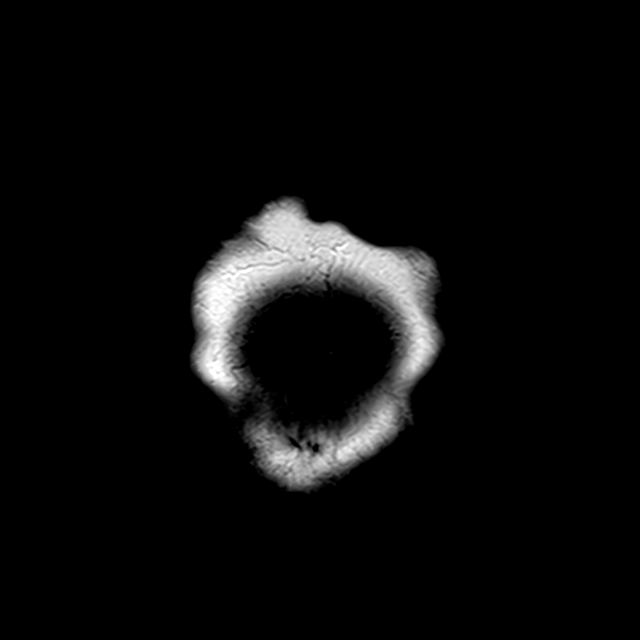

[43 of 48 positions shown; findings below may reference images not displayed]

FINDINGS: Brain: Acute infarct right parietal white matter shows mild
progression since the prior MRI. Infarct measures approximately 1
cm. No other areas of acute infarct. Mild chronic white matter
changes consistent with chronic ischemia.

Negative for hemorrhage or mass.

Vascular: Normal arterial flow voids

Skull and upper cervical spine: Negative

Sinuses/Orbits: Negative

Other: None
IMPRESSION: Acute infarct right parietal white matter, with progression since
01/17/2019.

## 2021-01-16 ENCOUNTER — Other Ambulatory Visit: Payer: Self-pay | Admitting: Cardiovascular Disease

## 2021-01-16 DIAGNOSIS — R519 Headache, unspecified: Secondary | ICD-10-CM

## 2021-02-01 ENCOUNTER — Other Ambulatory Visit: Payer: Medicaid Other

## 2021-03-12 ENCOUNTER — Other Ambulatory Visit: Payer: Self-pay

## 2021-03-12 ENCOUNTER — Telehealth: Payer: Self-pay | Admitting: Diagnostic Neuroimaging

## 2021-03-12 ENCOUNTER — Encounter: Payer: Self-pay | Admitting: *Deleted

## 2021-03-12 ENCOUNTER — Ambulatory Visit (INDEPENDENT_AMBULATORY_CARE_PROVIDER_SITE_OTHER): Payer: Medicare Other | Admitting: Diagnostic Neuroimaging

## 2021-03-12 VITALS — BP 145/76 | HR 75 | Ht 63.0 in | Wt 199.0 lb

## 2021-03-12 DIAGNOSIS — R6884 Jaw pain: Secondary | ICD-10-CM

## 2021-03-12 DIAGNOSIS — R202 Paresthesia of skin: Secondary | ICD-10-CM

## 2021-03-12 DIAGNOSIS — R200 Anesthesia of skin: Secondary | ICD-10-CM

## 2021-03-12 DIAGNOSIS — R519 Headache, unspecified: Secondary | ICD-10-CM

## 2021-03-12 DIAGNOSIS — G500 Trigeminal neuralgia: Secondary | ICD-10-CM

## 2021-03-12 MED ORDER — CARBAMAZEPINE 100 MG PO CHEW: 100.0000 mg | CHEWABLE_TABLET | Freq: Two times a day (BID) | ORAL | 6 refills | Status: DC

## 2021-03-12 NOTE — Telephone Encounter (Signed)
Medicare/medicaid order sent to [facility]. [facility] they will reach out to the patient to schedule.  

## 2021-03-12 NOTE — Patient Instructions (Addendum)
-   check MRI brain / trigeminal protocol - start carbamazepine 100mg  twice a day x 1-2 weeks; then increase to 200mg  twice a day - reduce gabapentin to 300mg  at bedtime x 1 week, then stop

## 2021-03-12 NOTE — Progress Notes (Signed)
[facility]  PATIENT: Victoria Cox DOB: 1961-01-12  REFERRING CLINICIAN: [name], MD HISTORY FROM: patient (via daughter interpreting) REASON FOR VISIT: new consult    HISTORICAL  CHIEF COMPLAINT:  Chief Complaint  Patient presents with   Trigeminal Neuralgia    Rm 6 with daughter hiral (also interpreting,weaver signed)  Pt is having R sided facial shooting pain since May. Anything she does like eating or brushing teeth makes it worse.     HISTORY OF PRESENT ILLNESS:   60 year old female with history of seronegative rheumatoid arthritis, hypertension, diabetes, here for evaluation of right facial pain.  Symptoms started in May 2022 with intermittent, brief shocklike lancinating pain in her right face mainly in her right maxillary, right nasal, right eye and right forehead regions.  Pain radiates to her right scalp.  Symptoms triggered by talking, touching her face, drinking or eating.  Symptoms last for 10 to 15 seconds with intense pain and then subside.  She has tearing from her right eye when she has severe pain.  No prodromal accidents injuries or traumas.  She has been to ENT, dentist, rheumatologist and PCP and no specific cause has been found.  Patient was tried on gabapentin 3 mg 3 times a day without relief.  She tried over-the-counter Tylenol without relief.  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     REVIEW OF SYSTEMS: Full 14 system review of systems performed and negative with exception of: As per HPI.  ALLERGIES: No Known Allergies  HOME MEDICATIONS: Outpatient Medications Prior to Visit  Medication Sig Dispense Refill   acetaminophen (TYLENOL) 500 MG tablet Take 500 mg by mouth as needed for moderate pain or headache.      Ascorbic Acid (VITAMIN C) 100 MG tablet Take 100 mg by mouth daily.      aspirin 325 MG tablet Take 1 tablet (325 mg total) by mouth daily.     atorvastatin (LIPITOR) 20 MG tablet Take 1 tablet (20 mg total) by mouth daily at 6 PM. 30 tablet 3   Biotin 1 MG CAPS Take by mouth.     Cholecalciferol 100 MCG (4000 UT) CAPS Take by mouth.     clopidogrel (PLAVIX) 75 MG tablet Take 1 tablet (75 mg total) by mouth daily. 30 tablet 3   famotidine (PEPCID) 20 MG tablet Take 1 tablet (20 mg total) by mouth at bedtime.     folic acid (FOLVITE) 1 MG tablet Take 2 mg by mouth daily.      gabapentin (NEURONTIN) 300 MG capsule Take 300 mg by mouth 3 (three) times daily.     glucose blood (FREESTYLE LITE) test strip Use to test blood sugar twice daily Dx: E11.9 100 each 12   guaiFENesin-dextromethorphan (ROBITUSSIN DM) 100-10 MG/5ML syrup Take 5 mLs by mouth every 4 (four) hours as needed for cough. 118 mL 0   hydroxychloroquine (PLAQUENIL) 200 MG tablet TAKE 2 TABLETS BY MOUTH ONCE DAILY (NEED  PLAQUENIL  EYE  EXAM  FOR  FURTHER  REFILLS)     Iron-Vitamin C (IRON 100/C PO) Take by mouth.     Lancets (FREESTYLE) lancets Use to test blood sugar twice daily. Dx: E11.9 100 each 12   levothyroxine (SYNTHROID) 75 MCG tablet Take 75 mcg by mouth daily.     metFORMIN (GLUCOPHAGE) 1000 MG tablet Take 1,000 mg by mouth 2 (two)  times daily.     methotrexate 2.5 MG tablet Take 8 tablets (20 mg total) by mouth every 7 (seven) days.     metoprolol succinate (TOPROL-XL) 50 MG 24 hr tablet Take 50 mg by mouth 2 (two) times daily at 10 AM and 5 PM.     Multiple Vitamin (MULTIVITAMIN WITH MINERALS) TABS tablet Take 1 tablet by mouth daily.     NOVOLOG MIX 70/30 (70-30) 100 UNIT/ML injection Inject 0.15 mLs (15 Units total) into the skin 2 (two) times daily with a meal. 10 mL 2   pantoprazole (PROTONIX) 40 MG tablet Take 1 tablet (40 mg total) by mouth daily. 30 tablet 1   polyethylene glycol (MIRALAX / GLYCOLAX) 17 g packet Take 17 g by mouth daily. 28 each 3   predniSONE (DELTASONE) 10 MG  tablet Take 4 tablets (40 mg total) by mouth daily before breakfast. After 1 week decrease dose to 30 mg. daily and 1 week after 20 mg. daily. 100 tablet 1   QUEtiapine (SEROQUEL) 25 MG tablet Take 25 mg by mouth at bedtime.     QUEtiapine (SEROQUEL) 50 MG tablet Take 1 tablet (50 mg total) by mouth 2 (two) times daily. 60 tablet 2   telmisartan (MICARDIS) 20 MG tablet Take 20 mg by mouth daily.     docusate sodium (COLACE) 100 MG capsule Take 1 capsule (100 mg total) by mouth 2 (two) times daily. 60 capsule 3   meclizine (ANTIVERT) 25 MG tablet Take 25 mg by mouth 3 (three) times daily as needed for dizziness.     No facility-administered medications prior to visit.    PAST MEDICAL HISTORY: Past Medical History:  Diagnosis Date   Arthritis    Diabetes (HCC)    High blood pressure    Lupus (systemic lupus erythematosus) (HCC)     PAST SURGICAL HISTORY: Past Surgical History:  Procedure Laterality Date   IR ANGIO EXTERNAL CAROTID SEL EXT CAROTID UNI R MOD SED  02/16/2019   IR ANGIO INTRA EXTRACRAN SEL COM CAROTID INNOMINATE BILAT MOD SED  02/16/2019   IR ANGIO VERTEBRAL SEL VERTEBRAL BILAT MOD SED  02/16/2019   RADIOLOGY WITH ANESTHESIA N/A 02/15/2019   Procedure: IR WITH ANESTHESIA;  Surgeon: Julieanne Cotton, MD;  Location: [facility] OR;  Service: Radiology;  Laterality: N/A;    FAMILY HISTORY: Family History  Problem Relation Age of Onset   Hypertension Mother    Diabetes Sister    Hypertension Brother    Throat cancer Brother     SOCIAL HISTORY: Social History   Socioeconomic History   Marital status: Widowed    Spouse name: Not on file   Number of children: 2   Years of education: Not on file   Highest education level: Not on file  Occupational History   Not on file  Tobacco Use   Smoking status: Never   Smokeless tobacco: Never  Substance and Sexual Activity   Alcohol use: No    Alcohol/week: 0.0 standard drinks   Drug use: No   Sexual activity: Yes    Birth  control/protection: Post-menopausal  Other Topics Concern   Not on file  Social History Narrative   Diet, Vegetarian diet   Caffeine, coffee   Married, yes in [address], yes, 2 stories, 4 persons   Pets, no    Current/Past profession, store employer   Exercise, yes walking   Living Will, No    DNR, No    POA/HPOA, No  Social Determinants of Health   Financial Resource Strain: Not on file  Food Insecurity: Not on file  Transportation Needs: Not on file  Physical Activity: Not on file  Stress: Not on file  Social Connections: Not on file  Intimate Partner Violence: Not on file     PHYSICAL EXAM  GENERAL EXAM/CONSTITUTIONAL: Vitals:  Vitals:   03/12/21 0843  BP: (!) 145/76  Pulse: 75  Weight: 199 lb (90.3 kg)  Height: 5\' 3"  (1.6 m)   Body mass index is 35.25 kg/m. Wt Readings from Last 3 Encounters:  03/12/21 199 lb (90.3 kg)  02/22/19 181 lb 10.5 oz (82.4 kg)  02/16/19 198 lb 10.2 oz (90.1 kg)   Patient is in no distress; well developed, nourished and groomed; neck is supple INTERMITTENT SHOCKLIKE PAIN IN THE RIGHT FACE, WITH PATIENT HOLDING HER RIGHT FACE WITH HER HAND, LASTING 10-15 SECOND, AND THEN TEARING OF RIGHT EYE  CARDIOVASCULAR: Examination of carotid arteries is normal; no carotid bruits Regular rate and rhythm, no murmurs Examination of peripheral vascular system by observation and palpation is normal  EYES: Ophthalmoscopic exam of optic discs and posterior segments is normal; no papilledema or hemorrhages No results found.  MUSCULOSKELETAL: Gait, strength, tone, movements noted in Neurologic exam below  NEUROLOGIC: MENTAL STATUS:  No flowsheet data found. awake, alert, oriented to person, place and time recent and remote memory intact normal attention and concentration language fluent, comprehension intact, naming intact fund of knowledge appropriate  CRANIAL NERVE:  2nd - no papilledema on fundoscopic exam 2nd, 3rd, 4th, 6th  - pupils equal and reactive to light, visual fields full to confrontation, extraocular muscles intact, no nystagmus 5th - facial sensation symmetric; HYPERSENS TO PAIN ON RIGHT V1, V2 REGIONS 7th - facial strength symmetric 8th - hearing intact 9th - palate elevates symmetrically, uvula midline 11th - shoulder shrug symmetric 12th - tongue protrusion midline  MOTOR:  normal bulk and tone, full strength in the BUE, BLE  SENSORY:  normal and symmetric to light touch, temperature, vibration  COORDINATION:  finger-nose-finger, fine finger movements normal  REFLEXES:  deep tendon reflexes TRACE and symmetric  GAIT/STATION:  narrow based gait     DIAGNOSTIC DATA (LABS, IMAGING, TESTING) - I reviewed patient records, labs, notes, testing and imaging myself where available.  Lab Results  Component Value Date   WBC 5.7 03/02/2019   HGB 10.9 (L) 03/02/2019   HCT 34.1 (L) 03/02/2019   MCV 86.1 03/02/2019   PLT 345 03/02/2019      Component Value Date/Time   NA 137 03/02/2019 0331   NA 139 11/30/2014 1001   K 4.0 03/02/2019 0331   CL 101 03/02/2019 0331   CO2 24 03/02/2019 0331   GLUCOSE 183 (H) 03/02/2019 0331   BUN 11 03/02/2019 0331   BUN 11 11/30/2014 1001   CREATININE 0.60 03/02/2019 0331   CALCIUM 9.1 03/02/2019 0331   PROT 5.8 (L) 03/02/2019 0331   PROT 7.2 11/30/2014 1001   ALBUMIN 2.7 (L) 03/02/2019 0331   ALBUMIN 4.2 11/30/2014 1001   AST 15 03/02/2019 0331   ALT 42 03/02/2019 0331   ALKPHOS 73 03/02/2019 0331   BILITOT 0.3 03/02/2019 0331   BILITOT 0.2 11/30/2014 1001   GFRNONAA >60 03/02/2019 0331   GFRAA >60 03/02/2019 0331   Lab Results  Component Value Date   CHOL 181 03/02/2019   HDL 52 03/02/2019   LDLCALC 99 03/02/2019   TRIG 149 03/02/2019   CHOLHDL 3.5 03/02/2019  Lab Results  Component Value Date   HGBA1C 7.2 (H) 02/16/2019   Lab Results  Component Value Date   VITAMINB12 924 (H) 02/22/2019   Lab Results  Component Value Date    TSH 1.696 02/21/2019    01/19/21 CT head / maxillofacial No acute intracranial hemorrhage, mass effect, midline shift, or hydrocephalus.  No sign of an acute territorial infarct.  No CT evidence of acute sinusitis.   Questionable mild asymmetric mucosal thickening of the nasopharynx, on the right-hand side.  Rule out mass.   Mild periodontal disease.    ASSESSMENT AND PLAN  60 y.o. year old female here with new onset facial pain since May 2022. Suspect right trigeminal neuralgia.    Dx:  1. Trigeminal neuralgia of right side of face   2. Right facial pain   3. Numbness and tingling of right face   4. Jaw pain        PLAN:  - check MRI brain / trigeminal protocol - start carbamazepine  twice a day x 1-2 weeks; then increase to  twice a day - reduce gabapentin to  at bedtime x 1 week, then stop (not effective)  Orders Placed This Encounter  Procedures   MR BRAIN W WO CONTRAST   MR FACE/TRIGEMINAL WO/W CM   Meds ordered this encounter  Medications   carbamazepine (TEGRETOL) 100 MG chewable tablet    Sig: Chew 1 tablet (100 mg total) by mouth 2 (two) times daily.    Dispense:  60 tablet    Refill:  6   Return in about 6 months (around 09/12/2021).    Suanne Marker, MD 03/12/2021, 9:18 AM Certified in Neurology, Neurophysiology and Neuroimaging  [facility] [address], [address] [place], [place] [address] [phone]

## 2021-03-23 ENCOUNTER — Other Ambulatory Visit: Payer: Self-pay

## 2021-03-23 ENCOUNTER — Ambulatory Visit
Admission: RE | Admit: 2021-03-23 | Discharge: 2021-03-23 | Disposition: A | Discharge status: Home / Self Care | Payer: Medicare Other | Source: Ambulatory Visit | Attending: Diagnostic Neuroimaging | Admitting: Diagnostic Neuroimaging

## 2021-03-23 DIAGNOSIS — R519 Headache, unspecified: Secondary | ICD-10-CM

## 2021-03-23 DIAGNOSIS — R200 Anesthesia of skin: Secondary | ICD-10-CM | POA: Diagnosis not present

## 2021-03-23 DIAGNOSIS — G500 Trigeminal neuralgia: Secondary | ICD-10-CM

## 2021-03-23 DIAGNOSIS — R202 Paresthesia of skin: Secondary | ICD-10-CM | POA: Diagnosis not present

## 2021-03-23 DIAGNOSIS — R6884 Jaw pain: Secondary | ICD-10-CM

## 2021-03-23 MED ORDER — GADOBENATE DIMEGLUMINE 529 MG/ML IV SOLN
20.0000 mL | Freq: Once | INTRAVENOUS | Status: AC | PRN
  Administered 2021-03-23: 20 mL via INTRAVENOUS

## 2021-03-27 ENCOUNTER — Telehealth: Payer: Self-pay

## 2021-03-27 DIAGNOSIS — G500 Trigeminal neuralgia: Secondary | ICD-10-CM

## 2021-03-27 DIAGNOSIS — R519 Headache, unspecified: Secondary | ICD-10-CM

## 2021-03-27 MED ORDER — CARBAMAZEPINE 100 MG PO CHEW: 200.0000 mg | CHEWABLE_TABLET | Freq: Two times a day (BID) | ORAL | 0 refills | Status: DC

## 2021-03-27 NOTE — Telephone Encounter (Addendum)
MRI of the Brain  ----- Message from Suanne Marker, MD sent at 03/27/2021 10:47 AM EDT ----- Unremarkable imaging results. Please call patient. Continue current plan. -[facility]  MRI of the Face/Trigeminal  Penumalli, Vikram R, MD  P Gna-Pod 3 Results Overall unremarkable imaging results. Small blood vessel seen that could contribute to pain, but not definite. Can be managed medically with carbamazepine. If not helping, then may increase CBZ dose. Also could consider neurosurgery consult for surgery / procedure options if pain not getting better.     I called the pt's daughter-in-law Hiral and we discussed results ( ok per dpr). I advised of results and she verbalized understanding. She sts the pt has been doing ok on the Carbamazepine and started the 200 mg bid dose 3 days ago. She reports the pt does still have some break through pain but not severe and not often. Pt is going to continue medication over the next week and then call back to let us know how she is doing. Not pursuing neuro surgery consult at this time, but will think on it.   Hiral asked for update rx for the Carbamazepine 200 mg bid to be sent to the pharmacy ( [facility]) and I have sent. She will CB next week to update on the pt status.

## 2021-04-26 ENCOUNTER — Other Ambulatory Visit: Payer: Self-pay | Admitting: Diagnostic Neuroimaging

## 2021-04-26 DIAGNOSIS — G500 Trigeminal neuralgia: Secondary | ICD-10-CM

## 2021-04-26 DIAGNOSIS — R519 Headache, unspecified: Secondary | ICD-10-CM

## 2021-06-13 ENCOUNTER — Encounter: Payer: Self-pay | Admitting: Diagnostic Neuroimaging

## 2021-06-17 ENCOUNTER — Other Ambulatory Visit: Payer: Self-pay | Admitting: *Deleted

## 2021-06-17 DIAGNOSIS — R519 Headache, unspecified: Secondary | ICD-10-CM

## 2021-06-17 DIAGNOSIS — G500 Trigeminal neuralgia: Secondary | ICD-10-CM

## 2021-06-17 MED ORDER — CARBAMAZEPINE 100 MG PO CHEW: CHEWABLE_TABLET | ORAL | 2 refills | Status: DC

## 2021-07-16 ENCOUNTER — Other Ambulatory Visit: Payer: Self-pay | Admitting: Cardiovascular Disease

## 2021-07-16 ENCOUNTER — Ambulatory Visit
Admission: RE | Admit: 2021-07-16 | Discharge: 2021-07-16 | Disposition: A | Discharge status: Home / Self Care | Payer: Medicare Other | Source: Ambulatory Visit | Attending: Cardiovascular Disease | Admitting: Cardiovascular Disease

## 2021-07-16 DIAGNOSIS — R0602 Shortness of breath: Secondary | ICD-10-CM

## 2021-07-16 DIAGNOSIS — R059 Cough, unspecified: Secondary | ICD-10-CM

## 2021-08-14 ENCOUNTER — Telehealth: Payer: Self-pay | Admitting: *Deleted

## 2021-08-14 NOTE — Telephone Encounter (Signed)
Received fax from [facility] pharmacy re: need to change manufacturer of Carbamezapine. Put on MD desk for approval.

## 2021-08-14 NOTE — Telephone Encounter (Signed)
Dr Marjory Lies signed okay to switch carbamazepine to new manufacturer. Paper faxed to [facility], received confirmation.

## 2021-09-16 ENCOUNTER — Ambulatory Visit (INDEPENDENT_AMBULATORY_CARE_PROVIDER_SITE_OTHER): Payer: Medicare Other | Admitting: Diagnostic Neuroimaging

## 2021-09-16 ENCOUNTER — Encounter: Payer: Self-pay | Admitting: Diagnostic Neuroimaging

## 2021-09-16 DIAGNOSIS — R519 Headache, unspecified: Secondary | ICD-10-CM

## 2021-09-16 DIAGNOSIS — G500 Trigeminal neuralgia: Secondary | ICD-10-CM

## 2021-09-16 MED ORDER — CARBAMAZEPINE 100 MG PO CHEW: 200.0000 mg | CHEWABLE_TABLET | Freq: Two times a day (BID) | ORAL | 4 refills | Status: DC

## 2021-09-16 NOTE — Progress Notes (Signed)
[facility]  PATIENT: Victoria Cox DOB: [birth year]  REFERRING CLINICIAN: Dixie Dials, MD HISTORY FROM: patient (via daughter interpreting) REASON FOR VISIT: follow up   HISTORICAL  CHIEF COMPLAINT:  Chief Complaint  Patient presents with   Trigeminal neuralgia    Rm 6,  6 month FU dgtr-in-law/interpreter- Hiral  "has it off and on, sensitivity at times but better than it was"    HISTORY OF PRESENT ILLNESS:   UPDATE (09/16/21, VRP): Since last visit, doing much better! CBZ working to reduce pain by "98%". Very rare twinge of pain near right side of nose. Able to eat and talk without issue now.   PRIOR HPI: 61 year old female with history of seronegative rheumatoid arthritis, hypertension, diabetes, here for evaluation of right facial pain.  Symptoms started in May 2022 with intermittent, brief shocklike lancinating pain in her right face mainly in her right maxillary, right nasal, right eye and right forehead regions.  Pain radiates to her right scalp.  Symptoms triggered by talking, touching her face, drinking or eating.  Symptoms last for 10 to 15 seconds with intense pain and then subside.  She has tearing from her right eye when she has severe pain.  No prodromal accidents injuries or traumas.  She has been to ENT, dentist, rheumatologist and PCP and no specific cause has been found.  Patient was tried on gabapentin 300mg  3 times a day without relief.  She tried over-the-counter Tylenol without relief.  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     REVIEW OF SYSTEMS: Full 14 system review of systems performed and negative with exception of: As per HPI.  ALLERGIES: No Known Allergies  HOME MEDICATIONS: Outpatient Medications Prior to Visit  Medication Sig Dispense Refill   acetaminophen (TYLENOL) 500 MG tablet  Take 500 mg by mouth as needed for moderate pain or headache.      Ascorbic Acid (VITAMIN C) 100 MG tablet Take 100 mg by mouth daily.     aspirin 325 MG tablet Take 1 tablet (325 mg total) by mouth daily.     atorvastatin (LIPITOR) 20 MG tablet Take 1 tablet (20 mg total) by mouth daily at 6 PM. 30 tablet 3   Biotin 1 MG CAPS Take by mouth.     Cholecalciferol 100 MCG (4000 UT) CAPS Take by mouth.     clopidogrel (PLAVIX) 75 MG tablet Take 1 tablet (75 mg total) by mouth daily. 30 tablet 3   famotidine (PEPCID) 20 MG tablet Take 1 tablet (20 mg total) by mouth at bedtime.     folic acid (FOLVITE) 1 MG tablet Take 2 mg by mouth daily.      gabapentin (NEURONTIN) 300 MG capsule Take 300 mg by mouth 3 (three) times daily.     glucose blood (FREESTYLE LITE) test strip Use to test blood sugar twice daily Dx: E11.9 100 each 12   guaiFENesin-dextromethorphan (ROBITUSSIN DM) 100-10 MG/5ML syrup Take 5 mLs by mouth every 4 (four) hours as needed for cough. 118 mL 0   hydroxychloroquine (PLAQUENIL) 200 MG tablet TAKE 2 TABLETS BY MOUTH ONCE DAILY (NEED  PLAQUENIL  EYE  EXAM  FOR  FURTHER  REFILLS)     Iron-Vitamin C (IRON 100/C PO) Take by mouth.     Lancets (FREESTYLE) lancets Use to test blood sugar twice daily. Dx: E11.9 100 each 12  levothyroxine (SYNTHROID) 75 MCG tablet Take 75 mcg by mouth daily.     metFORMIN (GLUCOPHAGE) 1000 MG tablet Take 1,000 mg by mouth 2 (two) times daily.     methotrexate 2.5 MG tablet Take 8 tablets (20 mg total) by mouth every 7 (seven) days.     metoprolol succinate (TOPROL-XL) 50 MG 24 hr tablet Take 50 mg by mouth 2 (two) times daily at 10 AM and 5 PM.     Multiple Vitamin (MULTIVITAMIN WITH MINERALS) TABS tablet Take 1 tablet by mouth daily.     NOVOLOG MIX 70/30 (70-30) 100 UNIT/ML injection Inject 0.15 mLs (15 Units total) into the skin 2 (two) times daily with a meal. 10 mL 2   pantoprazole (PROTONIX) 40 MG tablet Take 1 tablet (40 mg total) by mouth daily. 30  tablet 1   polyethylene glycol (MIRALAX / GLYCOLAX) 17 g packet Take 17 g by mouth daily. 28 each 3   predniSONE (DELTASONE) 10 MG tablet Take 4 tablets (40 mg total) by mouth daily before breakfast. After 1 week decrease dose to 30 mg. daily and 1 week after 20 mg. daily. 100 tablet 1   QUEtiapine (SEROQUEL) 25 MG tablet Take 25 mg by mouth at bedtime.     QUEtiapine (SEROQUEL) 50 MG tablet Take 1 tablet (50 mg total) by mouth 2 (two) times daily. 60 tablet 2   telmisartan (MICARDIS) 20 MG tablet Take 20 mg by mouth daily.     carbamazepine (TEGRETOL) 100 MG chewable tablet CHEW AND SWALLOW 2 TABLETS BY MOUTH TWICE DAILY 120 tablet 2   No facility-administered medications prior to visit.    PAST MEDICAL HISTORY: Past Medical History:  Diagnosis Date   Arthritis    Diabetes (HCC)    High blood pressure    Lupus (systemic lupus erythematosus) (HCC)    Trigeminal neuralgia     PAST SURGICAL HISTORY: Past Surgical History:  Procedure Laterality Date   IR ANGIO EXTERNAL CAROTID SEL EXT CAROTID UNI R MOD SED  02/16/2019   IR ANGIO INTRA EXTRACRAN SEL COM CAROTID INNOMINATE BILAT MOD SED  02/16/2019   IR ANGIO VERTEBRAL SEL VERTEBRAL BILAT MOD SED  02/16/2019   RADIOLOGY WITH ANESTHESIA N/A 02/15/2019   Procedure: IR WITH ANESTHESIA;  Surgeon: Julieanne Cotton, MD;  Location: [facility] OR;  Service: Radiology;  Laterality: N/A;    FAMILY HISTORY: Family History  Problem Relation Age of Onset   Hypertension Mother    Diabetes Sister    Hypertension Brother    Throat cancer Brother     SOCIAL HISTORY: Social History   Socioeconomic History   Marital status: Widowed    Spouse name: Not on file   Number of children: 2   Years of education: Not on file   Highest education level: Not on file  Occupational History   Not on file  Tobacco Use   Smoking status: Never   Smokeless tobacco: Never  Substance and Sexual Activity   Alcohol use: No    Alcohol/week: 0.0 standard drinks   Drug  use: No   Sexual activity: Yes    Birth control/protection: Post-menopausal  Other Topics Concern   Not on file  Social History Narrative   Diet, Vegetarian diet   Caffeine, coffee   Married, yes in [address], yes, 2 stories, 4 persons   Pets, no    Current/Past profession, store employer   Exercise, yes walking   Living Will, No    DNR, No  POA/HPOA, No       Social Determinants of Radio broadcast assistant Strain: Not on file  Food Insecurity: Not on file  Transportation Needs: Not on file  Physical Activity: Not on file  Stress: Not on file  Social Connections: Not on file  Intimate Partner Violence: Not on file     PHYSICAL EXAM  GENERAL EXAM/CONSTITUTIONAL: Vitals:  Vitals:   09/16/21 1616  BP: 128/73  Pulse: 79  Weight: 198 lb 9.6 oz (90.1 kg)  Height: 5\' 3"  (1.6 m)   Body mass index is 35.18 kg/m. Wt Readings from Last 3 Encounters:  09/16/21 198 lb 9.6 oz (90.1 kg)  03/12/21 199 lb (90.3 kg)  02/22/19 181 lb 10.5 oz (82.4 kg)   Patient is in no distress; well developed, nourished and groomed; neck is supple  CARDIOVASCULAR: Examination of carotid arteries is normal; no carotid bruits Regular rate and rhythm, no murmurs Examination of peripheral vascular system by observation and palpation is normal  EYES: Ophthalmoscopic exam of optic discs and posterior segments is normal; no papilledema or hemorrhages No results found.  MUSCULOSKELETAL: Gait, strength, tone, movements noted in Neurologic exam below  NEUROLOGIC: MENTAL STATUS:  No flowsheet data found. awake, alert, oriented to person, place and time recent and remote memory intact normal attention and concentration language fluent, comprehension intact, naming intact fund of knowledge appropriate  CRANIAL NERVE:  2nd - no papilledema on fundoscopic exam 2nd, 3rd, 4th, 6th - pupils equal and reactive to light, visual fields full to confrontation, extraocular muscles intact, no  nystagmus 5th - facial sensation symmetric 7th - facial strength symmetric 8th - hearing intact 9th - palate elevates symmetrically, uvula midline 11th - shoulder shrug symmetric 12th - tongue protrusion midline  MOTOR:  normal bulk and tone, full strength in the BUE, BLE  SENSORY:  normal and symmetric to light touch, temperature, vibration  COORDINATION:  finger-nose-finger, fine finger movements normal  REFLEXES:  deep tendon reflexes TRACE and symmetric  GAIT/STATION:  narrow based gait     DIAGNOSTIC DATA (LABS, IMAGING, TESTING) - I reviewed patient records, labs, notes, testing and imaging myself where available.  Lab Results  Component Value Date   WBC 5.7 03/02/2019   HGB 10.9 (L) 03/02/2019   HCT 34.1 (L) 03/02/2019   MCV 86.1 03/02/2019   PLT 345 03/02/2019      Component Value Date/Time   NA 137 03/02/2019 0331   NA 139 11/30/2014 1001   K 4.0 03/02/2019 0331   CL 101 03/02/2019 0331   CO2 24 03/02/2019 0331   GLUCOSE 183 (H) 03/02/2019 0331   BUN 11 03/02/2019 0331   BUN 11 11/30/2014 1001   CREATININE 0.60 03/02/2019 0331   CALCIUM 9.1 03/02/2019 0331   PROT 5.8 (L) 03/02/2019 0331   PROT 7.2 11/30/2014 1001   ALBUMIN 2.7 (L) 03/02/2019 0331   ALBUMIN 4.2 11/30/2014 1001   AST 15 03/02/2019 0331   ALT 42 03/02/2019 0331   ALKPHOS 73 03/02/2019 0331   BILITOT 0.3 03/02/2019 0331   BILITOT 0.2 11/30/2014 1001   GFRNONAA >60 03/02/2019 0331   GFRAA >60 03/02/2019 0331   Lab Results  Component Value Date   CHOL 181 03/02/2019   HDL 52 03/02/2019   LDLCALC 99 03/02/2019   TRIG 149 03/02/2019   CHOLHDL 3.5 03/02/2019   Lab Results  Component Value Date   HGBA1C 7.2 (H) 02/16/2019   Lab Results  Component Value Date   VITAMINB12 924 (  H) 02/22/2019   Lab Results  Component Value Date   TSH 1.696 02/21/2019    01/19/21 CT head / maxillofacial No acute intracranial hemorrhage, mass effect, midline shift, or hydrocephalus.  No sign  of an acute territorial infarct.  No CT evidence of acute sinusitis.   Questionable mild asymmetric mucosal thickening of the nasopharynx, on the right-hand side.  Rule out mass.   Mild periodontal disease.    03/23/21 MRI brain - Stable, mild periventricular and subcortical foci of chronic small vessel ischemic disease.   - No acute findings.  03/23/21 MR face / trigeminal (with and without) demonstrating: -Small vascular structures noted in close proximity to right trigeminal nerve cisternal segment.  -No abnormal enhancing or major compressive lesions noted.   ASSESSMENT AND PLAN  61 y.o. year old female here with new onset facial pain since May 2022. Suspect right trigeminal neuralgia.    Dx:  1. Right facial pain   2. Trigeminal neuralgia of right side of face       PLAN:  RIGHT TRIGEMINAL NEURALGIA - carbamazepine 200mg  twice a day (doing well) - monitor sodium level (BMP) with PCP  Meds ordered this encounter  Medications   carbamazepine (TEGRETOL) 100 MG chewable tablet    Sig: Chew 2 tablets (200 mg total) by mouth 2 (two) times daily. CHEW AND SWALLOW 2 TABLETS BY MOUTH TWICE DAILY    Dispense:  360 tablet    Refill:  4   Return in about 1 year (around 09/16/2022) for MyChart visit (15 min).    Penni Bombard, MD [id number], [id number] PM Certified in Neurology, Neurophysiology and Neuroimaging  [facility] [address] [place], [place] [address] [phone]

## 2021-11-11 ENCOUNTER — Telehealth: Payer: Self-pay | Admitting: Diagnostic Neuroimaging

## 2021-11-11 NOTE — Telephone Encounter (Signed)
Called daughter-in-law who confirmed patient is taking carbamazepine 100 mg, 2 tablets twice daily. Last week she saw PCP for a cough, was put on antibiotic (completed), steroid dose pack with 10 mg tablets x 15 days,  is on day 3, Albuterol inhaler as needed,  cough drops, allegra. This pain started last week and in last 4 days it's gotten a lot worse.  I advised will discuss with Dr Leta Baptist, however he may advise she complete steroid taper as it may help reduce her facial pain and not make any medication changes a this point. . She verbalized understanding, appreciation. ? ?

## 2021-11-11 NOTE — Telephone Encounter (Signed)
Pt daughter Hiral(on DPR) wanted to let office know pt is having worse right facial pain, starting last week. Has gotten worse every day.  ?Pt is having difficult eating, talking and swallowing.  ?Pt is still taking carbamazepine (TEGRETOL) 100 MG chewable tablet and it is not helping as much as before.  ?Would like a call from the nurse and will schedule an appt if needed.  ?

## 2021-11-13 NOTE — Telephone Encounter (Signed)
Called daughter and informed her Dr Marjory LiesPenumalli stated patient may increase CBZ to 300mg  twice a day. Advised they try increased dose x 1-2 weeks then let us know how she is doing. A new Rx will be sent in as needed.  She verbalized understanding, appreciation.  ? ?

## 2021-11-27 ENCOUNTER — Encounter: Payer: Self-pay | Admitting: Diagnostic Neuroimaging

## 2021-11-27 ENCOUNTER — Other Ambulatory Visit: Payer: Self-pay | Admitting: *Deleted

## 2021-11-27 DIAGNOSIS — G500 Trigeminal neuralgia: Secondary | ICD-10-CM

## 2021-11-27 DIAGNOSIS — R519 Headache, unspecified: Secondary | ICD-10-CM

## 2021-11-27 MED ORDER — CARBAMAZEPINE 100 MG PO CHEW: 300.0000 mg | CHEWABLE_TABLET | Freq: Two times a day (BID) | ORAL | 5 refills | Status: DC

## 2022-01-01 ENCOUNTER — Encounter: Payer: Self-pay | Admitting: Diagnostic Neuroimaging

## 2022-01-01 NOTE — Telephone Encounter (Signed)
Yes there is room to go up. Increase carbamazepine to 400mg  twice a day.

## 2022-02-18 ENCOUNTER — Other Ambulatory Visit: Payer: Self-pay | Admitting: *Deleted

## 2022-02-18 ENCOUNTER — Encounter: Payer: Self-pay | Admitting: Diagnostic Neuroimaging

## 2022-02-18 DIAGNOSIS — G500 Trigeminal neuralgia: Secondary | ICD-10-CM

## 2022-02-18 DIAGNOSIS — R519 Headache, unspecified: Secondary | ICD-10-CM

## 2022-02-18 MED ORDER — CARBAMAZEPINE 100 MG PO CHEW: 400.0000 mg | CHEWABLE_TABLET | Freq: Three times a day (TID) | ORAL | 2 refills | Status: DC

## 2022-02-18 NOTE — Telephone Encounter (Signed)
-   Increase CBZ to 400mg  three times a day  - May increase gabapentin to 600mg  three times a day after 1-2 weeks (ask PCP) - Consider neurosurgery consult for other procedure / surg options.  , MD 02/18/2022, 4:14 PM Certified in Neurology, Neurophysiology and Neuroimaging  [facility] [address] [place], [address] [place] [phone]

## 2022-04-23 ENCOUNTER — Other Ambulatory Visit (HOSPITAL_COMMUNITY): Payer: Self-pay | Admitting: Cardiovascular Disease

## 2022-04-23 DIAGNOSIS — R079 Chest pain, unspecified: Secondary | ICD-10-CM

## 2022-04-24 ENCOUNTER — Ambulatory Visit (HOSPITAL_COMMUNITY)
Admission: RE | Admit: 2022-04-24 | Discharge: 2022-04-24 | Disposition: A | Discharge status: Home / Self Care | Payer: Medicare Other | Source: Ambulatory Visit | Attending: Cardiovascular Disease | Admitting: Cardiovascular Disease

## 2022-04-24 DIAGNOSIS — E119 Type 2 diabetes mellitus without complications: Secondary | ICD-10-CM | POA: Insufficient documentation

## 2022-04-24 DIAGNOSIS — R079 Chest pain, unspecified: Secondary | ICD-10-CM | POA: Diagnosis not present

## 2022-04-24 DIAGNOSIS — I10 Essential (primary) hypertension: Secondary | ICD-10-CM | POA: Diagnosis not present

## 2022-04-24 LAB — ECHOCARDIOGRAM COMPLETE
AR max vel: 1.55 cm2
AV Area VTI: 1.59 cm2
AV Area mean vel: 1.46 cm2
AV Mean grad: 4 mmHg
AV Peak grad: 7.8 mmHg
Ao pk vel: 1.4 m/s
Area-P 1/2: 4.29 cm2
S' Lateral: 2.8 cm

## 2022-04-24 NOTE — Progress Notes (Signed)
  Echocardiogram 2D Echocardiogram has been performed.  Merrie Roof F 04/24/2022, 4:25 PM

## 2022-04-29 ENCOUNTER — Encounter: Payer: Self-pay | Admitting: Diagnostic Neuroimaging

## 2022-05-13 ENCOUNTER — Other Ambulatory Visit: Payer: Self-pay | Admitting: Diagnostic Neuroimaging

## 2022-05-13 DIAGNOSIS — G500 Trigeminal neuralgia: Secondary | ICD-10-CM

## 2022-05-13 DIAGNOSIS — R519 Headache, unspecified: Secondary | ICD-10-CM

## 2022-07-02 ENCOUNTER — Other Ambulatory Visit: Payer: Self-pay | Admitting: Diagnostic Neuroimaging

## 2022-07-02 DIAGNOSIS — R519 Headache, unspecified: Secondary | ICD-10-CM

## 2022-07-02 DIAGNOSIS — G500 Trigeminal neuralgia: Secondary | ICD-10-CM

## 2022-08-04 ENCOUNTER — Ambulatory Visit
Admission: RE | Admit: 2022-08-04 | Discharge: 2022-08-04 | Disposition: A | Discharge status: Home / Self Care | Payer: Medicare Other | Source: Ambulatory Visit | Attending: Cardiovascular Disease | Admitting: Cardiovascular Disease

## 2022-08-04 ENCOUNTER — Other Ambulatory Visit: Payer: Self-pay | Admitting: Cardiovascular Disease

## 2022-08-04 DIAGNOSIS — R059 Cough, unspecified: Secondary | ICD-10-CM

## 2022-09-16 ENCOUNTER — Telehealth: Payer: Medicare Other | Admitting: Diagnostic Neuroimaging

## 2023-12-15 ENCOUNTER — Ambulatory Visit: Admitting: Podiatry

## 2024-01-05 ENCOUNTER — Ambulatory Visit (INDEPENDENT_AMBULATORY_CARE_PROVIDER_SITE_OTHER): Admitting: Podiatry

## 2024-01-05 ENCOUNTER — Encounter: Payer: Self-pay | Admitting: Podiatry

## 2024-01-05 DIAGNOSIS — M79672 Pain in left foot: Secondary | ICD-10-CM

## 2024-01-05 DIAGNOSIS — B351 Tinea unguium: Secondary | ICD-10-CM | POA: Diagnosis not present

## 2024-01-05 DIAGNOSIS — M79671 Pain in right foot: Secondary | ICD-10-CM

## 2024-01-05 NOTE — Progress Notes (Signed)
 Patient presents for evaluation and treatment of tenderness and some redness around nails feet.  Tenderness around toes with walking and wearing shoes.  Physical exam:  General appearance: Alert, pleasant, and in no acute distress.  Vascular: Pedal pulses: DP 2/4 B/L, PT 2/4 B/L.  Mild edema lower legs bilaterally  Neurological:  No paresthesias or burning noted  Dermatologic:  Nails thickened, disfigured, discolored 1-5 BL with subungual debris.  Redness and hypertrophic nail folds along nail folds bilaterally but no signs of drainage or infection.  Musculoskeletal:     Diagnosis: 1. Painful onychomycotic nails 1 through 5 bilaterally. 2. Pain toes 1 through 5 bilaterally.  Plan: Debrided onychomycotic nails 1 through 5 bilaterally.  Return 3 months

## 2024-01-22 ENCOUNTER — Encounter (HOSPITAL_COMMUNITY): Payer: Self-pay | Admitting: Interventional Radiology

## 2024-04-05 ENCOUNTER — Ambulatory Visit (INDEPENDENT_AMBULATORY_CARE_PROVIDER_SITE_OTHER): Admitting: Podiatry

## 2024-04-05 DIAGNOSIS — M79672 Pain in left foot: Secondary | ICD-10-CM | POA: Diagnosis not present

## 2024-04-05 DIAGNOSIS — M79671 Pain in right foot: Secondary | ICD-10-CM

## 2024-04-05 DIAGNOSIS — B351 Tinea unguium: Secondary | ICD-10-CM

## 2024-04-05 NOTE — Progress Notes (Signed)
 Patient presents for evaluation and treatment of tenderness and some redness around nails feet.  Tenderness around toes with walking and wearing shoes.  Physical exam:  General appearance: Alert, pleasant, and in no acute distress.  Vascular: Pedal pulses: DP 2/4 B/L, PT 2/4 B/L. Mild edema lower legs bilaterally  Neu  Dermatologic:  Nails thickened, disfigured, discolored 1-5 BL with subungual debris.  Redness and hypertrophic nail folds along nail folds bilaterally but no signs of drainage or infection.  Musculoskeletal:     Diagnosis: 1. Painful onychomycotic nails 1 through 5 bilaterally. 2. Pain toes 1 through 5 bilaterally.  Plan: -Debrided onychomycotic nails 1 through 5 bilaterally.  Sharply debrided nails with nail clipper and reduced with a power bur.  Return 3 months [facility]

## 2024-07-06 ENCOUNTER — Ambulatory Visit: Admitting: Podiatry

## 2024-08-03 ENCOUNTER — Ambulatory Visit: Admitting: Podiatry

## 2024-08-19 ENCOUNTER — Encounter: Payer: Self-pay | Admitting: Podiatry

## 2024-08-19 ENCOUNTER — Ambulatory Visit: Admitting: Podiatry

## 2024-08-19 DIAGNOSIS — M79674 Pain in right toe(s): Secondary | ICD-10-CM

## 2024-08-19 DIAGNOSIS — B351 Tinea unguium: Secondary | ICD-10-CM

## 2024-08-19 DIAGNOSIS — M79675 Pain in left toe(s): Secondary | ICD-10-CM

## 2024-08-19 NOTE — Progress Notes (Signed)
 Patient presents for evaluation and treatment of tenderness and some redness around nails feet.  Tenderness around toes with walking and wearing shoes.  Physical exam:  General appearance: Alert, pleasant, and in no acute distress.  Vascular: Pedal pulses: DP 2/4 B/L, PT 2/4 B/L. Mild edema lower legs bilaterally.  Capillary refill time immediate bilaterally  Neurologic:  Dermatologic:  Nails thickened, disfigured, discolored 1-5 BL with subungual debris.  Redness and hypertrophic nail folds along nail folds bilaterally but no signs of drainage or infection.  Musculoskeletal:     Diagnosis: 1. Painful onychomycotic nails 1 through 5 bilaterally. 2. Pain toes 1 through 5 bilaterally.  Plan: -Debrided onychomycotic nails 1 through 5 bilaterally.  Sharply debrided nails with nail clipper and reduced with a power bur.  Return 3 months [facility]

## 2024-11-18 ENCOUNTER — Ambulatory Visit: Admitting: Podiatry
